# Patient Record
Sex: Female | Born: 1960 | Race: White | Hispanic: No | Marital: Married | Smoking: Former smoker
Health system: Southern US, Community
[De-identification: ages and names within clinical notes are randomized; demographics above are authoritative.]

## PROBLEM LIST (undated history)

## (undated) DIAGNOSIS — Z9989 Dependence on other enabling machines and devices: Secondary | ICD-10-CM

## (undated) DIAGNOSIS — T7840XA Allergy, unspecified, initial encounter: Secondary | ICD-10-CM

## (undated) DIAGNOSIS — E785 Hyperlipidemia, unspecified: Secondary | ICD-10-CM

## (undated) DIAGNOSIS — E119 Type 2 diabetes mellitus without complications: Secondary | ICD-10-CM

## (undated) DIAGNOSIS — J45909 Unspecified asthma, uncomplicated: Secondary | ICD-10-CM

## (undated) DIAGNOSIS — G4733 Obstructive sleep apnea (adult) (pediatric): Secondary | ICD-10-CM

## (undated) DIAGNOSIS — E7800 Pure hypercholesterolemia, unspecified: Secondary | ICD-10-CM

## (undated) DIAGNOSIS — R300 Dysuria: Secondary | ICD-10-CM

## (undated) HISTORY — PX: CHOLECYSTECTOMY: SHX55

## (undated) HISTORY — DX: Type 2 diabetes mellitus without complications: E11.9

## (undated) HISTORY — DX: Dependence on other enabling machines and devices: Z99.89

## (undated) HISTORY — DX: Allergy, unspecified, initial encounter: T78.40XA

## (undated) HISTORY — PX: EXPLORATION POST OPERATIVE OPEN HEART: SHX5061

## (undated) HISTORY — PX: KNEE ARTHROSCOPY AND ARTHROTOMY: SUR84

## (undated) HISTORY — PX: APPENDECTOMY: SHX54

## (undated) HISTORY — DX: Hyperlipidemia, unspecified: E78.5

## (undated) HISTORY — DX: Unspecified asthma, uncomplicated: J45.909

## (undated) HISTORY — DX: Obstructive sleep apnea (adult) (pediatric): G47.33

## (undated) HISTORY — PX: TUBAL LIGATION: SHX77

## (undated) MED FILL — OZEMPIC 1MG/DOSE SOPN (3ML=1BOX): 2 MG/1.5ML | 84 days supply | Qty: 9 | Fill #1 | Status: AC

## (undated) MED FILL — EZETIMIBE 10MG TABS: 10 MG | 90 days supply | Qty: 90 | Fill #1 | Status: AC

## (undated) MED FILL — LOSARTAN POTASSIUM 100MG TABS: 100 MG | 90 days supply | Qty: 90 | Fill #0 | Status: AC

## (undated) MED FILL — CIPROFLOXACIN HCL 500MG TABS: 500 MG | 10 days supply | Qty: 20 | Fill #0 | Status: AC

## (undated) MED FILL — LOSARTAN POTASSIUM 50MG TABS: 50 MG | 30 days supply | Qty: 30 | Fill #0 | Status: AC

## (undated) MED FILL — LOSARTAN POTASSIUM 100MG TABS: 100 MG | 30 days supply | Qty: 30 | Fill #0 | Status: AC

## (undated) MED FILL — OZEMPIC 1MG/DOSE SOPN (3ML=1BOX): 2 MG/1.5ML | 84 days supply | Qty: 9 | Fill #0 | Status: AC

## (undated) MED FILL — EZETIMIBE 10MG TABS: 10 MG | 90 days supply | Qty: 90 | Fill #0 | Status: AC

## (undated) MED FILL — METFORMIN HCL ER 500MG TB24: 500 MG | 90 days supply | Qty: 180 | Fill #0 | Status: AC

---

## 2011-09-30 NOTE — Progress Notes (Signed)
Subjective:      Patient ID: Kristina Howard is Kristina Howard 51 y.o. female.    HPI  Patient is here for evaluation of left trigger finger.   Patient works at [employer].    Review of Systems   All other systems reviewed and are negative.        Objective:   Physical Exam   Nursing note and vitals reviewed.  Constitutional: She is oriented to person, place, and time. She appears well-developed and well-nourished.   HENT:   Head: Normocephalic and atraumatic.   Eyes: Conjunctivae and EOM are normal. Pupils are equal, round, and reactive to light. Right eye exhibits no discharge. Left eye exhibits no discharge.   Neck: Normal range of motion. Neck supple.   Cardiovascular: Normal rate and regular rhythm.    Pulmonary/Chest: Effort normal and breath sounds normal. No respiratory distress. She has no wheezes.   Musculoskeletal: Normal range of motion. She exhibits no edema and no tenderness.   Neurological: She is alert and oriented to person, place, and time. No cranial nerve deficit. Coordination normal.   Skin: Skin is warm and dry. No rash noted. No erythema.   Psychiatric: She has Sixto Bowdish normal mood and affect. Her behavior is normal. Judgment and thought content normal.     Left trigger thumb at A1 pulley.  Patient has pain and tenderness over the joint with obvious triggering and palpable nodule.    Assessment:      Trigger left thumb at A1 pulley      Plan:      A1 pulley trigger release.  All risks and benefits discussed.   Written informed consent was obtained today in the office.         I confirm that I have personally examined the patient and have reviewed the above note(s) and confirmed the key elements of history, physical exam and progress notes, portions of which were transcribed by Olena Leatherwood directly from my in-room dictation.   Electronically signed by Oleh Genin, MD on 09/30/2011 at 1:46 PM

## 2011-10-06 NOTE — Progress Notes (Signed)
Message received from pt, wants her surgery scheduled in November

## 2011-12-15 NOTE — Telephone Encounter (Signed)
Pt calling requesting a return to work for this Wednesday, states she is recovering very well from her trigger finger release. Letter written as requested, pt will pick up.

## 2011-12-23 NOTE — Progress Notes (Signed)
POST-OP:      Patient is here today s/p release of left thumb trigger Kristina Howard. Pulley on 11/20/203.      Sutures are removed today, incision is clean dry and intact.  Patient has good ROM, she is encouraged to continue using the thumb.  She no longer has any triggering or pain.    She is instructed to call with any problems or concerns, otherwise she will follow up with [place] [employer].       I confirm that I have personally examined the patient and have reviewed the above note(s) and confirmed the key elements of history, physical exam and progress notes, portions of which were transcribed by Olena Leatherwood directly from my in-room dictation.   Electronically signed by Oleh Genin, MD

## 2017-06-19 LAB — RUBELLA ANTIBODY, IGG: Rubella Antibody, IgG: 18 IU/mL

## 2017-06-22 LAB — T-SPOT TB TEST

## 2017-06-22 LAB — VARICELLA ZOSTER ANTIBODY, IGG: Varicella Zoster Ab IgG: 1.53 (ref >1.09)

## 2017-06-22 LAB — RUBEOLA ANTIBODY IGG: Measles Immune (Igg): 2.57 (ref >1.09)

## 2017-06-22 LAB — MUMPS ANTIBODY, IGG: Mumps IgG: 6.99 (ref >1.09)

## 2017-09-16 ENCOUNTER — Inpatient Hospital Stay
Admit: 2017-09-16 | Discharge: 2017-09-16 | Disposition: A | Discharge status: Home / Self Care | Payer: PRIVATE HEALTH INSURANCE | Attending: Emergency Medicine

## 2017-09-16 DIAGNOSIS — S39012A Strain of muscle, fascia and tendon of lower back, initial encounter: Secondary | ICD-10-CM

## 2017-09-16 LAB — URINALYSIS WITH REFLEX TO CULTURE
Bilirubin Urine: NEGATIVE
Glucose, Ur: NEGATIVE
Ketones, Urine: NEGATIVE
Leukocyte Esterase, Urine: NEGATIVE
Nitrite, Urine: NEGATIVE
Protein, UA: NEGATIVE
Specific Gravity, UA: 1.01 (ref 1.005–1.030)
Urine Hgb: NEGATIVE
Urobilinogen, Urine: NORMAL
pH, UA: 6 (ref 5.0–8.0)

## 2017-09-16 MED ORDER — CYCLOBENZAPRINE HCL 10 MG PO TABS
10 MG | ORAL_TABLET | Freq: Three times a day (TID) | ORAL | 0 refills | Status: AC | PRN
Start: 2017-09-16 — End: ?

## 2017-09-16 NOTE — Discharge Instructions (Signed)
Apply ice or heat.  Activity as tolerated.  May continue Advil.  Flexeril as needed.  Return for worsening pain, fever, weakness, numbness, bowel or bladder issues, or if worse in any way.

## 2017-09-16 NOTE — ED Notes (Signed)
Patient arrived to the ED for complaints of L lower back pain. States this started about 3 days ago. Denies any fever, denies any injury. States she has had some frequency that she can think of but no other urinary symptoms. States the pain is intermittent. Has been taking Ibuprofen which seems to help. Resting on stretcher, call light in reach.      Lenard LanceHeather L Lasalle Abee, RN  09/16/17 [phone]

## 2017-09-16 NOTE — ED Provider Notes (Signed)
[facility]  eMERGENCY dEPARTMENT eNCOUnter      Pt Name: Kristina CortiLisa Novosel  MRN: [id number]  Birthdate Jun 17, 1960  Date of evaluation: 09/16/2017      CHIEF COMPLAINT       Chief Complaint   Patient presents with   ??? Back Pain     left lower x3 days         HISTORY OF PRESENT ILLNESS      The patient presents with left lower back pain for the past 3 days.  She does not recall an injury but she does do a lot of lifting and bending at work.  She is also a diabetic but hasn't noticed any issues.  She has not had any known trauma.  She has no prior history of back problems.  She denies dysuria.  The pain is worse with bending and twisting.  She denies weakness or numbness.  She tried some Advil for it.  She denies bowel or bladder issues otherwise.  Nothing makes her symptoms better or worse otherwise.       REVIEW OF SYSTEMS       All systems reviewed and negative unless noted in HPI.  The patient denies fever or constitutional symptoms.    Denies vision change.   Denies any sore throat or rhinorrhea.    Denies any neck pain or stiffness.    Denies chest pain or shortness of breath.    No nausea,  vomiting or diarrhea.    Denies any dysuria.  Denies urinary frequency or hematuria.    Denies musculoskeletal injury.  Pain in left lower back.  Denies any weakness, numbness or focal neurologic deficit.    Denies any skin rash or edema.    No recent psychiatric issues.   No easy bruising or bleeding.   Denies any polyuria, polydypsia or history of immunocompromise.      PAST MEDICAL HISTORY    has a past medical history of Asthma, Diabetes mellitus (HCC), and Hypertension.    SURGICAL HISTORY      has a past surgical history that includes Cesarean section; Tubal ligation; Cardiac surgery (age 57); and Finger trigger release (12/10/11).    CURRENT MEDICATIONS       Previous Medications    CETIRIZINE (ZYRTEC) 10 MG TABLET    Take 10 mg by mouth daily.    EMPAGLIFLOZIN (JARDIANCE) 25 MG TABLET    Take 25 mg  by mouth daily    FLUTICASONE PROPIONATE, INHAL, (FLOVENT IN)    Inhale  into the lungs.    LOSARTAN (COZAAR) 25 MG TABLET    Take 25 mg by mouth daily    METFORMIN (GLUCOPHAGE) 500 MG TABLET    Take 1,000 mg by mouth 2 times daily (with meals)    SITAGLIPTIN (JANUVIA) 25 MG TABLET    Take 25 mg by mouth daily       ALLERGIES     is allergic to codeine; shellfish-derived products; and statins.    FAMILY HISTORY     has no family status information on file.      family history is not on file.    SOCIAL HISTORY      reports that she has quit smoking. She has never used smokeless tobacco. She reports that she drinks alcohol. She reports that she does not use drugs.    PHYSICAL EXAM     INITIAL VITALS:  height is 5\' 3"  (1.6 m) and weight is 81.6 kg (180  lb). Her oral temperature is 97.5 ??F (36.4 ??C). Her blood pressure is 204/98 (abnormal) and her pulse is 54. Her respiration is 15 and oxygen saturation is 100%.      Patient is alert and oriented, in no apparent distress.    HEENT is atraumatic.    Pupils are PERRL at 5 mm with normal extraocular motion.   Mucous membranes moist.    Neck is supple with no lymphadenopathy.  No JVD.  No meningismus.    Heart sounds regular rate and rhythm with no gallops, murmurs, or rubs.    Lungs clear, no wheezes, rales or rhonchi.    Abdomen: soft, nontender with no pain to palpation.  No rebound or guarding.  No CVA tenderness.  Musculoskeletal exam shows no evidence of trauma.  No pain to palpation midline of cervical, thoracic, or lumbar spine.  No skin changes.  Patient locates her pain in the left lumbar area.  No pain in the sciatic notch.  No pain with straight leg raise.  Normal stool pulses.  Skin: no rash or edema.    Neurological exam reveals cranial nerves 2 through 12 grossly intact.  Patient has equal grips and normal deep tendon reflexes.  No saddle paresthesias.  Psychiatric: no hallucinations or suicidal ideation.   Lymphatics.:  No lymphadenopathy.       DIFFERENTIAL  DIAGNOSIS/ MDM:     Lumbar strain, UA, renal colic    DIAGNOSTIC RESULTS         LABS:  Results for orders placed or performed during the hospital encounter of 09/16/17   Urinalysis Reflex to Culture   Result Value Ref Range    Color, UA YELLOW YELLOW    Turbidity UA CLEAR CLEAR    Glucose, Ur NEGATIVE NEGATIVE    Bilirubin Urine NEGATIVE NEGATIVE    Ketones, Urine NEGATIVE NEGATIVE    Specific Gravity, UA 1.010 1.005 - 1.030    Urine Hgb NEGATIVE NEGATIVE    pH, UA 6.0 5.0 - 8.0    Protein, UA NEGATIVE NEGATIVE    Urobilinogen, Urine Normal Normal    Nitrite, Urine NEGATIVE NEGATIVE    Leukocyte Esterase, Urine NEGATIVE NEGATIVE    Urinalysis Comments       Microscopic exam not performed based on chemical results unless requested in original order.    Urinalysis Comments          Urinalysis Comments       Utilizing a urinalysis as the only screening method to exclude a potential uropathogen can be unreliable in many patient populations.  Rapid screening tests are less sensitive than culture and if UTI is a clinical possibility, culture should be considered despite a negative urinalysis.         EMERGENCY DEPARTMENT COURSE:   Vitals:    Vitals:    09/16/17 0656 09/16/17 0714   BP: (!) 200/82 (!) 204/98   Pulse: 54    Resp: 15    Temp: 97.5 ??F (36.4 ??C)    TempSrc: Oral    SpO2: 100%    Weight: 81.6 kg (180 lb)    Height: 5\' 3"  (1.6 m)      -------------------------  BP: (!) 204/98, Temp: 97.5 ??F (36.4 ??C), Pulse: 54, Resp: 15      Re-evaluation Notes    I do not think that imaging is clinically indicated at this time.  The patient has no evidence of infection.  I'll treat her as a strain of her back with some Flexeril  and she may continue NSAIDs.  The patient has no neurologic deficit.  She is discharged in good condition.      FINAL IMPRESSION      1. Strain of lumbar region, initial encounter          DISPOSITION/PLAN   DISPOSITION Decision To Discharge 09/16/2017 07:25:36 AM      Condition on  Disposition    good    PATIENT REFERRED TO:  Alvina Filbert, APRN - CNP  [address]  [place] [address]  [phone]    In 1 week        DISCHARGE MEDICATIONS:  New Prescriptions    CYCLOBENZAPRINE (FLEXERIL) 10 MG TABLET    Take 1 tablet by mouth 3 times daily as needed for Muscle spasms       (Please note that portions of this note were completed with a voice recognition program.  Efforts were made to edit the dictations but occasionally words are mis-transcribed.)    Luther Bradley,, MD   Attending Emergency Physician       Luther Bradley, MD  09/16/17 [phone]

## 2017-10-15 ENCOUNTER — Ambulatory Visit
Admit: 2017-10-15 | Discharge: 2017-10-15 | Payer: PRIVATE HEALTH INSURANCE | Attending: Family Medicine | Primary: Family Medicine

## 2017-10-15 DIAGNOSIS — E119 Type 2 diabetes mellitus without complications: Secondary | ICD-10-CM

## 2017-10-15 LAB — POCT GLYCOSYLATED HEMOGLOBIN (HGB A1C): Hemoglobin A1C: 6.6 %

## 2017-10-15 MED ORDER — NYSTATIN 100000 UNIT/GM EX CREA
100000 UNIT/GM | CUTANEOUS | 2 refills | Status: AC
Start: 2017-10-15 — End: ?

## 2017-10-15 MED ORDER — OZEMPIC (1 MG/DOSE) 2 MG/1.5ML SC SOPN
2 MG/1.5ML | PEN_INJECTOR | SUBCUTANEOUS | 2 refills | Status: DC
Start: 2017-10-15 — End: 2018-03-16

## 2017-10-15 MED ORDER — FLUCONAZOLE 150 MG PO TABS
150 MG | ORAL_TABLET | Freq: Once | ORAL | 0 refills | Status: AC
Start: 2017-10-15 — End: 2017-10-15

## 2017-10-15 NOTE — Progress Notes (Signed)
[facility]  [facility]  [address]  [address]  [place] [address]  Dept: [phone]      Kristina Howard is a 57 y.o. female who presents today for follow up on her  medical conditions as noted below.      Chief Complaint   Patient presents with   . Establish Care       Patient Active Problem List:     Trigger thumb     Past Medical History:   Diagnosis Date   . Asthma    . Diabetes mellitus (HCC)    . Hypertension       Past Surgical History:   Procedure Laterality Date   . CARDIAC SURGERY  age 51    close hole    . CESAREAN SECTION     . FINGER TRIGGER RELEASE  12/10/11    left thumb   . TUBAL LIGATION       History reviewed. No pertinent family history.    Current Outpatient Medications   Medication Sig Dispense Refill   . aspirin 81 MG tablet Take 81 mg by mouth daily     . triamcinolone (KENALOG) 0.1 % cream Apply 1 g topically 2 times daily Apply topically 2 times daily. As Needed     . clobetasol (TEMOVATE) 0.05 % ointment Apply 1 each topically 2 times daily Apply topically 2 times daily. As needed for rash     . OZEMPIC, 1 MG/DOSE, 2 MG/1.5ML SOPN Inject 1 mg into the skin once a week 1.5 pen 2   . fluconazole (DIFLUCAN) 150 MG tablet Take 1 tablet by mouth once for 1 dose May repeat in 3-5 days, if symptoms persist or recur. 2 tablet 0   . nystatin (MYCOSTATIN) 100000 UNIT/GM cream Apply topically 2 times daily. 1 Tube 2   . losartan (COZAAR) 25 MG tablet Take 25 mg by mouth daily     . metFORMIN (GLUCOPHAGE) 500 MG tablet Take 1,000 mg by mouth 2 times daily (with meals)     . SITagliptin (JANUVIA) 25 MG tablet Take 25 mg by mouth daily     . cetirizine (ZYRTEC) 10 MG tablet Take 10 mg by mouth daily.     . Fluticasone Propionate, Inhal, (FLOVENT IN) Inhale  into the lungs.     . cyclobenzaprine (FLEXERIL) 10 MG tablet Take 1 tablet by mouth 3 times daily as needed for Muscle spasms (Patient not taking: Reported on 10/15/2017) 30 tablet 0     No current  facility-administered medications for this visit.      ALLERGIES:    Allergies   Allergen Reactions   . Codeine Hives   . Shellfish-Derived Products    . Statins        Social History     Tobacco Use   . Smoking status: Former Smoker     Packs/day: 1.00     Years: 15.00     Pack years: 15.00     Types: Cigarettes     Last attempt to quit: 01/20/2002     Years since quitting: 15.7   . Smokeless tobacco: Never Used   Substance Use Topics   . Alcohol use: Yes     Comment: wine every day        No results found for: LDLCALC, LDLCHOLESTEROL, HDL, BUN, CREATININE, GLUCOSE, LABA1C, LABMICR           Subjective:      HPI  He is here today as a new patient to establish care her insurance changed that she had to find a new provider she is diabetic her hemoglobin A1c today is 6.6 which is a slight improvement from before and much better than while back it was a 3  She currently is taking metformin and Januvia she tried a SGLT2 and it gave her terrible ongoing yeast infections  Her blood pressure is well controlled she has not had labs to evaluate lipids in a long time  She states overall she is feeling pretty good she does get a rash underneath her breast area she thought it is because she is allergic to the elastic  And she also occasionally gets a rash between her fingers that can like blisters she uses a steroid cream and it helps.     Review of Systems:     Constitutional: Negative for fever, appetite change and fatigue.        Family social and medical history reviewed and unchanged     HENT: Negative.  Negative for nosebleeds, trouble swallowing and neck pain.    Eyes: Negative for photophobia and visual disturbance.   Respiratory: Negative.  Negative for chest tightness and shortness of breath.    Cardiovascular: Negative.  Negative for chest pain and leg swelling.   Gastrointestinal: Negative.  Negative for abdominal pain and blood in stool.   Endocrine: Negative for cold intolerance and polyuria.   Genitourinary:  Negative for dysuria and hematuria.   Musculoskeletal: Negative.    Skin: Negative for rash.   Allergic/Immunologic: Negative.    Neurological: Negative.  Negative for dizziness, weakness and numbness.   Hematological: Negative.  Negative for adenopathy. Does not bruise/bleed easily.   Psychiatric/Behavioral: Negative for sleep disturbance, dysphoric mood and  decreased concentration. The patient is not nervous/anxious.        Objective:     Physical Exam:     Nursing note and vitals reviewed.  BP (!) 173/89   Pulse 73   Ht 5\' 3"  (1.6 m)   Wt 183 lb (83 kg)   SpO2 95%   BMI 32.42 kg/m   Constitutional: She is oriented to person, place, and time. She   appears well-developed and well-nourished.  HENT:   Head: Normocephalic and atraumatic.    Right Ear: External ear normal. Tympanic membrane is not erythematous. No middle ear effusion.    Left Ear: External ear normal. Tympanic membrane is not erythematous.  No middle ear effusion.   Nose: No mucosal edema.   Mouth/Throat: Oropharynx is clear and moist. No posterior oropharyngeal erythema.    Eyes: Conjunctivae and EOM are normal. Pupils are equal, round, and reactive to light.    Neck: Normal range of motion. Neck supple. No thyromegaly present.   Cardiovascular: Normal rate, regular rhythm and normal heart sounds.    No murmur heard.  Pulmonary/Chest: Effort normal and breath sounds normal. She has no wheezes. Shehas no rales.  Erythematous rash underneath bilateral breasts  Abdominal: Soft. Bowel sounds are normal. She exhibits no distension and no mass.  There is no tenderness. There is no rebound and no guarding.   Genitourinary/Anorectal:deferred  Musculoskeletal: Normal range of motion. She exhibits no edema or tenderness.   Lymphadenopathy: She has no cervical adenopathy.   Neurological: She is alert and oriented to person, place, and time. She has normal reflexes.   Skin: Skin is warm and dry. No rash noted.   Psychiatric: She has a normal mood and  affect. Her   behavior is normal.       Assessment:      1. Type 2 diabetes mellitus without complication, without long-term current use of insulin (HCC)    2. Essential hypertension    3. Seasonal allergic rhinitis due to pollen    4. Candidiasis    5. Well adult exam    6. Vitamin D deficiency    7. Elevated liver enzymes          Plan:      Call or return to clinic prn if these symptoms worsen or fail to improve as anticipated.  I have reviewed the instructions with the patient, answering all questions to her satisfaction.    No follow-ups on file.  Orders Placed This Encounter   Procedures   . CBC Auto Differential     Standing Status:   Future     Standing Expiration Date:   04/15/2018   . Comprehensive Metabolic Panel     Standing Status:   Future     Standing Expiration Date:   04/15/2018   . Lipid Panel     Standing Status:   Future     Standing Expiration Date:   04/15/2018     Order Specific Question:   Is Patient Fasting?/# of Hours     Answer:   yes   . T4, Free     Standing Status:   Future     Standing Expiration Date:   04/15/2018   . TSH without Reflex     Standing Status:   Future     Standing Expiration Date:   04/15/2018   . Thyroid Peroxidase Antibody     Standing Status:   Future     Standing Expiration Date:   04/15/2018   . Vitamin D 25 Hydroxy     Standing Status:   Future     Standing Expiration Date:   10/16/2018   . Microalbumin, Ur     Standing Status:   Future     Standing Expiration Date:   11/14/2017     Orders Placed This Encounter   Medications   . OZEMPIC, 1 MG/DOSE, 2 MG/1.5ML SOPN     Sig: Inject 1 mg into the skin once a week     Dispense:  1.5 pen     Refill:  2   . fluconazole (DIFLUCAN) 150 MG tablet     Sig: Take 1 tablet by mouth once for 1 dose May repeat in 3-5 days, if symptoms persist or recur.     Dispense:  2 tablet     Refill:  0   . nystatin (MYCOSTATIN) 100000 UNIT/GM cream     Sig: Apply topically 2 times daily.     Dispense:  1 Tube     Refill:  2     Discussed  additional medication and went over instructions on how to use for diabetes  She is to follow-up in the office in 3 months  And get her laboratory studies done  Electronically signed by Eyvonne Mechanic, DO on 10/15/2017 at 1:11 PM

## 2017-10-22 ENCOUNTER — Inpatient Hospital Stay: Payer: PRIVATE HEALTH INSURANCE | Primary: Family Medicine

## 2017-10-22 DIAGNOSIS — E119 Type 2 diabetes mellitus without complications: Secondary | ICD-10-CM

## 2017-10-22 LAB — CBC WITH AUTO DIFFERENTIAL
Absolute Eos #: 0.18 10*3/uL (ref 0.00–0.44)
Absolute Immature Granulocyte: <0.03 10*3/uL (ref 0.00–0.30)
Absolute Lymph #: 1.88 10*3/uL (ref 1.10–3.70)
Absolute Mono #: 0.29 10*3/uL (ref 0.10–1.20)
Basophils Absolute: 0.03 10*3/uL (ref 0.00–0.20)
Basophils: 1 % (ref 0–2)
Eosinophils %: 4 % (ref 1–4)
Hematocrit: 43.8 % (ref 36.3–47.1)
Hemoglobin: 13.7 g/dL (ref 11.9–15.1)
Immature Granulocytes: 0 %
Lymphocytes: 37 % (ref 24–43)
MCH: 30.4 pg (ref 25.2–33.5)
MCHC: 31.3 g/dL (ref 28.4–34.8)
MCV: 97.1 fL (ref 82.6–102.9)
Monocytes: 6 % (ref 3–12)
NRBC Automated: 0 per 100 WBC
RBC: 4.51 m/uL (ref 3.95–5.11)
RDW: 12.2 % (ref 11.8–14.4)
Seg Neutrophils: 53 % (ref 36–65)
Segs Absolute: 2.73 10*3/uL (ref 1.50–8.10)
WBC: 5.1 10*3/uL (ref 3.5–11.3)

## 2017-10-22 LAB — COMPREHENSIVE METABOLIC PANEL
ALT: 43 U/L — ABNORMAL HIGH (ref 5–33)
AST: 37 U/L — ABNORMAL HIGH (ref <32)
Albumin/Globulin Ratio: 1.5 (ref 1.0–2.5)
Albumin: 4.4 g/dL (ref 3.5–5.2)
Alkaline Phosphatase: 78 U/L (ref 35–104)
Anion Gap: 15 mmol/L (ref 9–17)
BUN: 14 mg/dL (ref 6–20)
CO2: 23 mmol/L (ref 20–31)
Calcium: 9.5 mg/dL (ref 8.6–10.4)
Chloride: 103 mmol/L (ref 98–107)
Creatinine: 0.7 mg/dL (ref 0.50–0.90)
GFR African American: >60 mL/min (ref >60)
GFR Non-African American: >60 mL/min (ref >60)
Glucose: 131 mg/dL — ABNORMAL HIGH (ref 70–99)
Potassium: 5 mmol/L (ref 3.7–5.3)
Sodium: 141 mmol/L (ref 135–144)
Total Bilirubin: 0.32 mg/dL (ref 0.3–1.2)
Total Protein: 7.4 g/dL (ref 6.4–8.3)

## 2017-10-22 LAB — LIPID PANEL
Chol/HDL Ratio: 4.8 (ref <5)
Cholesterol: 250 mg/dL — ABNORMAL HIGH (ref <200)
HDL: 52 mg/dL (ref >40)
LDL Cholesterol: 167 mg/dL — ABNORMAL HIGH (ref 0–130)
Triglycerides: 154 mg/dL — ABNORMAL HIGH (ref <150)

## 2017-10-22 LAB — IMMATURE CELLS: Platelet, Fluorescence: ADEQUATE 10*3/uL (ref 138–453)

## 2017-10-22 LAB — TSH: TSH: 4.91 mIU/L (ref 0.30–5.00)

## 2017-10-22 LAB — T4, FREE: Thyroxine, Free: 0.97 ng/dL (ref 0.93–1.70)

## 2017-10-22 LAB — VITAMIN D 25 HYDROXY: Vit D, 25-Hydroxy: 36.3 ng/mL (ref 30.0–100.0)

## 2017-10-22 NOTE — Other (Signed)
Elevated cholesterol, need to start on medication and recheck in 3 m. Med sent to pharmacy.It is not a statin Follow a low fat diet

## 2017-10-23 LAB — THYROID PEROXIDASE ANTIBODY: Thyroid Peroxidase (Tpo) Ab: <10 IU/mL (ref 0.0–35.0)

## 2017-10-27 ENCOUNTER — Encounter

## 2017-10-27 MED ORDER — EZETIMIBE 10 MG PO TABS
10 MG | ORAL_TABLET | Freq: Every day | ORAL | 1 refills | Status: DC
Start: 2017-10-27 — End: 2017-12-21

## 2017-10-29 NOTE — Telephone Encounter (Signed)
From: Cathleen CortiLisa Meir  To: Myer HaffStacy L Bowen, DO  Sent: 10/28/2017 11:12 AM EDT  Subject: Test Results Question    I am wondering why my test result for my cholesterol was not on my, my chart? Considering that I was asked to take a new med for it  thank you Cathleen CortiLisa Vitale.

## 2017-11-06 NOTE — Addendum Note (Signed)
Addended byTommie Raymond on: 11/06/2017 11:12 AM     Modules accepted: Orders

## 2017-12-21 ENCOUNTER — Encounter

## 2017-12-21 MED ORDER — EZETIMIBE 10 MG PO TABS
10 MG | ORAL_TABLET | Freq: Every day | ORAL | 1 refills | Status: DC
Start: 2017-12-21 — End: 2018-07-12

## 2017-12-21 NOTE — Telephone Encounter (Signed)
Misty StanleyLisa Marcussen is calling to request a refill on the following medication(s):  Requested Prescriptions     Pending Prescriptions Disp Refills   ??? ezetimibe (ZETIA) 10 MG tablet 90 tablet 1     Sig: Take 1 tablet by mouth daily       Last Visit Date (If Applicable):  10/15/2017    Next Visit Date:    Visit date not found

## 2018-03-12 ENCOUNTER — Encounter

## 2018-03-12 NOTE — Telephone Encounter (Signed)
Patient requests refill of medication listed and sent to pharmacy listed also.       Date of Last Visit:  Date of Next visit:04/09/2018      WELL CHILD Visit: Last seen:  AWV: Last seen:

## 2018-03-16 MED ORDER — SITAGLIPTIN PHOSPHATE 25 MG PO TABS
25 MG | ORAL_TABLET | Freq: Every day | ORAL | 0 refills | Status: DC
Start: 2018-03-16 — End: 2018-04-09

## 2018-03-16 MED ORDER — OZEMPIC (1 MG/DOSE) 2 MG/1.5ML SC SOPN
2 MG/1.5ML | PEN_INJECTOR | SUBCUTANEOUS | 0 refills | Status: DC
Start: 2018-03-16 — End: 2018-04-09

## 2018-03-16 NOTE — Telephone Encounter (Signed)
Last refill needs a appt

## 2018-03-25 LAB — BE WELL HEALTH SCREEN
3-OH-Cotinine: <2 ng/mL
Chol/HDL Ratio: 4.3 (ref <5)
Cholesterol: 204 mg/dL — ABNORMAL HIGH (ref <200)
Cotinine: <2 ng/mL
Glucose: 70 mg/dL (ref 70–99)
HDL: 48 mg/dL (ref >40)
LDL Cholesterol: 123 mg/dL (ref 0–130)
Nicotine: <2 ng/mL
Triglycerides: 163 mg/dL — ABNORMAL HIGH (ref <150)

## 2018-03-25 MED ORDER — METFORMIN HCL 500 MG PO TABS
500 MG | ORAL_TABLET | Freq: Two times a day (BID) | ORAL | 1 refills | Status: DC
Start: 2018-03-25 — End: 2018-04-09

## 2018-03-25 NOTE — Telephone Encounter (Signed)
lov 10/15/17

## 2018-03-30 NOTE — Telephone Encounter (Signed)
Should be 1000 mg bid or 2000 daily

## 2018-03-30 NOTE — Telephone Encounter (Signed)
I called patient no answer left VM to contact office.

## 2018-03-30 NOTE — Telephone Encounter (Signed)
Pt thought she was talking 2000 mg once daily and script written for 200mg  bid.  Is pt suppose to take 2000 once or twice a day?

## 2018-03-30 NOTE — Telephone Encounter (Signed)
Informed pharmacist

## 2018-04-09 ENCOUNTER — Ambulatory Visit
Admit: 2018-04-09 | Discharge: 2018-04-09 | Payer: PRIVATE HEALTH INSURANCE | Attending: Family Medicine | Primary: Family Medicine

## 2018-04-09 DIAGNOSIS — M25532 Pain in left wrist: Secondary | ICD-10-CM

## 2018-04-09 LAB — POCT MICROALBUMIN
Albumin Urine: 10
Albumin/Creatinine Ratio: <30
Creatinine Ur POCT: 10

## 2018-04-09 LAB — POCT GLYCOSYLATED HEMOGLOBIN (HGB A1C): Hemoglobin A1C: 5.5 %

## 2018-04-09 MED ORDER — METFORMIN HCL ER 500 MG PO TB24
500 MG | ORAL_TABLET | Freq: Every day | ORAL | 1 refills | Status: DC
Start: 2018-04-09 — End: 2018-08-19

## 2018-04-09 MED ORDER — METHYLPREDNISOLONE ACETATE 40 MG/ML IJ SUSP
40 MG/ML | Freq: Once | INTRAMUSCULAR | Status: AC
Start: 2018-04-09 — End: 2018-04-09
  Administered 2018-04-09: 15:00:00 40 mg via INTRAMUSCULAR

## 2018-04-09 MED ORDER — LOSARTAN POTASSIUM 50 MG PO TABS
50 MG | ORAL_TABLET | Freq: Every day | ORAL | 0 refills | Status: DC
Start: 2018-04-09 — End: 2018-05-26

## 2018-04-09 MED ORDER — LOSARTAN POTASSIUM 50 MG PO TABS
50 MG | ORAL_TABLET | Freq: Every day | ORAL | 0 refills | Status: DC
Start: 2018-04-09 — End: 2018-04-09

## 2018-04-09 MED ORDER — OZEMPIC (1 MG/DOSE) 2 MG/1.5ML SC SOPN
21.5 MG/1.5ML | PEN_INJECTOR | SUBCUTANEOUS | 1 refills | Status: DC
Start: 2018-04-09 — End: 2018-07-12

## 2018-04-09 NOTE — Progress Notes (Addendum)
[facility] PHYSICIANS  [facility] FAMILY MEDICINE  [address]  [address]  [place] [address]  Dept: [phone]      Kristina Howard is a 58 y.o. female who presents today for follow up on her  medical conditions as noted below.      Chief Complaint   Patient presents with   ??? 6 Month Follow-Up     DM    ??? Other     wants to discuss new CPAP machine   ??? Wrist Pain     left        Patient Active Problem List:     Trigger thumb     Past Medical History:   Diagnosis Date   ??? Asthma    ??? Diabetes mellitus (HCC)    ??? Hypertension       Past Surgical History:   Procedure Laterality Date   ??? CARDIAC SURGERY  age 52    close hole    ??? CESAREAN SECTION     ??? FINGER TRIGGER RELEASE  12/10/11    left thumb   ??? TUBAL LIGATION       History reviewed. No pertinent family history.    Current Outpatient Medications   Medication Sig Dispense Refill   ??? metFORMIN (GLUCOPHAGE-XR) 500 MG extended release tablet Take 2 tablets by mouth daily (with breakfast) 180 tablet 1   ??? losartan (COZAAR) 50 MG tablet Take 0.5 tablets by mouth daily 30 tablet 0   ??? OZEMPIC, 1 MG/DOSE, 2 MG/1.5ML SOPN Inject 1 mg into the skin once a week 6 pen 1   ??? ezetimibe (ZETIA) 10 MG tablet Take 1 tablet by mouth daily 90 tablet 1   ??? triamcinolone (KENALOG) 0.1 % cream Apply 1 g topically 2 times daily Apply topically 2 times daily. As Needed     ??? clobetasol (TEMOVATE) 0.05 % ointment Apply 1 each topically 2 times daily Apply topically 2 times daily. As needed for rash     ??? nystatin (MYCOSTATIN) 100000 UNIT/GM cream Apply topically 2 times daily. 1 Tube 2   ??? cetirizine (ZYRTEC) 10 MG tablet Take 10 mg by mouth daily.     ??? aspirin 81 MG tablet Take 81 mg by mouth daily     ??? cyclobenzaprine (FLEXERIL) 10 MG tablet Take 1 tablet by mouth 3 times daily as needed for Muscle spasms (Patient not taking: Reported on 10/15/2017) 30 tablet 0   ??? Fluticasone Propionate, Inhal, (FLOVENT IN) Inhale  into the lungs.       No current  facility-administered medications for this visit.      ALLERGIES:    Allergies   Allergen Reactions   ??? Codeine Hives   ??? Shellfish-Derived Products    ??? Statins        Social History     Tobacco Use   ??? Smoking status: Former Smoker     Packs/day: 1.00     Years: 15.00     Pack years: 15.00     Types: Cigarettes     Last attempt to quit: 01/20/2002     Years since quitting: 16.2   ??? Smokeless tobacco: Never Used   Substance Use Topics   ??? Alcohol use: Yes     Comment: wine every day        LDL Cholesterol (mg/dL)   Date Value   00/37/0488 123     HDL (mg/dL)   Date Value   89/16/9450 48  BUN (mg/dL)   Date Value   23/76/2831 14     CREATININE (mg/dL)   Date Value   51/76/1607 0.70     Glucose (mg/dL)   Date Value   37/10/6267 70     Hemoglobin A1C (%)   Date Value   04/09/2018 5.5              Subjective:      HPI  Is here today with several complaints she is having left wrist pain going on for the last several weeks is mostly on her medial wrist  Recheck a diabetes her hemoglobin A1c is 5.5 unfortunately blood pressures were high today and she did have rechecked quite glycerides which were still 163    Review of Systems:     Constitutional: Negative for fever, appetite change and fatigue.        Family social and medical history reviewed and unchanged     HENT: Negative.  Negative for nosebleeds, trouble swallowing and neck pain.    Eyes: Negative for photophobia and visual disturbance.   Respiratory: Negative.  Negative for chest tightness and shortness of breath.    Cardiovascular: Negative.  Negative for chest pain and leg swelling.   Gastrointestinal: Negative.  Negative for abdominal pain and blood in stool.   Endocrine: Negative for cold intolerance and polyuria.   Genitourinary: Negative for dysuria and hematuria.   Musculoskeletal: Negative.    Skin: Negative for rash.   Allergic/Immunologic: Negative.    Neurological: Negative.  Negative for dizziness, weakness and numbness.   Hematological: Negative.   Negative for adenopathy. Does not bruise/bleed easily.   Psychiatric/Behavioral: Negative for sleep disturbance, dysphoric mood and  decreased concentration. The patient is not nervous/anxious.        Objective:     Physical Exam:     Nursing note and vitals reviewed.  BP (!) 181/91    Pulse 73    Wt 174 lb 9.6 oz (79.2 kg)    SpO2 99%    BMI 30.93 kg/m??   Constitutional: She is oriented to person, place, and time. She   appears well-developed and well-nourished.  HENT:   Head: Normocephalic and atraumatic.    Right Ear: External ear normal. Tympanic membrane is not erythematous. No middle ear effusion.    Left Ear: External ear normal. Tympanic membrane is not erythematous.  No middle ear effusion.   Nose: No mucosal edema.   Mouth/Throat: Oropharynx is clear and moist. No posterior oropharyngeal erythema.    Eyes: Conjunctivae and EOM are normal. Pupils are equal, round, and reactive to light.    Neck: Normal range of motion. Neck supple. No thyromegaly present.   Cardiovascular: Normal rate, regular rhythm and normal heart sounds.    No murmur heard.  Pulmonary/Chest: Effort normal and breath sounds normal. She has no wheezes. Shehas no rales.   Abdominal: Soft. Bowel sounds are normal. She exhibits no distension and no mass.  There is no tenderness. There is no rebound and no guarding.   Genitourinary/Anorectal:deferred  Musculoskeletal: Normal range of motion. She exhibits no edema or  +tenderness.med left wrist    Lymphadenopathy: She has no cervical adenopathy.   Neurological: She is alert and oriented to person, place, and time. She has normal reflexes.   Skin: Skin is warm and dry. No rash noted.   Psychiatric: She has a normal mood and affect. Her   behavior is normal.       Assessment:  1. Left wrist pain    2. Type 2 diabetes mellitus without complication, without long-term current use of insulin (HCC)    3. Essential hypertension    4. Seasonal allergic rhinitis due to pollen    5. Candidiasis    6.  Well adult exam    7. Vitamin D deficiency    8. Elevated liver enzymes          Plan:      Call or return to clinic prn if these symptoms worsen or fail to improve as anticipated.  I have reviewed the instructions with the patient, answering all questions to her satisfaction.    No follow-ups on file.  Orders Placed This Encounter   Procedures   ??? POCT glycosylated hemoglobin (Hb A1C)   ??? POCT microalbumin   ??? 20610 - PR DRAIN/INJECT LARGE JOINT/BURSA     Orders Placed This Encounter   Medications   ??? metFORMIN (GLUCOPHAGE-XR) 500 MG extended release tablet     Sig: Take 2 tablets by mouth daily (with breakfast)     Dispense:  180 tablet     Refill:  1   ??? losartan (COZAAR) 50 MG tablet     Sig: Take 0.5 tablets by mouth daily     Dispense:  30 tablet     Refill:  0   ??? OZEMPIC, 1 MG/DOSE, 2 MG/1.5ML SOPN     Sig: Inject 1 mg into the skin once a week     Dispense:  6 pen     Refill:  1   ??? methylPREDNISolone acetate (DEPO-MEDROL) injection 40 mg     Joint injection procedure: Informed consent obtained.wrist left is identified and marked.  Cleansed  with betadine, sprayed with ethyl chloride.  Injected with 9-1, 9 cc of 1% lidocaine, 1 cc  DepoMedrol injected into joint space. Good hemostasis. Pt tolerated well. Dressing applied. Post injection instructions given to patient.   stop Januvia   Increase cozaar  Start fish oil  Electronically signed by Eyvonne Mechanic, DO on 04/09/2018 at 11:47 AM

## 2018-04-09 NOTE — Addendum Note (Signed)
Addended by: Marvis Moeller on: 04/09/2018 11:47 AM     Modules accepted: Orders

## 2018-04-09 NOTE — Telephone Encounter (Signed)
Pharmacy called requesting clarification on losartan dosage.  50 mg whole or half tablet?

## 2018-05-19 ENCOUNTER — Inpatient Hospital Stay: Payer: PRIVATE HEALTH INSURANCE | Primary: Family Medicine

## 2018-05-19 DIAGNOSIS — R0602 Shortness of breath: Secondary | ICD-10-CM

## 2018-05-21 LAB — COVID-19: SARS-CoV-2, PCR: NOT DETECTED

## 2018-05-21 NOTE — Telephone Encounter (Signed)
Called and left VM of Covid test results.

## 2018-05-25 NOTE — Telephone Encounter (Signed)
Kristina Howard is calling to request a refill on the following medication(s):    Last Visit Date (If Applicable):  04/09/2018    Next Visit Date:    Visit date not found    Medication Request:  Requested Prescriptions     Pending Prescriptions Disp Refills   ??? losartan (COZAAR) 50 MG tablet 30 tablet 0     Sig: Take 1 tablet by mouth daily

## 2018-05-26 MED ORDER — LOSARTAN POTASSIUM 50 MG PO TABS
50 MG | ORAL_TABLET | ORAL | 0 refills | Status: DC
Start: 2018-05-26 — End: 2018-05-31

## 2018-05-26 MED ORDER — LOSARTAN POTASSIUM 50 MG PO TABS
50 MG | ORAL_TABLET | Freq: Every day | ORAL | 0 refills | Status: DC
Start: 2018-05-26 — End: 2018-05-31
  Filled 2018-05-27: qty 30, 30d supply, fill #0

## 2018-05-26 NOTE — Telephone Encounter (Signed)
Pt last seen 04/09/18/.

## 2018-05-31 ENCOUNTER — Encounter
Admit: 2018-05-31 | Discharge: 2018-06-01 | Payer: PRIVATE HEALTH INSURANCE | Attending: Family Medicine | Primary: Family Medicine

## 2018-05-31 DIAGNOSIS — I10 Essential (primary) hypertension: Secondary | ICD-10-CM

## 2018-05-31 MED ORDER — LOSARTAN POTASSIUM 100 MG PO TABS
100 MG | ORAL_TABLET | Freq: Every day | ORAL | 5 refills | Status: DC
Start: 2018-05-31 — End: 2018-07-12
  Filled 2018-06-09: qty 30, 30d supply, fill #0

## 2018-05-31 NOTE — Telephone Encounter (Signed)
Message left for patient of negative Covid 19 results.

## 2018-05-31 NOTE — Progress Notes (Signed)
Pt came in for BP check.  Per Dr. Cathey Endow, pt is to double her Losartan.  She will take 100mg  daily.      Discussed with pt she voiced understanding.

## 2018-06-07 ENCOUNTER — Inpatient Hospital Stay: Payer: PRIVATE HEALTH INSURANCE | Primary: Family Medicine

## 2018-06-07 ENCOUNTER — Encounter

## 2018-06-07 DIAGNOSIS — R300 Dysuria: Secondary | ICD-10-CM

## 2018-06-07 NOTE — Telephone Encounter (Signed)
Needs to come in and give a urine specimen

## 2018-06-07 NOTE — Telephone Encounter (Signed)
Order put in and patient called

## 2018-06-07 NOTE — Telephone Encounter (Signed)
Left message for pt to call back and schedule lab ma or let us know if she wants an order for ua to go to the lab

## 2018-06-07 NOTE — Telephone Encounter (Signed)
Pt stated she wanted an order for ua put in so she could go to [facility].

## 2018-06-07 NOTE — Telephone Encounter (Signed)
Pt states she has a significant amount of blood in urine.  Comes out pink with a little bit of pain.  Please advise.

## 2018-06-08 LAB — URINALYSIS WITH REFLEX TO CULTURE
Bilirubin Urine: NEGATIVE
Glucose, Ur: NEGATIVE
Ketones, Urine: NEGATIVE
Nitrite, Urine: NEGATIVE
Specific Gravity, UA: 1.005 (ref 1.005–1.030)
Urobilinogen, Urine: NORMAL
pH, UA: 6 (ref 5.0–8.0)

## 2018-06-08 LAB — MICROSCOPIC URINALYSIS
RBC, UA: 20 /HPF (ref 0–4)
WBC, UA: 20 /HPF (ref 0–5)

## 2018-06-09 LAB — CULTURE, URINE

## 2018-06-09 MED ORDER — CIPROFLOXACIN HCL 500 MG PO TABS
500 MG | ORAL_TABLET | Freq: Two times a day (BID) | ORAL | 0 refills | Status: AC
Start: 2018-06-09 — End: 2018-06-18
  Filled 2018-06-09: qty 20, 10d supply, fill #0

## 2018-07-12 ENCOUNTER — Encounter

## 2018-07-12 MED ORDER — LOSARTAN POTASSIUM 100 MG PO TABS
100 MG | ORAL_TABLET | Freq: Every day | ORAL | 5 refills | Status: DC
Start: 2018-07-12 — End: 2018-08-19
  Filled 2018-07-13: qty 90, 90d supply, fill #0

## 2018-07-12 MED ORDER — EZETIMIBE 10 MG PO TABS
10 MG | ORAL_TABLET | Freq: Every day | ORAL | 1 refills | Status: AC
Start: 2018-07-12 — End: ?
  Filled 2018-07-13: qty 90, 90d supply, fill #0

## 2018-07-12 MED ORDER — OZEMPIC (1 MG/DOSE) 2 MG/1.5ML SC SOPN
2 MG/1.5ML | PEN_INJECTOR | SUBCUTANEOUS | 1 refills | Status: AC
Start: 2018-07-12 — End: 2018-08-11
  Filled 2018-07-14: qty 9, 84d supply, fill #0

## 2018-07-12 NOTE — Telephone Encounter (Signed)
lov 04/09/2018

## 2018-08-19 ENCOUNTER — Telehealth
Admit: 2018-08-19 | Discharge: 2018-08-19 | Payer: PRIVATE HEALTH INSURANCE | Attending: Family Medicine | Primary: Family Medicine

## 2018-08-19 DIAGNOSIS — E119 Type 2 diabetes mellitus without complications: Secondary | ICD-10-CM

## 2018-08-19 MED ORDER — LOSARTAN POTASSIUM 100 MG PO TABS
100 MG | ORAL_TABLET | Freq: Every day | ORAL | 0 refills | Status: AC
Start: 2018-08-19 — End: ?
  Filled 2018-09-14: qty 90, 90d supply, fill #0

## 2018-08-19 MED ORDER — METFORMIN HCL ER 500 MG PO TB24
500 MG | ORAL_TABLET | Freq: Every day | ORAL | 1 refills | Status: AC
Start: 2018-08-19 — End: ?
  Filled 2018-08-19: qty 180, 90d supply, fill #0

## 2018-08-19 MED ORDER — BLOOD GLUCOSE TEST VI STRP
ORAL_STRIP | 0 refills | Status: AC
Start: 2018-08-19 — End: ?

## 2018-08-19 NOTE — Progress Notes (Signed)
08/19/2018    TELEHEALTH EVALUATION -- Audio/Visual (During COVID-19 public health emergency)    HPI:    Kristina Howard (DOB:  12-04-1960) has requested an audio/video evaluation for the following concern(s):    Patient is being seen today in virtual visit evaluation.  She is going to be moving at the end of the month and wanted to make sure she has refills until she finds a new physician.  She states overall she is doing fine she is not really having any complaints or problems    Review of Systems    Prior to Visit Medications    Medication Sig Taking? Authorizing Provider   metFORMIN (GLUCOPHAGE-XR) 500 MG extended release tablet Take 2 tablets by mouth daily (with breakfast) Yes Timmey Lamba L Zela Sobieski, DO   losartan (COZAAR) 100 MG tablet Take 1 tablet by mouth daily Yes Michiko Lineman L Edgard Debord, DO   blood glucose monitor strips Test 2 times a day & as needed for symptoms of irregular blood glucose. Dispense sufficient amount for indicated testing frequency plus additional to accommodate PRN testing needs. Yes Pasha Broad L Violette Morneault, DO   triamcinolone (KENALOG) 0.1 % cream Apply 1 g topically 2 times daily Apply topically 2 times daily. As Needed Yes Historical Provider, MD   nystatin (MYCOSTATIN) 100000 UNIT/GM cream Apply topically 2 times daily. Yes Naylee Frankowski L Elodia Haviland, DO   cetirizine (ZYRTEC) 10 MG tablet Take 10 mg by mouth daily. Yes Historical Provider, MD   Fluticasone Propionate, Inhal, (FLOVENT IN) Inhale  into the lungs. Yes Historical Provider, MD   ezetimibe (ZETIA) 10 MG tablet Take 1 tablet by mouth daily  Patient not taking: Reported on 08/19/2018  Shayley Medlin L Nakaya Mishkin, DO   aspirin 81 MG tablet Take 81 mg by mouth daily  Historical Provider, MD   clobetasol (TEMOVATE) 0.05 % ointment Apply 1 each topically 2 times daily Apply topically 2 times daily. As needed for rash  Historical Provider, MD   cyclobenzaprine (FLEXERIL) 10 MG tablet Take 1 tablet by mouth 3 times daily as needed for Muscle spasms  Patient not taking: Reported on  10/15/2017  Luther BradleyEileen F Baker, MD       Social History     Tobacco Use   ??? Smoking status: Former Smoker     Packs/day: 1.00     Years: 15.00     Pack years: 15.00     Types: Cigarettes     Last attempt to quit: 01/20/2002     Years since quitting: 16.5   ??? Smokeless tobacco: Never Used   Substance Use Topics   ??? Alcohol use: Yes     Comment: wine every day   ??? Drug use: Never        Allergies   Allergen Reactions   ??? Codeine Hives   ??? Shellfish-Derived Products    ??? Statins    ,   Past Medical History:   Diagnosis Date   ??? Asthma    ??? Diabetes mellitus (HCC)    ??? Hypertension    ,   Past Surgical History:   Procedure Laterality Date   ??? CARDIAC SURGERY  age 61    close hole    ??? CESAREAN SECTION     ??? FINGER TRIGGER RELEASE  12/10/11    left thumb   ??? TUBAL LIGATION     ,   Social History     Tobacco Use   ??? Smoking status: Former Smoker     Packs/day: 1.00  Years: 15.00     Pack years: 15.00     Types: Cigarettes     Last attempt to quit: 01/20/2002     Years since quitting: 16.5   ??? Smokeless tobacco: Never Used   Substance Use Topics   ??? Alcohol use: Yes     Comment: wine every day   ??? Drug use: Never   , History reviewed. No pertinent family history.    PHYSICAL EXAMINATION:  [ INSTRUCTIONS:  "[x] " Indicates a positive item  "[] " Indicates a negative item  -- DELETE ALL ITEMS NOT EXAMINED]  Vital Signs: (As obtained by patient/caregiver or practitioner observation)    Blood pressure-  Heart rate-    Respiratory rate-    Temperature-  Pulse oximetry-     Constitutional: [x]  Appears well-developed and well-nourished [x]  No apparent distress      []  Abnormal-   Mental status  [x]  Alert and awake  [x]  Oriented to person/place/time [x] Able to follow commands      Eyes:  EOM    [x]   Normal  []  Abnormal-  Sclera  [x]   Normal  []  Abnormal -         Discharge [x]   None visible  []  Abnormal -    HENT:   [x]  Normocephalic, atraumatic.  []  Abnormal   []  Mouth/Throat: Mucous membranes are moist.     External Ears [x]  Normal  []   Abnormal-     Neck: [x]  No visualized mass     Pulmonary/Chest: [x]  Respiratory effort normal.  [x]  No visualized signs of difficulty breathing or respiratory distress        []  Abnormal-      Musculoskeletal:   [x]  Normal gait with no signs of ataxia         [x]  Normal range of motion of neck        []  Abnormal-       Neurological:        [x]  No Facial Asymmetry (Cranial nerve 7 motor function) (limited exam to video visit)          [x]  No gaze palsy        []  Abnormal-         Skin:        []  No significant exanthematous lesions or discoloration noted on facial skin         []  Abnormal-            Psychiatric:       [x]  Normal Affect [x]  No Hallucinations        []  Abnormal-     Other pertinent observable physical exam findings-     ASSESSMENT/PLAN:  1. Type 2 diabetes mellitus without complication, without long-term current use of insulin ([place])    2. Essential hypertension    3. Hypercholesteremia    4. Seasonal allergic rhinitis due to pollen    5. Vitamin D deficiency      No orders of the defined types were placed in this encounter.    Requested Prescriptions     Signed Prescriptions Disp Refills   ??? metFORMIN (GLUCOPHAGE-XR) 500 MG extended release tablet 180 tablet 1     Sig: Take 2 tablets by mouth daily (with breakfast)   ??? losartan (COZAAR) 100 MG tablet 90 tablet 0     Sig: Take 1 tablet by mouth daily   ??? blood glucose monitor strips 100 strip 0     Sig: Test 2 times a day & as  needed for symptoms of irregular blood glucose. Dispense sufficient amount for indicated testing frequency plus additional to accommodate PRN testing needs.         Return if symptoms worsen or fail to improve.    Kristina CortiLisa Howard is a 58 y.o. female being evaluated by a Virtual Visit (video visit) encounter to address concerns as mentioned above.  A caregiver was present when appropriate. Due to this being a Scientist, research (medical)TeleHealth encounter (During COVID-19 public health emergency), evaluation of the following organ systems was limited:  Vitals/Constitutional/EENT/Resp/CV/GI/GU/MS/Neuro/Skin/Heme-Lymph-Imm.  Pursuant to the emergency declaration under the [facility] Act and the [employer] Emergencies Act, 1135 waiver authority and the [employer]onavirus Preparedness and [employer] Supplemental Appropriations Act, this Virtual Visit was conducted with patient's (and/or legal guardian's) consent, to reduce the patient's risk of exposure to COVID-19 and provide necessary medical care.  The patient (and/or legal guardian) has also been advised to contact this office for worsening conditions or problems, and seek emergency medical treatment and/or call 911 if deemed necessary.     Patient identification was verified at the start of the visit: Yes    Total time spent on this encounter: Not billed by time    Services were provided through a video synchronous discussion virtually to substitute for in-person clinic visit. Patient and provider were located at their individual homes.    --Eyvonne MechanicStacy L Quadre Bristol, DO on 08/19/2018 at 10:20 AM    An electronic signature was used to authenticate this note.

## 2018-09-14 MED FILL — EZETIMIBE 10MG TABS: 10 mg | 90 days supply | Qty: 90 | Fill #1 | Status: AC

## 2018-09-20 MED FILL — OZEMPIC 1MG/DOSE SOPN (3ML=1BOX): 2 MG/1.5ML | 84 days supply | Qty: 9 | Fill #1 | Status: AC

## 2018-12-31 ENCOUNTER — Ambulatory Visit: Payer: Federal, State, Local not specified - PPO | Admitting: Family Medicine

## 2018-12-31 ENCOUNTER — Other Ambulatory Visit: Payer: Self-pay

## 2018-12-31 ENCOUNTER — Encounter: Payer: Self-pay | Admitting: Family Medicine

## 2018-12-31 ENCOUNTER — Encounter (INDEPENDENT_AMBULATORY_CARE_PROVIDER_SITE_OTHER): Payer: Self-pay

## 2018-12-31 VITALS — BP 130/70 | HR 71 | Temp 98.5°F | Ht 63.0 in | Wt 161.4 lb

## 2018-12-31 DIAGNOSIS — Z124 Encounter for screening for malignant neoplasm of cervix: Secondary | ICD-10-CM | POA: Diagnosis not present

## 2018-12-31 DIAGNOSIS — I10 Essential (primary) hypertension: Secondary | ICD-10-CM | POA: Diagnosis not present

## 2018-12-31 DIAGNOSIS — R7989 Other specified abnormal findings of blood chemistry: Secondary | ICD-10-CM

## 2018-12-31 DIAGNOSIS — Z1231 Encounter for screening mammogram for malignant neoplasm of breast: Secondary | ICD-10-CM

## 2018-12-31 DIAGNOSIS — E119 Type 2 diabetes mellitus without complications: Secondary | ICD-10-CM

## 2018-12-31 DIAGNOSIS — E785 Hyperlipidemia, unspecified: Secondary | ICD-10-CM

## 2018-12-31 MED ORDER — METFORMIN HCL 500 MG PO TABS: 1000.0000 mg | ORAL_TABLET | Freq: Every day | ORAL | 2 refills | Status: DC

## 2018-12-31 MED ORDER — LOSARTAN POTASSIUM 100 MG PO TABS: 100.0000 mg | ORAL_TABLET | Freq: Every day | ORAL | 2 refills | Status: DC

## 2018-12-31 MED ORDER — EZETIMIBE 10 MG PO TABS: 10.0000 mg | ORAL_TABLET | Freq: Every day | ORAL | 2 refills | Status: DC

## 2018-12-31 NOTE — Patient Instructions (Addendum)
  I appreciate the opportunity to provide you with care for your health and wellness. Today we discussed: establish care  Follow up: Late March for Annual -vision, no pap   Labs 1 week before at [facility] all your medications as ordered. Call and or message on MyChart about DM injectable medication  I hope you have a wonderful, happy, safe, and healthy Holiday Season! See you in the New Year :)  Please continue to practice social distancing to keep you, your family, and our community safe.  If you must go out, please wear a mask and practice good handwashing.  It was a pleasure to see you and I look forward to continuing to work together on your health and well-being. Please do not hesitate to call the office if you need care or have questions about your care.  Have a wonderful day and week. With Gratitude, Cherly Beach, DNP, AGNP-BC

## 2018-12-31 NOTE — Progress Notes (Signed)
Subjective:     Patient ID: Sophia Robbins, female   DOB: 1960-10-18, 58 y.o.   MRN: [id number]  Sophia Robbins presents for Establish Care and Diabetes (Type II )  Sophia Robbins is a 59 year old female patient who is recently moved down from [place].  Her husband was recently became a patient of mine as well.  She presents today to establish care so that she can have a PCP locally.  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  Reports that she has lost 25 pounds in the last year and a half.  Is trying to take better care of herself and eat overall better diet.  She has an extensive history that includes but is not limited to obstructive sleep apnea with CPAP use, kidney stones, murmur, rash, diabetes, high cholesterol.  Reports getting her flu shot at work.  Is in need of mammogram and Pap smear.  Unsure of HIV or hepatitis C screening.  Will need to get release form to review her records.  Reports that her last annual visit was in March.  She would like to get that set up and get her lab work ordered for that time as well.  Does see a dentist and eye doctor regularly.  Today patient denies signs and symptoms of COVID 19 infection including fever, chills, cough, shortness of breath, and headache.  Past Medical, Surgical, Social History, Allergies, and Medications have been Reviewed. Past Medical History:  Diagnosis Date  . Allergy    Past Surgical History:  Procedure Laterality Date  . CESAREAN SECTION    . CHOLECYSTECTOMY    . EXPLORATION POST OPERATIVE OPEN HEART    . KNEE ARTHROSCOPY AND ARTHROTOMY Left   . TUBAL LIGATION     Social History   Socioeconomic History  . Marital status: Married    Spouse name: Not on file  . Number of children: Not on file  . Years of education: Not on file  . Highest education level: Not on file  Occupational History  . Occupation: Dietitian    Comment: [employer]  Tobacco Use  . Smoking status: Not on file  . Smokeless tobacco:  Never Used  Substance and Sexual Activity  . Alcohol use: Yes    Alcohol/week: 1.0 standard drinks    Types: 1 Glasses of wine per week    Comment: occasional  . Drug use: Not Currently  . Sexual activity: Yes  Other Topics Concern  . Not on file  Social History Narrative  . Not on file   Social Determinants of Health   Financial Resource Strain:   . Difficulty of Paying Living Expenses: Not on file  Food Insecurity:   . Worried About Charity fundraiser in the Last Year: Not on file  . Ran Out of Food in the Last Year: Not on file  Transportation Needs:   . Lack of Transportation (Medical): Not on file  . Lack of Transportation (Non-Medical): Not on file  Physical Activity:   . Days of Exercise per Week: Not on file  . Minutes of Exercise per Session: Not on file  Stress:   . Feeling of Stress : Not on file  Social Connections:   . Frequency of Communication with Friends and Family: Not on file  . Frequency of Social Gatherings with Friends and Family: Not on file  . Attends Religious Services: Not on file  . Active Member of Clubs or Organizations: Not on file  .  Attends Banker Meetings: Not on file  . Marital Status: Not on file  Intimate Partner Violence:   . Fear of Current or Ex-Partner: Not on file  . Emotionally Abused: Not on file  . Physically Abused: Not on file  . Sexually Abused: Not on file    Outpatient Encounter Medications as of 12/31/2018  Medication Sig  . cetirizine (ZYRTEC) 10 MG tablet Take 10 mg by mouth daily.  . clobetasol ointment (TEMOVATE) 0.05 % Apply 1 application topically 2 (two) times daily as needed.  . cyclobenzaprine (FLEXERIL) 10 MG tablet Take 10 mg by mouth 3 (three) times daily as needed for muscle spasms.  Marland Kitchen ezetimibe (ZETIA) 10 MG tablet Take 10 mg by mouth daily.  . fluticasone (FLOVENT HFA) 110 MCG/ACT inhaler Inhale into the lungs 2 (two) times daily.  Marland Kitchen losartan (COZAAR) 100 MG tablet Take 100 mg by mouth  daily.  . metFORMIN (GLUCOPHAGE) 500 MG tablet Take 1,000 mg by mouth daily with breakfast.  . nystatin cream (MYCOSTATIN) Apply 1 application topically 2 (two) times daily.  Marland Kitchen triamcinolone cream (KENALOG) 0.1 % Apply 1 application topically 2 (two) times daily as needed.   No facility-administered encounter medications on file as of 12/31/2018.   Allergies  Allergen Reactions  . Codeine Hives  . Shellfish Allergy   . Statins     Review of Systems  Constitutional: Negative.  Negative for chills and fever.  HENT: Negative.   Eyes: Negative.   Respiratory: Negative.   Cardiovascular: Negative.   Gastrointestinal: Negative.   Endocrine: Negative.   Genitourinary: Negative.   Musculoskeletal: Negative.   Skin: Negative.   Allergic/Immunologic: Negative.   Neurological: Negative.   Hematological: Negative.   Psychiatric/Behavioral: Negative.   All other systems reviewed and are negative.      Objective:     BP 130/70 (BP Location: Left Arm, Patient Position: Sitting, Cuff Size: Normal)   Pulse 71   Temp 98.5 F (36.9 C) (Oral)   Ht 5\' 3"  (1.6 m)   Wt 161 lb 6.4 oz (73.2 kg)   SpO2 99%   BMI 28.59 kg/m   Physical Exam Vitals and nursing note reviewed.  Constitutional:      Appearance: Normal appearance. She is well-developed, well-groomed and overweight.  HENT:     Head: Normocephalic and atraumatic.     Right Ear: External ear normal.     Left Ear: External ear normal.     Nose: Nose normal.     Mouth/Throat:     Mouth: Mucous membranes are moist.     Pharynx: Oropharynx is clear.  Eyes:     General:        Right eye: No discharge.        Left eye: No discharge.     Conjunctiva/sclera: Conjunctivae normal.  Cardiovascular:     Rate and Rhythm: Normal rate and regular rhythm.     Pulses: Normal pulses.     Heart sounds: Normal heart sounds.  Pulmonary:     Effort: Pulmonary effort is normal.     Breath sounds: Normal breath sounds.  Musculoskeletal:         General: Normal range of motion.     Cervical back: Normal range of motion and neck supple.  Skin:    General: Skin is warm.  Neurological:     General: No focal deficit present.     Mental Status: She is alert and oriented to person, place, and time.  Psychiatric:  Attention and Perception: Attention normal.        Mood and Affect: Mood normal.        Speech: Speech normal.        Behavior: Behavior normal. Behavior is cooperative.        Thought Content: Thought content normal.        Cognition and Memory: Cognition normal.        Judgment: Judgment normal.        Assessment and Plan        1. Diabetes mellitus without complication (HCC) Unsure of last A1c at this time.  Is on cholesterol medication.  Has allergy to statins as per her.  Will need to get updated labs.  Provided refills on all medications needed.  Strongly encouraged her to make sure that she is monitoring her blood sugars and keeping them in check.  Will be following these more closely once labs are resulted.  Additionally need to get records from previous office as well.  Reports that she is up-to-date on eye exam.  - losartan (COZAAR) 100 MG tablet; Take 1 tablet (100 mg total) by mouth daily.  Dispense: 90 tablet; Refill: 2 - metFORMIN (GLUCOPHAGE) 500 MG tablet; Take 2 tablets (1,000 mg total) by mouth daily with breakfast.  Dispense: 90 tablet; Refill: 2 - ezetimibe (ZETIA) 10 MG tablet; Take 1 tablet (10 mg total) by mouth daily.  Dispense: 90 tablet; Refill: 2 - COMPLETE METABOLIC PANEL WITH GFR - CBC - Hemoglobin A1c - Lipid panel - TSH - VITAMIN D 25 Hydroxy - Microalbumin / creatinine urine ratio  2. Essential hypertension Baseline control at this time.  Provided with refill.  Labs ordered adjustment will be provided as needed.  Strongly encouraged to continue the lifestyle changes that she has made with weight loss regarding diet and exercise.  - losartan (COZAAR) 100 MG tablet; Take 1  tablet (100 mg total) by mouth daily.  Dispense: 90 tablet; Refill: 2 - COMPLETE METABOLIC PANEL WITH GFR - CBC - TSH  3. Hyperlipidemia, unspecified hyperlipidemia type Maintain heart healthy low-fat diet.  Refills provided labs ordered  - ezetimibe (ZETIA) 10 MG tablet; Take 1 tablet (10 mg total) by mouth daily.  Dispense: 90 tablet; Refill: 2 - Lipid panel  4. Encounter for screening for malignant neoplasm of cervix Ordered - Ambulatory referral to Obstetrics / Gynecology  5. Encounter for screening mammogram for malignant neoplasm of breast Ordered - MM 3D SCREEN BREAST BILATERAL; Future         Follow up: Late March for annual labs 1 week before no Pap  Freddy FinnerHannah M. Kenyan Karnes, DNP, AGNP-BC [facility] [address], [address] [place], [place] [address] Office Hours: Mon-Thurs 8 am-5 pm; Fri 8 am-12 pm Office Phone:  [phone]  Office Fax: [phone]

## 2019-01-03 ENCOUNTER — Telehealth: Payer: Self-pay | Admitting: *Deleted

## 2019-01-03 NOTE — Telephone Encounter (Signed)
Pt called said the medication she was missing at fridays appt was Ozempic 1 mg

## 2019-01-03 NOTE — Telephone Encounter (Signed)
Chart updated

## 2019-01-04 ENCOUNTER — Other Ambulatory Visit: Payer: Self-pay | Admitting: Family Medicine

## 2019-01-04 DIAGNOSIS — E119 Type 2 diabetes mellitus without complications: Secondary | ICD-10-CM

## 2019-01-04 MED ORDER — OZEMPIC (1 MG/DOSE) 2 MG/1.5ML ~~LOC~~ SOPN: 1.0000 mg | PEN_INJECTOR | SUBCUTANEOUS | 3 refills | Status: DC

## 2019-01-04 NOTE — Telephone Encounter (Signed)
Left generic message for patient asking for a call back. Need to know how patient is taking the Ozempic.

## 2019-01-04 NOTE — Telephone Encounter (Signed)
Pt was calling back she is also needs this medication called into the pharmacy as she is out of the medication

## 2019-01-04 NOTE — Telephone Encounter (Signed)
Will you refill the patients Ozempic? They come in pens. One pen last 2 weeks. She take 1mg  weekly.

## 2019-01-04 NOTE — Telephone Encounter (Signed)
Pt called back said she was at work would like a call back

## 2019-01-19 ENCOUNTER — Ambulatory Visit: Payer: Federal, State, Local not specified - PPO | Admitting: Obstetrics and Gynecology

## 2019-01-19 ENCOUNTER — Other Ambulatory Visit (HOSPITAL_COMMUNITY)
Admission: RE | Admit: 2019-01-19 | Discharge: 2019-01-19 | Disposition: A | Discharge status: Home / Self Care | Payer: Federal, State, Local not specified - PPO | Source: Ambulatory Visit | Attending: Obstetrics and Gynecology | Admitting: Obstetrics and Gynecology

## 2019-01-19 ENCOUNTER — Other Ambulatory Visit: Payer: Self-pay

## 2019-01-19 ENCOUNTER — Encounter: Payer: Self-pay | Admitting: Obstetrics and Gynecology

## 2019-01-19 VITALS — BP 149/86 | HR 62 | Ht 63.0 in | Wt 162.4 lb

## 2019-01-19 DIAGNOSIS — Z01419 Encounter for gynecological examination (general) (routine) without abnormal findings: Secondary | ICD-10-CM | POA: Insufficient documentation

## 2019-01-19 DIAGNOSIS — Z1211 Encounter for screening for malignant neoplasm of colon: Secondary | ICD-10-CM | POA: Diagnosis not present

## 2019-01-19 DIAGNOSIS — Z1212 Encounter for screening for malignant neoplasm of rectum: Secondary | ICD-10-CM

## 2019-01-19 LAB — HEMOCCULT GUIAC POC 1CARD (OFFICE): Fecal Occult Blood, POC: NEGATIVE

## 2019-01-19 NOTE — Progress Notes (Signed)
Patient ID: Sophia Robbins, female   DOB: 28-Jan-1960, 58 y.o.   MRN: [id number]  Assessment:  Annual Gyn Exam Plan:  1. Pap smear done, next pap due 5 years 2. Return annually or prn for any gyn concerns 3. Labs by [facility] 3    Annual mammogram advised after age 57 Subjective:  Sophia Robbins is a 58 y.o. female NEW PT REFERRED BY [facility] G1P1001 who presents for annual exam. No LMP recorded. Patient is postmenopausal. The patient has no complaints today but needs a pap. She has never had an abnormal pap in the past and her last pap was about a year and a half ago. She has had one c section and gets regular mammograms. Hx of open heart surgery at age 31.  The following portions of the patient's history were reviewed and updated as appropriate: allergies, current medications, past family history, past medical history, past social history, past surgical history and problem list. Past Medical History:  Diagnosis Date  . Allergy   . Asthma   . Diabetes mellitus without complication (HCC)   . Hyperlipidemia   . OSA on CPAP     Past Surgical History:  Procedure Laterality Date  . CESAREAN SECTION    . CHOLECYSTECTOMY    . EXPLORATION POST OPERATIVE OPEN HEART    . KNEE ARTHROSCOPY AND ARTHROTOMY Left   . TUBAL LIGATION       Current Outpatient Medications:  .  aspirin EC 81 MG tablet, Take 81 mg by mouth daily., Disp: , Rfl:  .  cetirizine (ZYRTEC) 10 MG tablet, Take 10 mg by mouth daily., Disp: , Rfl:  .  clobetasol ointment (TEMOVATE) 0.05 %, Apply 1 application topically 2 (two) times daily as needed., Disp: , Rfl:  .  ezetimibe (ZETIA) 10 MG tablet, Take 1 tablet (10 mg total) by mouth daily., Disp: 90 tablet, Rfl: 2 .  fluticasone (FLOVENT HFA) 110 MCG/ACT inhaler, Inhale into the lungs as needed. , Disp: , Rfl:  .  losartan (COZAAR) 100 MG tablet, Take 1 tablet (100 mg total) by mouth daily., Disp: 90 tablet, Rfl: 2 .  metFORMIN (GLUCOPHAGE) 500 MG tablet, Take 2 tablets (1,000 mg total)  by mouth daily with breakfast., Disp: 90 tablet, Rfl: 2 .  nystatin cream (MYCOSTATIN), Apply 1 application topically as needed. , Disp: , Rfl:  .  Semaglutide, 1 MG/DOSE, (OZEMPIC, 1 MG/DOSE,) 2 MG/1.5ML SOPN, Inject 1 mg into the skin once a week., Disp: 1.5 mL, Rfl: 3 .  triamcinolone cream (KENALOG) 0.1 %, Apply 1 application topically 2 (two) times daily as needed., Disp: , Rfl:   Review of Systems Constitutional: negative Gastrointestinal: negative Genitourinary: negative  Objective:  BP (!) 149/86 (BP Location: Right Arm, Patient Position: Sitting, Cuff Size: Normal)   Pulse 62   Ht 5\' 3"  (1.6 m)   Wt 162 lb 6.4 oz (73.7 kg)   BMI 28.77 kg/m    BMI: Body mass index is 28.77 kg/m.  General Appearance: Alert, appropriate appearance for age. No acute distress HEENT: Grossly normal Neck / Thyroid:  Cardiovascular: RRR; normal S1, S2, no murmur Lungs: CTA bilaterally Back: No CVAT Breast Exam: chronic small 8x8 mm nevus on right upper breast Gastrointestinal: Soft, non-tender, no masses or organomegaly Pelvic Exam: Uterus: well supported, healed c section scar Vagina: normal External genitalia: normal Rectovaginal: not indicated and guaiac negative stool obtained Lymphatic Exam: Non-palpable nodes in neck, clavicular, axillary, or inguinal regions  Skin: no rash or abnormalities Neurologic: Normal  gait and speech, no tremor  Psychiatric: Alert and oriented, appropriate affect.  Urinalysis:Not done  By signing my name below, I, De Burrs, attest that this documentation has been prepared under the direction and in the presence of Jonnie Kind, MD. Electronically Signed: De Burrs, Medical Scribe. 01/19/19. 9:13 AM.  I personally performed the services described in this documentation, which was SCRIBED in my presence. The recorded information has been reviewed and considered accurate. It has been edited as necessary during review. Jonnie Kind, MD

## 2019-01-25 LAB — CYTOLOGY - PAP
Comment: NEGATIVE
Diagnosis: NEGATIVE
High risk HPV: NEGATIVE

## 2019-03-22 ENCOUNTER — Other Ambulatory Visit: Payer: Self-pay

## 2019-03-22 DIAGNOSIS — E119 Type 2 diabetes mellitus without complications: Secondary | ICD-10-CM

## 2019-03-22 MED ORDER — METFORMIN HCL 500 MG PO TABS: 1000.0000 mg | ORAL_TABLET | Freq: Every day | ORAL | 0 refills | Status: DC

## 2019-04-07 DIAGNOSIS — E785 Hyperlipidemia, unspecified: Secondary | ICD-10-CM | POA: Diagnosis not present

## 2019-04-07 DIAGNOSIS — I10 Essential (primary) hypertension: Secondary | ICD-10-CM | POA: Diagnosis not present

## 2019-04-07 DIAGNOSIS — E559 Vitamin D deficiency, unspecified: Secondary | ICD-10-CM | POA: Diagnosis not present

## 2019-04-07 DIAGNOSIS — E119 Type 2 diabetes mellitus without complications: Secondary | ICD-10-CM | POA: Diagnosis not present

## 2019-04-08 LAB — CBC
HCT: 42.2 % (ref 35.0–45.0)
Hemoglobin: 14.3 g/dL (ref 11.7–15.5)
MCH: 30.9 pg (ref 27.0–33.0)
MCHC: 33.9 g/dL (ref 32.0–36.0)
MCV: 91.1 fL (ref 80.0–100.0)
MPV: 10.9 fL (ref 7.5–12.5)
Platelets: 230 10*3/uL (ref 140–400)
RBC: 4.63 10*6/uL (ref 3.80–5.10)
RDW: 12.2 % (ref 11.0–15.0)
WBC: 4.3 10*3/uL (ref 3.8–10.8)

## 2019-04-08 LAB — COMPLETE METABOLIC PANEL WITH GFR
AG Ratio: 2.2 (calc) (ref 1.0–2.5)
ALT: 18 U/L (ref 6–29)
AST: 20 U/L (ref 10–35)
Albumin: 4.7 g/dL (ref 3.6–5.1)
Alkaline phosphatase (APISO): 58 U/L (ref 37–153)
BUN: 12 mg/dL (ref 7–25)
CO2: 30 mmol/L (ref 20–32)
Calcium: 10.1 mg/dL (ref 8.6–10.4)
Chloride: 103 mmol/L (ref 98–110)
Creat: 0.9 mg/dL (ref 0.50–0.99)
GFR, Est African American: 81 mL/min/{1.73_m2} (ref >=60)
GFR, Est Non African American: 69 mL/min/{1.73_m2} (ref >=60)
Globulin: 2.1 g/dL (calc) (ref 1.9–3.7)
Glucose, Bld: 102 mg/dL — ABNORMAL HIGH (ref 65–99)
Potassium: 4.5 mmol/L (ref 3.5–5.3)
Sodium: 141 mmol/L (ref 135–146)
Total Bilirubin: 0.5 mg/dL (ref 0.2–1.2)
Total Protein: 6.8 g/dL (ref 6.1–8.1)

## 2019-04-08 LAB — LIPID PANEL
Cholesterol: 232 mg/dL — ABNORMAL HIGH (ref <200)
HDL: 64 mg/dL (ref >=50)
LDL Cholesterol (Calc): 147 mg/dL (calc) — ABNORMAL HIGH
Non-HDL Cholesterol (Calc): 168 mg/dL (calc) — ABNORMAL HIGH (ref <130)
Total CHOL/HDL Ratio: 3.6 (calc) (ref <5.0)
Triglycerides: 97 mg/dL (ref <150)

## 2019-04-08 LAB — MICROALBUMIN / CREATININE URINE RATIO
Creatinine, Urine: 185 mg/dL (ref 20–275)
Microalb Creat Ratio: 4 mcg/mg creat (ref <30)
Microalb, Ur: 0.7 mg/dL

## 2019-04-08 LAB — HEMOGLOBIN A1C
Hgb A1c MFr Bld: 5.3 % of total Hgb (ref <5.7)
Mean Plasma Glucose: 105 (calc)
eAG (mmol/L): 5.8 (calc)

## 2019-04-08 LAB — VITAMIN D 25 HYDROXY (VIT D DEFICIENCY, FRACTURES): Vit D, 25-Hydroxy: 22 ng/mL — ABNORMAL LOW (ref 30–100)

## 2019-04-08 LAB — TSH: TSH: 4.52 mIU/L — ABNORMAL HIGH (ref 0.40–4.50)

## 2019-04-08 NOTE — Addendum Note (Signed)
Addended by: Abner Greenspan on: 04/08/2019 10:54 AM   Modules accepted: Orders

## 2019-04-14 ENCOUNTER — Ambulatory Visit (INDEPENDENT_AMBULATORY_CARE_PROVIDER_SITE_OTHER): Payer: Federal, State, Local not specified - PPO | Admitting: Family Medicine

## 2019-04-14 ENCOUNTER — Other Ambulatory Visit: Payer: Self-pay

## 2019-04-14 ENCOUNTER — Encounter: Payer: Self-pay | Admitting: Family Medicine

## 2019-04-14 VITALS — BP 130/82 | HR 60 | Resp 16 | Ht 63.0 in | Wt 162.0 lb

## 2019-04-14 DIAGNOSIS — I10 Essential (primary) hypertension: Secondary | ICD-10-CM

## 2019-04-14 DIAGNOSIS — Z0001 Encounter for general adult medical examination with abnormal findings: Secondary | ICD-10-CM | POA: Diagnosis not present

## 2019-04-14 DIAGNOSIS — G4733 Obstructive sleep apnea (adult) (pediatric): Secondary | ICD-10-CM | POA: Diagnosis not present

## 2019-04-14 DIAGNOSIS — E119 Type 2 diabetes mellitus without complications: Secondary | ICD-10-CM

## 2019-04-14 DIAGNOSIS — R7989 Other specified abnormal findings of blood chemistry: Secondary | ICD-10-CM | POA: Insufficient documentation

## 2019-04-14 DIAGNOSIS — E785 Hyperlipidemia, unspecified: Secondary | ICD-10-CM

## 2019-04-14 DIAGNOSIS — E118 Type 2 diabetes mellitus with unspecified complications: Secondary | ICD-10-CM | POA: Insufficient documentation

## 2019-04-14 NOTE — Assessment & Plan Note (Signed)
Needs updated equipment and possible updated test that is been over 5 years.  Referral to neurology.

## 2019-04-14 NOTE — Progress Notes (Signed)
Health Maintenance reviewed -   Immunization History  Administered Date(s) Administered  . Influenza-Unspecified 11/09/2018   Last Pap smear: 12/2018 Last mammogram: Ordered Last colonoscopy: 09/2016 Last DEXA: N/A Dentist: Twice yearly cleaning, one implant  Ophtho: Summer 2020 Exercise: Some: yoga and move around in yard  Other doctors caring for patient include:  Patient Care Team: Perlie Mayo, NP as PCP - General (Family Medicine)  End of Life Discussion:  Patient does not have a living will and medical power of attorney    Code Status: Not on file   Subjective:   HPI  Sophia Robbins is a 59 y.o. female who presents for annual wellness visit and follow-up on chronic medical conditions.  She has the following concerns: No concerns today in the office.  She denies having any skin issues or breakdown.  She denies having any changes in bowel or bladder habits.  Denies having any falls or need of using assistive devices.  Does wear glasses.  Denies having any memory changes.  Reports eating a balanced diet but could do better.  Reports drinking water daily.  Reports sleeping well.  Reports having COVID vaccines-Moderna   Review Of Systems  Review of Systems  Constitutional: Negative.   HENT: Negative.   Eyes: Negative.   Respiratory: Negative.   Cardiovascular: Negative.   Gastrointestinal: Negative.   Endocrine: Negative.   Genitourinary: Negative.   Musculoskeletal: Negative.   Skin: Negative.   Allergic/Immunologic: Negative.   Neurological: Negative.   Hematological: Negative.   Psychiatric/Behavioral: Negative.   All other systems reviewed and are negative.   Objective:   PHYSICAL EXAM:  BP 130/82   Pulse 60   Resp 16   Ht 5\' 3"  (1.6 m)   Wt 162 lb (73.5 kg)   SpO2 97%   BMI 28.70 kg/m    Physical Exam Vitals and nursing note reviewed.  Constitutional:      Appearance: Normal appearance. She is normal weight.  HENT:     Head:  Normocephalic and atraumatic.     Right Ear: Tympanic membrane, ear canal and external ear normal.     Left Ear: Tympanic membrane, ear canal and external ear normal.     Nose: Nose normal.     Mouth/Throat:     Mouth: Mucous membranes are moist.     Pharynx: Oropharynx is clear.  Eyes:     General: No scleral icterus.       Right eye: No discharge.        Left eye: No discharge.     Extraocular Movements: Extraocular movements intact.     Conjunctiva/sclera: Conjunctivae normal.     Pupils: Pupils are equal, round, and reactive to light.     Comments: Glasses  Neck:     Thyroid: No thyroid mass, thyromegaly or thyroid tenderness.  Cardiovascular:     Rate and Rhythm: Normal rate and regular rhythm.     Pulses: Normal pulses.          Radial pulses are 2+ on the right side and 2+ on the left side.       Dorsalis pedis pulses are 2+ on the right side and 2+ on the left side.     Heart sounds: Normal heart sounds.  Pulmonary:     Effort: Pulmonary effort is normal.     Breath sounds: Normal breath sounds.  Abdominal:     General: Abdomen is flat. Bowel sounds are normal.  Palpations: Abdomen is soft.     Tenderness: There is no abdominal tenderness. There is no right CVA tenderness or left CVA tenderness.     Hernia: No hernia is present.  Musculoskeletal:        General: Normal range of motion.     Cervical back: Normal range of motion and neck supple.     Right lower leg: No edema.     Left lower leg: No edema.  Lymphadenopathy:     Cervical: No cervical adenopathy.  Skin:    General: Skin is warm and dry.     Capillary Refill: Capillary refill takes less than 2 seconds.  Neurological:     General: No focal deficit present.     Mental Status: She is alert and oriented to person, place, and time. Mental status is at baseline.     Cranial Nerves: Cranial nerves are intact.     Sensory: Sensation is intact.     Motor: Motor function is intact.     Coordination:  Coordination is intact.     Gait: Gait is intact.     Deep Tendon Reflexes: Reflexes are normal and symmetric.  Psychiatric:        Attention and Perception: Attention and perception normal.        Mood and Affect: Mood and affect normal.        Speech: Speech normal.        Behavior: Behavior normal. Behavior is cooperative.        Thought Content: Thought content normal.        Cognition and Memory: Cognition and memory normal.        Judgment: Judgment normal.     Depression Screening  Depression screen [facility] 2/9 01/19/2019 12/31/2018  Decreased Interest 0 0  Down, Depressed, Hopeless 0 0  PHQ - 2 Score 0 0      Assessment & Plan:   1. Annual visit for general adult medical examination with abnormal findings   2. Diabetes mellitus without complication (HCC)   3. OSA (obstructive sleep apnea)   4. Essential hypertension   5. Hyperlipidemia, unspecified hyperlipidemia type   6. Elevated TSH     Tests ordered Orders Placed This Encounter  Procedures  . CBC  . COMPLETE METABOLIC PANEL WITH GFR  . Hemoglobin A1c  . Lipid panel  . TSH  . Ambulatory referral to Neurology     Plan: Please see assessment and plan per problem list above.  The patient's weight, height, BMI, and visual acuity have been recorded in the chart.  I have made referrals, counseling, and provided education to the patient based on review of the above and I have provided the patient with a written personalized care plan for preventive services.     Freddy Finner, NP   04/14/2019

## 2019-04-14 NOTE — Assessment & Plan Note (Signed)
Elevated cholesterol does not tolerate statins is on Zetia needs to do diet and lifestyle changes discussed today in the office.  Encourage low-fat diet along with exercise.

## 2019-04-14 NOTE — Assessment & Plan Note (Signed)
Discussed monthly self breast exams and yearly mammograms; at least 30 minutes of aerobic activity at least 5 days/week and weight-bearing exercise 2x/week; proper sunscreen use reviewed; healthy diet, including goals of calcium and vitamin D intake and alcohol recommendations (less than or equal to 1 drink/day) reviewed; regular seatbelt use; changing batteries in smoke detectors.  Immunization recommendations discussed.  Colonoscopy recommendations reviewed.  

## 2019-04-14 NOTE — Patient Instructions (Addendum)
I appreciate the opportunity to provide you with care for your health and wellness. Today we discussed: overall health   Follow up: 6 months   Labs 1 week before next appt-fasting Referral today for sleep study  Work note today   Please continue to practice social distancing to keep you, your family, and our community safe.  If you must go out, please wear a mask and practice good handwashing.  It was a pleasure to see you and I look forward to continuing to work together on your health and well-being. Please do not hesitate to call the office if you need care or have questions about your care.  Have a wonderful day and week. With Gratitude, Tereasa Coop, DNP, AGNP-BC  HEALTH MAINTENANCE RECOMMENDATIONS:  It is recommended that you get at least 30 minutes of aerobic exercise at least 5 days/week (for weight loss, you may need as much as 60-90 minutes). This can be any activity that gets your heart rate up. This can be divided in 10-15 minute intervals if needed, but try and build up your endurance at least once a week.  Weight bearing exercise is also recommended twice weekly.  Eat a healthy diet with lots of vegetables, fruits and fiber.  "Colorful" foods have a lot of vitamins (ie green vegetables, tomatoes, red peppers, etc).  Limit sweet tea, regular sodas and alcoholic beverages, all of which has a lot of calories and sugar.  Up to 1 alcoholic drink daily may be beneficial for women (unless trying to lose weight, watch sugars).  Drink a lot of water.  Calcium recommendations are 1200-1500 mg daily (1500 mg for postmenopausal women or women without ovaries), and vitamin D 1000 IU daily.  This should be obtained from diet and/or supplements (vitamins), and calcium should not be taken all at once, but in divided doses.  Monthly self breast exams and yearly mammograms for women over the age of 60 is recommended.  Sunscreen of at least SPF 30 should be used on all sun-exposed parts of  the skin when outside between the hours of 10 am and 4 pm (not just when at beach or pool, but even with exercise, golf, tennis, and yard work!)  Use a sunscreen that says "broad spectrum" so it covers both UVA and UVB rays, and make sure to reapply every 1-2 hours.  Remember to change the batteries in your smoke detectors when changing your clock times in the spring and fall.  Use your seat belt every time you are in a car, and please drive safely and not be distracted with cell phones and texting while driving.

## 2019-04-14 NOTE — Assessment & Plan Note (Signed)
Reorder check of TSH will address and treat as appropriate denies having any signs or symptoms of thyroid issues

## 2019-04-14 NOTE — Assessment & Plan Note (Signed)
Is on Cozaar at this time she is controlled.  Encouraged DASH diet in addition to 30 to 60 minutes of daily exercise release 5 days a week.

## 2019-04-14 NOTE — Assessment & Plan Note (Signed)
Controlled doing well on Metformin continue. Additionally takes Ozempic. Is unable to tolerate statin is on Zetia.  Needs to have better diet control.  Educated on diabetic friendly diet and low-fat diet.

## 2019-04-15 LAB — TSH: TSH: 5.9 mIU/L — ABNORMAL HIGH (ref 0.40–4.50)

## 2019-04-19 ENCOUNTER — Other Ambulatory Visit: Payer: Self-pay | Admitting: Family Medicine

## 2019-04-19 ENCOUNTER — Other Ambulatory Visit: Payer: Self-pay | Admitting: *Deleted

## 2019-04-19 DIAGNOSIS — E039 Hypothyroidism, unspecified: Secondary | ICD-10-CM

## 2019-04-19 DIAGNOSIS — E119 Type 2 diabetes mellitus without complications: Secondary | ICD-10-CM

## 2019-04-19 MED ORDER — OZEMPIC (1 MG/DOSE) 2 MG/1.5ML ~~LOC~~ SOPN: 1.0000 mg | PEN_INJECTOR | SUBCUTANEOUS | 3 refills | Status: DC

## 2019-04-19 MED ORDER — LEVOTHYROXINE SODIUM 25 MCG PO TABS: 25.0000 ug | ORAL_TABLET | Freq: Every day | ORAL | 1 refills | Status: DC

## 2019-04-28 ENCOUNTER — Ambulatory Visit (INDEPENDENT_AMBULATORY_CARE_PROVIDER_SITE_OTHER): Payer: Federal, State, Local not specified - PPO | Admitting: Neurology

## 2019-04-28 ENCOUNTER — Other Ambulatory Visit: Payer: Self-pay

## 2019-04-28 ENCOUNTER — Encounter: Payer: Self-pay | Admitting: Neurology

## 2019-04-28 VITALS — BP 166/76 | HR 58 | Temp 97.2°F | Ht 63.0 in | Wt 163.0 lb

## 2019-04-28 DIAGNOSIS — Z820 Family history of epilepsy and other diseases of the nervous system: Secondary | ICD-10-CM

## 2019-04-28 DIAGNOSIS — G4733 Obstructive sleep apnea (adult) (pediatric): Secondary | ICD-10-CM | POA: Diagnosis not present

## 2019-04-28 DIAGNOSIS — E663 Overweight: Secondary | ICD-10-CM | POA: Diagnosis not present

## 2019-04-28 DIAGNOSIS — R634 Abnormal weight loss: Secondary | ICD-10-CM

## 2019-04-28 DIAGNOSIS — R0683 Snoring: Secondary | ICD-10-CM | POA: Diagnosis not present

## 2019-04-28 DIAGNOSIS — Z9989 Dependence on other enabling machines and devices: Secondary | ICD-10-CM

## 2019-04-28 NOTE — Progress Notes (Signed)
Subjective:    Patient ID: Sophia Robbins is a 59 y.o. female.  HPI     Sophia Age, MD, PhD [facility] [address] [place], [place] [address]  Dear Jarrett Soho,   I saw your patient, Sophia Robbins, upon your kind request, sleep clinic today for initial consultation of her sleep disorder, in particular, evaluation of her prior diagnosis of obstructive sleep apnea.  The patient is unaccompanied today.  As you know, Sophia Robbins is a 59 year old right-handed woman with an underlying medical history of diabetes, asthma, allergies, hyperlipidemia, and overweight state, who was previously diagnosed with obstructive sleep apnea and placed on CPAP therapy.  Prior sleep study results are not available for my review today.  Sleep study testing was over 10 years ago, in [place].  I reviewed your office note from 04/14/2019.  Her Epworth sleepiness score is 8 out of 24, fatigue severity score is 19 out of 63.  She reports that she was diagnosed with mild obstructive sleep apnea.  She has been on CPAP and reports full compliance, she brought her machine but a compliance download was not possible as her machine is older.  She was heavier when she was first diagnosed with obstructive sleep apnea and since she was diagnosed with type 2 diabetes she has lost about 30 pounds.  She was also working night shift at the time and had a daytime sleep study.  She has been on day shift for the past several years.  She lives with her husband.  She works in Barr[employer]'s [employer].  Bedtime is around 11 PM and rise time around 730 or 7:45 AM.  She does not endorse any night to night nocturia or recurrent morning headaches.  Her sister has sleep apnea and uses a CPAP machine as well.  They have no indoor pets, 2 outdoor cats, no TV in the bedroom.  She has 1 grown daughter who is a Designer, jewellery.  Daughter lives in [place].  The patient quit smoking some 20 years ago, she drinks alcohol  with dinner, typically 1 glass of wine, caffeine in the form of coffee, 1 cup in the morning and 1 glass of iced tea with lunch.  She is interested in getting new equipment and motivated to continue with treatment.  She benefited from CPAP therapy when she started using it some 10+ years ago.   Her Past Medical History Is Significant For: Past Medical History:  Diagnosis Date  . Allergy   . Asthma   . Diabetes mellitus without complication ([place])   . Hyperlipidemia   . OSA on CPAP     Her Past Surgical History Is Significant For: Past Surgical History:  Procedure Laterality Date  . CESAREAN SECTION    . CHOLECYSTECTOMY    . EXPLORATION POST OPERATIVE OPEN HEART    . KNEE ARTHROSCOPY AND ARTHROTOMY Left   . TUBAL LIGATION      Her Family History Is Significant For: Family History  Problem Relation Robbins of Onset  . Cancer Mother   . Cancer Father   . COPD Brother   . Diabetes Sister     Her Social History Is Significant For: Social History   Socioeconomic History  . Marital status: Married    Spouse name: Not on file  . Number of children: 1  . Years of education: Not on file  . Highest education level: Some college, no degree  Occupational History  . Occupation: Dietitian  Comment: [employer]  Tobacco Use  . Smoking status: Former Smoker    Types: Cigarettes    Quit date: 06/20/2012    Years since quitting: 6.8  . Smokeless tobacco: Never Used  Substance and Sexual Activity  . Alcohol use: Yes    Alcohol/week: 1.0 standard drinks    Types: 1 Glasses of wine per week    Comment: occasional  . Drug use: Not Currently  . Sexual activity: Yes    Birth control/protection: Surgical    Comment: tubal  Other Topics Concern  . Not on file  Social History Narrative   Lives with husband of 37 years    1 daughter -granddaughter and grandson      Two cats-outside      Enjoys: crafts, Web designer, redoing home       Diet: eat all food groups, avoids milk -little  intolerance   Caffeine: 1 cup coffee, unsweet iced tea   Water: 6 cups daily       Wears seat belt   Smoke detectors at home    Does not use phone while driving    Social Determinants of Health   Financial Resource Strain:   . Difficulty of Paying Living Expenses:   Food Insecurity:   . Worried About Programme researcher, broadcasting/film/video in the Last Year:   . Barista in the Last Year:   Transportation Needs:   . Freight forwarder (Medical):   Marland Kitchen Lack of Transportation (Non-Medical):   Physical Activity:   . Days of Exercise per Week:   . Minutes of Exercise per Session:   Stress:   . Feeling of Stress :   Social Connections:   . Frequency of Communication with Friends and Family:   . Frequency of Social Gatherings with Friends and Family:   . Attends Religious Services:   . Active Member of Clubs or Organizations:   . Attends Banker Meetings:   Marland Kitchen Marital Status:     Her Allergies Are:  Allergies  Allergen Reactions  . Codeine Hives  . Shellfish Allergy   . Statins   :   Her Current Medications Are:  Outpatient Encounter Medications as of 04/28/2019  Medication Sig  . aspirin EC 81 MG tablet Take 81 mg by mouth daily.  . cetirizine (ZYRTEC) 10 MG tablet Take 10 mg by mouth daily.  . clobetasol ointment (TEMOVATE) 0.05 % Apply 1 application topically 2 (two) times daily as needed.  . ezetimibe (ZETIA) 10 MG tablet Take 1 tablet (10 mg total) by mouth daily.  . fluticasone (FLOVENT HFA) 110 MCG/ACT inhaler Inhale into the lungs as needed.   Marland Kitchen levothyroxine (SYNTHROID) 25 MCG tablet Take 1 tablet (25 mcg total) by mouth daily before breakfast.  . losartan (COZAAR) 100 MG tablet Take 1 tablet (100 mg total) by mouth daily.  . metFORMIN (GLUCOPHAGE) 500 MG tablet Take 2 tablets (1,000 mg total) by mouth daily with breakfast.  . nystatin cream (MYCOSTATIN) Apply 1 application topically as needed.   . Semaglutide, 1 MG/DOSE, (OZEMPIC, 1 MG/DOSE,) 2 MG/1.5ML SOPN  Inject 1 mg into the skin once a week.  . triamcinolone cream (KENALOG) 0.1 % Apply 1 application topically 2 (two) times daily as needed.   No facility-administered encounter medications on file as of 04/28/2019.  :  Review of Systems:  Out of a complete 14 point review of systems, all are reviewed and negative with the exception of these symptoms as listed below:  Review of Systems  Neurological:       Pt presents today to discuss getting a new cpap. Pt has a F&P that is about 60 years old. Pt's last sleep study was in [facility], results not available. Pt does snore without her cpap. Pt does not have a local DME. Pt reports using her cpap every night but data is not obtainable from this machine.  Epworth Sleepiness Scale 0= would never doze 1= slight chance of dozing 2= moderate chance of dozing 3= high chance of dozing  Sitting and reading: 2 Watching TV: 3 Sitting inactive in a public place (ex. Theater or meeting): 1 As a passenger in a car for an hour without a break: 1 Lying down to rest in the afternoon: 1 Sitting and talking to someone: 0 Sitting quietly after lunch (no alcohol): 0 In a car, while stopped in traffic: 0 Total: 8     Objective:  Neurological Exam  Physical Exam Physical Examination:   Vitals:   04/28/19 0743  BP: (!) 166/76  Pulse: (!) 58  Temp: (!) 97.2 F (36.2 C)    General Examination: The patient is a very pleasant 59 y.o. female in no acute distress. She appears well-developed and well-nourished and well groomed.   HEENT: Normocephalic, atraumatic, pupils are equal, round and reactive to light, extraocular tracking is good without limitation to gaze excursion or nystagmus noted. Hearing is grossly intact. Face is symmetric with normal facial animation. Speech is clear with no dysarthria noted. There is no hypophonia. There is no lip, neck/head, jaw or voice tremor. Neck is supple with full range of passive and active motion. There are no carotid  bruits on auscultation. Oropharynx exam reveals: moderate mouth dryness, adequate dental hygiene and mild airway crowding, due to tonsils of 1+ and slightly prominent uvula. Mallampati is class II. Tongue protrudes centrally and palate elevates symmetrically. Mild overbite.   Chest: Clear to auscultation without wheezing, rhonchi or crackles noted.  Heart: S1+S2+0, regular and normal without murmurs, rubs or gallops noted.   Abdomen: Soft, non-tender and non-distended with normal bowel sounds appreciated on auscultation.  Extremities: There is no pitting edema in the distal lower extremities bilaterally.   Skin: Warm and dry without trophic changes noted.   Musculoskeletal: exam reveals no obvious joint deformities, tenderness or joint swelling or erythema.   Neurologically:  Mental status: The patient is awake, alert and oriented in all 4 spheres. Her immediate and remote memory, attention, language skills and fund of knowledge are appropriate. There is no evidence of aphasia, agnosia, apraxia or anomia. Speech is clear with normal prosody and enunciation. Thought process is linear. Mood is normal and affect is normal.  Cranial nerves II - XII are as described above under HEENT exam.  Motor exam: Normal bulk, strength and tone is noted. There is no tremor, Romberg is negative. Fine motor skills and coordination: grossly intact.  Cerebellar testing: No dysmetria or intention tremor. There is no truncal or gait ataxia.  Sensory exam: intact to light touch in the upper and lower extremities.  Gait, station and balance: She stands easily. No veering to one side is noted. No leaning to one side is noted. Posture is Robbins-appropriate and stance is narrow based. Gait shows normal stride length and normal pace. No problems turning are noted. Tandem walk is challenging for her.                 Assessment and Plan:  In summary, Teal Raben is  a very pleasant 59 y.o.-year old female with an underlying  medical history of diabetes, asthma, allergies, hyperlipidemia, and overweight state, who presents for re-evaluation of her obstructive sleep apnea (OSA). I had a long chat with the patient about my findings and the diagnosis of OSA, its prognosis and treatment options. We talked about medical treatments, surgical interventions and non-pharmacological approaches. I outlined the risks and ramifications of untreated moderate to severe OSA, especially with respect to developing cardiovascular disease down the Road, including congestive heart failure, difficult to treat hypertension, cardiac arrhythmias, or stroke. Even type 2 diabetes has, in part, been linked to untreated OSA. Symptoms of untreated OSA include daytime sleepiness, memory problems, mood irritability and mood disorder such as depression and anxiety, lack of energy, as well as recurrent headaches, especially morning headaches. We talked about trying to maintain a healthy lifestyle in general, as well as the importance of weight control. We also talked about the importance of good sleep hygiene. I recommended the following at this time: sleep study. She has used a CPAP for over 10 years and qualifies for new equipment. She will benefit from re-evaluation with a sleep study. She is motivated to continue using a positive airway pressure (PAP) machine.  I answered all her questions today and the patient was in agreement. I plan to see her back after the sleep study is completed and encouraged her to call with any interim questions, concerns, problems or updates.   Thank you very much for allowing me to participate in the care of this nice patient. If I can be of any further assistance to you please do not hesitate to call me at [phone].  Sincerely,   Huston Foley, MD, PhD

## 2019-04-28 NOTE — Patient Instructions (Signed)

## 2019-05-11 ENCOUNTER — Other Ambulatory Visit: Payer: Self-pay | Admitting: *Deleted

## 2019-05-11 DIAGNOSIS — E039 Hypothyroidism, unspecified: Secondary | ICD-10-CM

## 2019-05-11 MED ORDER — LEVOTHYROXINE SODIUM 25 MCG PO TABS: 25.0000 ug | ORAL_TABLET | Freq: Every day | ORAL | 5 refills | Status: DC

## 2019-05-23 ENCOUNTER — Ambulatory Visit (INDEPENDENT_AMBULATORY_CARE_PROVIDER_SITE_OTHER): Payer: Federal, State, Local not specified - PPO | Admitting: Neurology

## 2019-05-23 ENCOUNTER — Other Ambulatory Visit: Payer: Self-pay

## 2019-05-23 DIAGNOSIS — E663 Overweight: Secondary | ICD-10-CM

## 2019-05-23 DIAGNOSIS — G4733 Obstructive sleep apnea (adult) (pediatric): Secondary | ICD-10-CM

## 2019-05-23 DIAGNOSIS — Z820 Family history of epilepsy and other diseases of the nervous system: Secondary | ICD-10-CM

## 2019-05-23 DIAGNOSIS — Z9989 Dependence on other enabling machines and devices: Secondary | ICD-10-CM

## 2019-05-23 DIAGNOSIS — R634 Abnormal weight loss: Secondary | ICD-10-CM

## 2019-05-23 DIAGNOSIS — R0683 Snoring: Secondary | ICD-10-CM

## 2019-05-24 ENCOUNTER — Other Ambulatory Visit (HOSPITAL_COMMUNITY): Payer: Self-pay | Admitting: Family Medicine

## 2019-05-24 DIAGNOSIS — Z1231 Encounter for screening mammogram for malignant neoplasm of breast: Secondary | ICD-10-CM

## 2019-05-25 ENCOUNTER — Other Ambulatory Visit (HOSPITAL_COMMUNITY)
Admission: RE | Admit: 2019-05-25 | Discharge: 2019-05-25 | Disposition: A | Discharge status: Home / Self Care | Payer: Federal, State, Local not specified - PPO | Attending: *Deleted | Admitting: *Deleted

## 2019-05-25 ENCOUNTER — Ambulatory Visit (HOSPITAL_COMMUNITY)
Admission: RE | Admit: 2019-05-25 | Discharge: 2019-05-25 | Disposition: A | Discharge status: Home / Self Care | Payer: Federal, State, Local not specified - PPO | Source: Ambulatory Visit | Attending: Family Medicine | Admitting: Family Medicine

## 2019-05-25 ENCOUNTER — Other Ambulatory Visit: Payer: Self-pay

## 2019-05-25 ENCOUNTER — Ambulatory Visit: Payer: Federal, State, Local not specified - PPO | Admitting: Family Medicine

## 2019-05-25 ENCOUNTER — Encounter: Payer: Self-pay | Admitting: Family Medicine

## 2019-05-25 VITALS — BP 138/84 | HR 66 | Temp 97.9°F | Resp 15 | Ht 63.0 in | Wt 160.0 lb

## 2019-05-25 DIAGNOSIS — E039 Hypothyroidism, unspecified: Secondary | ICD-10-CM | POA: Insufficient documentation

## 2019-05-25 DIAGNOSIS — E038 Other specified hypothyroidism: Secondary | ICD-10-CM | POA: Insufficient documentation

## 2019-05-25 DIAGNOSIS — N3001 Acute cystitis with hematuria: Secondary | ICD-10-CM

## 2019-05-25 DIAGNOSIS — Z1231 Encounter for screening mammogram for malignant neoplasm of breast: Secondary | ICD-10-CM | POA: Insufficient documentation

## 2019-05-25 LAB — POCT URINALYSIS DIP (CLINITEK)
Bilirubin, UA: NEGATIVE
Glucose, UA: NEGATIVE mg/dL
Ketones, POC UA: NEGATIVE mg/dL
Nitrite, UA: NEGATIVE
POC PROTEIN,UA: 100 — AB
Spec Grav, UA: >=1.03 — AB (ref 1.010–1.025)
Urobilinogen, UA: 0.2 E.U./dL
pH, UA: 5.5 (ref 5.0–8.0)

## 2019-05-25 MED ORDER — NITROFURANTOIN MONOHYD MACRO 100 MG PO CAPS: 100.0000 mg | ORAL_CAPSULE | Freq: Two times a day (BID) | ORAL | 0 refills | Status: AC

## 2019-05-25 NOTE — Assessment & Plan Note (Signed)
Was started on Synthroid 25 mcg.  Reports today that she has had increasing constipation.  We will do a trial of increasing to 50 mcg in addition to increasing water intake, fiber intake and mobility walking for exercise and helping with GI mobility.   Reviewed side effects, risks and benefits of medication.   Patient acknowledged agreement and understanding of the plan.

## 2019-05-25 NOTE — Assessment & Plan Note (Signed)
Urine dip today positive for white blood cells and blood will be sending out for culture and sensitivity.  Started on Macrobid.  Encouraged to increase water, avoid baths, changes in any hygiene products, voiding after intercourse and good hygiene.

## 2019-05-25 NOTE — Progress Notes (Signed)
Subjective:  Patient ID: Sophia Robbins, female    DOB: 25-Apr-1960  Age: 59 y.o. MRN: [id number]  CC:  Chief Complaint  Patient presents with  . Hypothyroidism    follow up visit to assess thyroid med. Is making her constipated and tried getting some probiotics.   . Urinary Frequency    has noticed an increase ini urination that started yesterday but no dysuria      Sophia Robbins is a 60 year old female patient of mine.  Who presents today for follow-up on hypothyroidism in addition to having increased urinary frequency.  HPI  Thyroid Problem Presents for follow-up visit. Symptoms include constipation. The symptoms have been worsening (Recent start of Synthroid, 25 mcg, has not been quite 12 weeks yet for recheck.  Wondered if it was related to the medication versus just the thyroid.).  Urinary Tract Infection  This is a new problem. The current episode started yesterday. The problem occurs every urination. The problem has been unchanged. The pain is at a severity of 0/10. The patient is experiencing no pain. There has been no fever. She is sexually active. There is no history of pyelonephritis. Associated symptoms include urgency. She has tried nothing for the symptoms. The treatment provided no relief.   Today patient denies signs and symptoms of COVID 19 infection including fever, chills, cough, shortness of breath, and headache. Past Medical, Surgical, Social History, Allergies, and Medications have been Reviewed.   Past Medical History:  Diagnosis Date  . Allergy   . Asthma   . Diabetes mellitus without complication ([place])   . Hyperlipidemia   . OSA on CPAP     Current Meds  Medication Sig  . aspirin EC 81 MG tablet Take 81 mg by mouth daily.  . cetirizine (ZYRTEC) 10 MG tablet Take 10 mg by mouth daily.  . clobetasol ointment (TEMOVATE) 9.21 % Apply 1 application topically 2 (two) times daily as needed.  . ezetimibe (ZETIA) 10 MG tablet Take 1 tablet (10 mg total) by  mouth daily.  . fluticasone (FLOVENT HFA) 110 MCG/ACT inhaler Inhale into the lungs as needed.   Marland Kitchen levothyroxine (SYNTHROID) 25 MCG tablet Take 1 tablet (25 mcg total) by mouth daily before breakfast.  . losartan (COZAAR) 100 MG tablet Take 1 tablet (100 mg total) by mouth daily.  . metFORMIN (GLUCOPHAGE) 500 MG tablet Take 2 tablets (1,000 mg total) by mouth daily with breakfast.  . nystatin cream (MYCOSTATIN) Apply 1 application topically as needed.   . Semaglutide, 1 MG/DOSE, (OZEMPIC, 1 MG/DOSE,) 2 MG/1.5ML SOPN Inject 1 mg into the skin once a week.  . triamcinolone cream (KENALOG) 0.1 % Apply 1 application topically 2 (two) times daily as needed.    ROS:  Review of Systems  Constitutional: Negative.   HENT: Negative.   Eyes: Negative.   Respiratory: Negative.   Cardiovascular: Negative.   Gastrointestinal: Positive for constipation.  Genitourinary: Positive for urgency.  Musculoskeletal: Negative.   Skin: Negative.   Neurological: Negative.   Endo/Heme/Allergies: Negative.   Psychiatric/Behavioral: Negative.   All other systems reviewed and are negative.    Objective:   Today's Vitals: BP 138/84   Pulse 66   Temp 97.9 F (36.6 C) (Temporal)   Resp 15   Ht 5\' 3"  (1.6 m)   Wt 160 lb (72.6 kg)   SpO2 97%   BMI 28.34 kg/m  Vitals with BMI 05/25/2019 04/28/2019 04/14/2019  Height 5\' 3"  5\' 3"  5\' 3"   Weight 160 lbs  163 lbs 162 lbs  BMI 28.35 28.88 28.7  Systolic 138 166 893  Diastolic 84 76 82  Pulse 66 58 60     Physical Exam Vitals and nursing note reviewed.  Constitutional:      Appearance: Normal appearance. She is well-developed, well-groomed and overweight.  HENT:     Head: Normocephalic and atraumatic.     Right Ear: External ear normal.     Left Ear: External ear normal.     Mouth/Throat:     Comments: Mask in place  Eyes:     General:        Right eye: No discharge.        Left eye: No discharge.     Conjunctiva/sclera: Conjunctivae normal.    Cardiovascular:     Rate and Rhythm: Normal rate and regular rhythm.     Pulses: Normal pulses.     Heart sounds: Normal heart sounds.  Pulmonary:     Effort: Pulmonary effort is normal.     Breath sounds: Normal breath sounds.  Musculoskeletal:        General: Normal range of motion.     Cervical back: Normal range of motion and neck supple.  Skin:    General: Skin is warm.  Neurological:     General: No focal deficit present.     Mental Status: She is alert and oriented to person, place, and time.  Psychiatric:        Attention and Perception: Attention normal.        Mood and Affect: Mood normal.        Speech: Speech normal.        Behavior: Behavior normal. Behavior is cooperative.        Thought Content: Thought content normal.        Cognition and Memory: Cognition normal.        Judgment: Judgment normal.      Assessment   1. Hypothyroidism, unspecified type   2. Acute cystitis with hematuria     Tests ordered Orders Placed This Encounter  Procedures  . Urine Culture  . POCT URINALYSIS DIP (CLINITEK)     Plan: Please see assessment and plan per problem list above.   Meds ordered this encounter  Medications  . nitrofurantoin, macrocrystal-monohydrate, (MACROBID) 100 MG capsule    Sig: Take 1 capsule (100 mg total) by mouth 2 (two) times daily for 5 days.    Dispense:  10 capsule    Refill:  0    Order Specific Question:   Supervising Provider    Answer:   Genia Harold    Patient to follow-up in 10/18/2019 as scheduled   Freddy Finner, NP

## 2019-05-25 NOTE — Patient Instructions (Addendum)
I appreciate the opportunity to provide you with care for your health and wellness. Today we discussed: increased urination and constipation   Follow up: as scheduled  No labs or referrals today  Start medication and take in full. If need a different one based on culture we will let you know.  You can increase your synthroid to 50 mcg daily to see if that helps with constipation. Increasing water and fiber in diet can also help, as well as walking 30 mins daily.  Please continue to practice social distancing to keep you, your family, and our community safe.  If you must go out, please wear a mask and practice good handwashing.  It was a pleasure to see you and I look forward to continuing to work together on your health and well-being. Please do not hesitate to call the office if you need care or have questions about your care.  Have a wonderful day and week. With Gratitude, Tereasa Coop, DNP, AGNP-BC

## 2019-05-26 LAB — URINE CULTURE: Culture: <10000 — AB

## 2019-05-31 ENCOUNTER — Telehealth: Payer: Self-pay

## 2019-05-31 NOTE — Addendum Note (Signed)
Addended by: Huston Foley on: 05/31/2019 07:22 AM   Modules accepted: Orders

## 2019-05-31 NOTE — Telephone Encounter (Signed)
I called pt. I advised pt that Dr. Frances Furbish reviewed their sleep study results and found that pt recent sleep study did confirm a dx of osa. Dr. Frances Furbish recommends that pt start on an autopap for treatement. I reviewed PAP compliance expectations with the pt. Pt is agreeable to starting an auto-PAP. I advised pt that an order will be sent to a DME, [facility], and they will call the pt within about one week after they file with the pt's insurance. [facility]  will show the pt how to use the machine, fit for masks, and troubleshoot the auto-PAP if needed. A follow up appt was made for insurance purposes with Butch Penny  on 08/11/2019 at 800 am. Pt verbalized understanding to arrive 30 minutes early and bring their auto-PAP. A letter with all of this information in it will be mailed to the pt as a reminder. I verified with the pt that the address we have on file is correct. Pt verbalized understanding of results. Pt had no questions at this time but was encouraged to call back if questions arise. I have sent the order to [facility] and have received confirmation that they have received the order.

## 2019-05-31 NOTE — Telephone Encounter (Signed)
Erica @[facility] is asking for a call from [facility] re: the order for pt's CPAP.  [facility] is asking for a call to discuss pt's sleep study.  Please call Alcario Drought @ [phone]

## 2019-05-31 NOTE — Procedures (Signed)
Patient Information     First Name: Sophia Last Name: Robbins ID: [id number]  Birth Date: 1960-07-22 Age: 59 Gender: Female  Referring Provider: Perlie Mayo, NP BMI: 28.9 (W=163 lb, H=5' 3'')    Epworth:  8/24   Sleep Study Information    Study Date: May 23, 2019 S/H/A Version: [id number] / 4.0.1515 / 76  History:    59 year old woman with a history of diabetes, asthma, allergies, hyperlipidemia, and overweight state, who was previously diagnosed with obstructive sleep apnea and placed on CPAP therapy. Sleep study testing was over 10 years ago, in [place]. Summary & Diagnosis:     OSA Recommendations:     This home sleep test demonstrates overall mild obstructive sleep apnea with a total AHI of 12.2/hour and O2 nadir of 84%. Given the patient's medical history and benefit from PAP therapy for years, she will be advised to start autoPAP treatment at home with new equipment. A full night CPAP titration study will help with proper treatment settings and mask fitting, if needed down the road. Alternative treatments may include weight loss along with avoidance of the supine sleep position, or an oral appliance in appropriate candidates.   Please note that untreated obstructive sleep apnea may carry additional perioperative morbidity. Patients with significant obstructive sleep apnea should receive perioperative PAP therapy and the surgeons and particularly the anesthesiologist should be informed of the diagnosis and the severity of the sleep disordered breathing. The patient should be cautioned not to drive, work at heights, or operate dangerous or heavy equipment when tired or sleepy. Review and reiteration of good sleep hygiene measures should be pursued with any patient. Other causes of the patient's symptoms, including circadian rhythm disturbances, an underlying mood disorder, medication effect and/or an underlying medical problem cannot be ruled out based on this test. Clinical correlation is  recommended.   The patient and his referring provider will be notified of the test results. The patient will be seen in follow up in sleep clinic at [facility].  I certify that I have reviewed the raw data recording prior to the issuance of this report in accordance with the standards of the American Academy of Sleep Medicine (AASM).  Star Age, MD, PhD [facility]) Diplomat, ABPN (Neurology and Sleep)            Sleep Summary  Oxygen Saturation Statistics   Start Study Time: End Study Time: Total Recording Time:          11:01:36 PM 7:12:22 AM   8 h, 10 min  Total Sleep Time % REM of Sleep Time:  7 h, 26 min  24.4    Mean: 92 Minimum: 84 Maximum: 97  Mean of Desaturations Nadirs (%):   88  Oxygen Desaturation. %: 4-9 10-20 >20 Total  Events Number Total  36 100.0  0 0.0  0 0.0  36 100.0  Oxygen Saturation: <90 <=88 <85 <80 <70  Duration (minutes): Sleep % 29.5 13.9 6.6 3.1 0.0 0.0 0.0 0.0 0.0 0.0     Respiratory Indices      Total Events REM NREM All Night  pRDI:  105  pAHI:  91 ODI:  36  pAHIc:  3  % CSR: 0.0 19.3 16.5 7.7 0.6 12.4 10.8 3.9 0.4 14.1 12.2 4.8 0.4       Pulse Rate Statistics during Sleep (BPM)      Mean: 66 Minimum: 52 Maximum: 100    Indices are calculated using  technically valid sleep time of 7 h, 26 min. Central-Indices are calculated using technically valid sleep time of 7 h, 19 min. pRDI/pAHI are calculated using oxi desaturations ? 3%  Body Position Statistics  Position Supine Prone Right Left Non-Supine  Sleep (min) 315.3 60.5 0.0 71.0 131.5  Sleep % 70.6 13.5 0.0 15.9 29.4  pRDI 17.2 3.0 N/A 10.2 6.9  pAHI 15.1 2.0 N/A 8.5 5.5  ODI 6.3 0.0 N/A 2.5 1.4     Snoring Statistics Snoring Level (dB) >40 >50 >60 >70 >80 >Threshold (45)  Sleep (min) 156.9 3.5 0.8 0.1 0.0 14.2  Sleep % 35.1 0.8 0.2 0.0 0.0 3.2    Mean: 41 dB Sleep Stages Chart                   pAHI=12.2                                Mild              Moderate                    Severe                                                 5              15                    30

## 2019-05-31 NOTE — Telephone Encounter (Signed)
-----   Message from Huston Foley, MD sent at 05/31/2019  7:22 AM EDT ----- Patient referred by Tereasa Coop, NP, seen by me on 04/28/19, HST on 05/23/19  Please call and notify the patient that the recent home sleep test confirmed her Dx of OSA. She should be eligible for new equipment and I suggest we start her on autoPAP, which means, that we don't have to bring her in for a sleep study with CPAP, but will let her start using an autoPAP machine at home, through a DME company (of her choice, or as per insurance requirement). The DME representative will educate her on how to use the machine, how to put the mask on, etc. I have placed an order in the chart. Please send referral, talk to patient, send report to referring MD. We will need a FU in sleep clinic for 10 weeks post-PAP set up, please arrange that with me or one of our NPs. Thanks,   Huston Foley, MD, PhD [facility])

## 2019-05-31 NOTE — Progress Notes (Signed)
Patient referred by Tereasa Coop, NP, seen by me on 04/28/19, HST on 05/23/19  Please call and notify the patient that the recent home sleep test confirmed her Dx of OSA. She should be eligible for new equipment and I suggest we start her on autoPAP, which means, that we don't have to bring her in for a sleep study with CPAP, but will let her start using an autoPAP machine at home, through a DME company (of her choice, or as per insurance requirement). The DME representative will educate her on how to use the machine, how to put the mask on, etc. I have placed an order in the chart. Please send referral, talk to patient, send report to referring MD. We will need a FU in sleep clinic for 10 weeks post-PAP set up, please arrange that with me or one of our NPs. Thanks,   Huston Foley, MD, PhD [facility])

## 2019-06-01 NOTE — Telephone Encounter (Signed)
Alcario Drought called me back. She reports since the pt's AHI was 12 further documentation stating the pt has a comorbid like HTN, stroke or mood disorder/insmonia would be needed to get her autopap approved. Pt does have d/x of essential hypertension and is on losartan. I have faxed the information regarding this to [facility].

## 2019-06-01 NOTE — Telephone Encounter (Signed)
LVM for Erica to call back.

## 2019-06-06 ENCOUNTER — Inpatient Hospital Stay
Admission: RE | Admit: 2019-06-06 | Discharge: 2019-06-06 | Disposition: A | Discharge status: Home / Self Care | Payer: Self-pay | Source: Ambulatory Visit | Attending: Family Medicine | Admitting: Family Medicine

## 2019-06-06 ENCOUNTER — Other Ambulatory Visit (HOSPITAL_COMMUNITY): Payer: Self-pay | Admitting: Family Medicine

## 2019-06-06 DIAGNOSIS — Z1231 Encounter for screening mammogram for malignant neoplasm of breast: Secondary | ICD-10-CM

## 2019-06-10 DIAGNOSIS — G4733 Obstructive sleep apnea (adult) (pediatric): Secondary | ICD-10-CM | POA: Diagnosis not present

## 2019-06-10 DIAGNOSIS — I10 Essential (primary) hypertension: Secondary | ICD-10-CM | POA: Diagnosis not present

## 2019-07-08 ENCOUNTER — Other Ambulatory Visit: Payer: Self-pay

## 2019-07-08 ENCOUNTER — Telehealth: Payer: Self-pay

## 2019-07-08 DIAGNOSIS — E039 Hypothyroidism, unspecified: Secondary | ICD-10-CM

## 2019-07-08 MED ORDER — LEVOTHYROXINE SODIUM 25 MCG PO TABS: 50.0000 ug | ORAL_TABLET | Freq: Every day | ORAL | 0 refills | Status: DC

## 2019-07-08 NOTE — Telephone Encounter (Signed)
Please levothyroxine in for 50mg  --pt states that told her to increase, so she is running out of the medication sooner.

## 2019-07-08 NOTE — Telephone Encounter (Signed)
Medication sent in. 

## 2019-07-11 DIAGNOSIS — I10 Essential (primary) hypertension: Secondary | ICD-10-CM | POA: Diagnosis not present

## 2019-07-11 DIAGNOSIS — G4733 Obstructive sleep apnea (adult) (pediatric): Secondary | ICD-10-CM | POA: Diagnosis not present

## 2019-07-27 ENCOUNTER — Other Ambulatory Visit: Payer: Self-pay | Admitting: *Deleted

## 2019-07-27 DIAGNOSIS — E119 Type 2 diabetes mellitus without complications: Secondary | ICD-10-CM

## 2019-07-27 MED ORDER — METFORMIN HCL 500 MG PO TABS: 1000.0000 mg | ORAL_TABLET | Freq: Every day | ORAL | 0 refills | Status: DC

## 2019-08-09 ENCOUNTER — Encounter: Payer: Self-pay | Admitting: Adult Health

## 2019-08-10 ENCOUNTER — Other Ambulatory Visit: Payer: Self-pay | Admitting: *Deleted

## 2019-08-10 DIAGNOSIS — E039 Hypothyroidism, unspecified: Secondary | ICD-10-CM

## 2019-08-10 DIAGNOSIS — G4733 Obstructive sleep apnea (adult) (pediatric): Secondary | ICD-10-CM | POA: Diagnosis not present

## 2019-08-10 DIAGNOSIS — I10 Essential (primary) hypertension: Secondary | ICD-10-CM | POA: Diagnosis not present

## 2019-08-10 MED ORDER — LEVOTHYROXINE SODIUM 25 MCG PO TABS: 50.0000 ug | ORAL_TABLET | Freq: Every day | ORAL | 0 refills | Status: DC

## 2019-08-11 ENCOUNTER — Encounter: Payer: Self-pay | Admitting: Adult Health

## 2019-08-11 ENCOUNTER — Ambulatory Visit: Payer: Federal, State, Local not specified - PPO | Admitting: Adult Health

## 2019-08-11 ENCOUNTER — Other Ambulatory Visit: Payer: Self-pay

## 2019-08-11 VITALS — BP 128/80 | HR 55 | Ht 63.0 in | Wt 160.6 lb

## 2019-08-11 DIAGNOSIS — Z9989 Dependence on other enabling machines and devices: Secondary | ICD-10-CM

## 2019-08-11 DIAGNOSIS — G4733 Obstructive sleep apnea (adult) (pediatric): Secondary | ICD-10-CM | POA: Diagnosis not present

## 2019-08-11 NOTE — Progress Notes (Addendum)
PATIENT: Sophia Robbins DOB: 12-Jul-1960  REASON FOR VISIT: follow up HISTORY FROM: patient  HISTORY OF PRESENT ILLNESS: Today 08/11/19:  Sophia Robbins is a 59 year old female with a history of obstructive sleep apnea on CPAP.  She returns today for follow-up.  She received a new machine.  Her download indicates that she use her machine 29 out of 30 days for compliance of 97%.  She use her machine greater than 4 hours each night.  On average she uses her machine 8 hours and 8 minutes.  Her residual AHI is 1.9 on 6 to 12 cm of water with EPR of 3.  Leak in the 95th percentile is 22.2 L/min.  HISTORY (copied from Dr. Teofilo Pod note) Sophia Robbins is a 59 year old right-handed woman with an underlying medical history of diabetes, asthma, allergies, hyperlipidemia, and overweight state, who was previously diagnosed with obstructive sleep apnea and placed on CPAP therapy.  Prior sleep study results are not available for my review today.  Sleep study testing was over 10 years ago, in [place].  I reviewed your office note from 04/14/2019.  Her Epworth sleepiness score is 8 out of 24, fatigue severity score is 19 out of 63.  She reports that she was diagnosed with mild obstructive sleep apnea.  She has been on CPAP and reports full compliance, she brought her machine but a compliance download was not possible as her machine is older.  She was heavier when she was first diagnosed with obstructive sleep apnea and since she was diagnosed with type 2 diabetes she has lost about 30 pounds.  She was also working night shift at the time and had a daytime sleep study.  She has been on day shift for the past several years.  She lives with her husband.  She works in Research scientist (medical).  Bedtime is around 11 PM and rise time around 730 or 7:45 AM.  She does not endorse any night to night nocturia or recurrent morning headaches.  Her sister has sleep apnea and uses a CPAP machine as well.  They have no indoor pets, 2 outdoor cats,  no TV in the bedroom.  She has 1 grown daughter who is a Publishing rights manager.  Daughter lives in [place].  The patient quit smoking some 20 years ago, she drinks alcohol with dinner, typically 1 glass of wine, caffeine in the form of coffee, 1 cup in the morning and 1 glass of iced tea with lunch.  She is interested in getting new equipment and motivated to continue with treatment.  She benefited from CPAP therapy when she started using it some 10+ years ago.   REVIEW OF SYSTEMS: Out of a complete 14 system review of symptoms, the patient complains only of the following symptoms, and all other reviewed systems are negative.   ESS 3  ALLERGIES: Allergies  Allergen Reactions  . Codeine Hives  . Shellfish Allergy   . Statins     HOME MEDICATIONS: Outpatient Medications Prior to Visit  Medication Sig Dispense Refill  . aspirin EC 81 MG tablet Take 81 mg by mouth daily.    . cetirizine (ZYRTEC) 10 MG tablet Take 10 mg by mouth daily.    . clobetasol ointment (TEMOVATE) 0.05 % Apply 1 application topically 2 (two) times daily as needed.    . ezetimibe (ZETIA) 10 MG tablet Take 1 tablet (10 mg total) by mouth daily. 90 tablet 2  . fluticasone (FLOVENT HFA) 110 MCG/ACT inhaler Inhale into the lungs  as needed.     Marland Kitchen levothyroxine (SYNTHROID) 25 MCG tablet Take 2 tablets (50 mcg total) by mouth daily before breakfast. 60 tablet 0  . losartan (COZAAR) 100 MG tablet Take 1 tablet (100 mg total) by mouth daily. 90 tablet 2  . metFORMIN (GLUCOPHAGE) 500 MG tablet Take 2 tablets (1,000 mg total) by mouth daily with breakfast. 180 tablet 0  . nystatin cream (MYCOSTATIN) Apply 1 application topically as needed.     . Semaglutide, 1 MG/DOSE, (OZEMPIC, 1 MG/DOSE,) 2 MG/1.5ML SOPN Inject 1 mg into the skin once a week. 1.5 mL 3  . triamcinolone cream (KENALOG) 0.1 % Apply 1 application topically 2 (two) times daily as needed.     No facility-administered medications prior to visit.    PAST MEDICAL  HISTORY: Past Medical History:  Diagnosis Date  . Allergy   . Asthma   . Diabetes mellitus without complication (HCC)   . Hyperlipidemia   . OSA on CPAP     PAST SURGICAL HISTORY: Past Surgical History:  Procedure Laterality Date  . CESAREAN SECTION    . CHOLECYSTECTOMY    . EXPLORATION POST OPERATIVE OPEN HEART    . KNEE ARTHROSCOPY AND ARTHROTOMY Left   . TUBAL LIGATION      FAMILY HISTORY: Family History  Problem Relation Age of Onset  . Cancer Mother   . Cancer Father   . COPD Brother   . Diabetes Sister     SOCIAL HISTORY: Social History   Socioeconomic History  . Marital status: Married    Spouse name: Not on file  . Number of children: 1  . Years of education: Not on file  . Highest education level: Some college, no degree  Occupational History  . Occupation: Public relations account executive    Comment: [employer]  Tobacco Use  . Smoking status: Former Smoker    Types: Cigarettes    Quit date: 06/20/2012    Years since quitting: 7.1  . Smokeless tobacco: Never Used  Vaping Use  . Vaping Use: Never used  Substance and Sexual Activity  . Alcohol use: Yes    Alcohol/week: 1.0 standard drink    Types: 1 Glasses of wine per week    Comment: occasional  . Drug use: Not Currently  . Sexual activity: Yes    Birth control/protection: Surgical    Comment: tubal  Other Topics Concern  . Not on file  Social History Narrative   Lives with husband of 37 years    1 daughter -granddaughter and grandson      Two cats-outside      Enjoys: crafts, Web designer, redoing home       Diet: eat all food groups, avoids milk -little intolerance   Caffeine: 1 cup coffee, unsweet iced tea   Water: 6 cups daily       Wears seat belt   Smoke detectors at home    Does not use phone while driving    Social Determinants of Health   Financial Resource Strain:   . Difficulty of Paying Living Expenses:   Food Insecurity:   . Worried About Programme researcher, broadcasting/film/video in the Last Year:   . Garment/textile technologist in the Last Year:   Transportation Needs:   . Freight forwarder (Medical):   Marland Kitchen Lack of Transportation (Non-Medical):   Physical Activity:   . Days of Exercise per Week:   . Minutes of Exercise per Session:   Stress:   . Feeling  of Stress :   Social Connections:   . Frequency of Communication with Friends and Family:   . Frequency of Social Gatherings with Friends and Family:   . Attends Religious Services:   . Active Member of Clubs or Organizations:   . Attends Banker Meetings:   Marland Kitchen Marital Status:   Intimate Partner Violence:   . Fear of Current or Ex-Partner:   . Emotionally Abused:   Marland Kitchen Physically Abused:   . Sexually Abused:       PHYSICAL EXAM  Vitals:   08/11/19 0744  BP: 128/80  Pulse: (!) 55  Weight: 160 lb 9.6 oz (72.8 kg)  Height: 5\' 3"  (1.6 m)   Body mass index is 28.45 kg/m.  Generalized: Well developed, in no acute distress  Chest: Lungs clear to auscultation bilaterally  Neurological examination  Mentation: Alert oriented to time, place, history taking. Follows all commands speech and language fluent Cranial nerve II-XII: Extraocular movements were full, visual field were full on confrontational test Head turning and shoulder shrug  were normal and symmetric. Motor: The motor testing reveals 5 over 5 strength of all 4 extremities. Good symmetric motor tone is noted throughout.  Sensory: Sensory testing is intact to soft touch on all 4 extremities. No evidence of extinction is noted.  Gait and station: Gait is normal.    DIAGNOSTIC DATA (LABS, IMAGING, TESTING) - I reviewed patient records, labs, notes, testing and imaging myself where available.  Lab Results  Component Value Date   WBC 4.3 04/07/2019   HGB 14.3 04/07/2019   HCT 42.2 04/07/2019   MCV 91.1 04/07/2019   PLT 230 04/07/2019      Component Value Date/Time   NA 141 04/07/2019 0815   K 4.5 04/07/2019 0815   CL 103 04/07/2019 0815   CO2 30 04/07/2019 0815     GLUCOSE 102 (H) 04/07/2019 0815   BUN 12 04/07/2019 0815   CREATININE 0.90 04/07/2019 0815   CALCIUM 10.1 04/07/2019 0815   PROT 6.8 04/07/2019 0815   AST 20 04/07/2019 0815   ALT 18 04/07/2019 0815   BILITOT 0.5 04/07/2019 0815   GFRNONAA 69 04/07/2019 0815   GFRAA 81 04/07/2019 0815   Lab Results  Component Value Date   CHOL 232 (H) 04/07/2019   HDL 64 04/07/2019   LDLCALC 147 (H) 04/07/2019   TRIG 97 04/07/2019   CHOLHDL 3.6 04/07/2019   Lab Results  Component Value Date   HGBA1C 5.3 04/07/2019   No results found for: 04/09/2019 Lab Results  Component Value Date   TSH 5.90 (H) 04/14/2019      ASSESSMENT AND PLAN 59 y.o. year old female  has a past medical history of Allergy, Asthma, Diabetes mellitus without complication (HCC), Hyperlipidemia, and OSA on CPAP. here with:  1. OSA on CPAP  - CPAP compliance excellent - Good treatment of AHI  - Encourage patient to use CPAP nightly and > 4 hours each night - F/U in 1 year or sooner if needed   I spent 20 minutes of face-to-face and non-face-to-face time with patient.  This included previsit chart review, lab review, study review, order entry, electronic health record documentation, patient education.  66, MSN, NP-C 08/11/2019, 8:35 AM [facility] [address] [place], [place] [phone]  I reviewed the above note and documentation by the Nurse Practitioner and agree with the history, exam, assessment and plan as outlined above. I was available for consultation. Saima  Rexene Alberts, MD, PhD [facility])

## 2019-08-16 ENCOUNTER — Other Ambulatory Visit: Payer: Self-pay

## 2019-08-16 DIAGNOSIS — E119 Type 2 diabetes mellitus without complications: Secondary | ICD-10-CM

## 2019-08-16 MED ORDER — OZEMPIC (1 MG/DOSE) 2 MG/1.5ML ~~LOC~~ SOPN: 1.0000 mg | PEN_INJECTOR | SUBCUTANEOUS | 3 refills | Status: DC

## 2019-09-10 DIAGNOSIS — G4733 Obstructive sleep apnea (adult) (pediatric): Secondary | ICD-10-CM | POA: Diagnosis not present

## 2019-09-10 DIAGNOSIS — I10 Essential (primary) hypertension: Secondary | ICD-10-CM | POA: Diagnosis not present

## 2019-09-20 ENCOUNTER — Other Ambulatory Visit: Payer: Self-pay

## 2019-09-20 DIAGNOSIS — E039 Hypothyroidism, unspecified: Secondary | ICD-10-CM

## 2019-09-20 MED ORDER — LEVOTHYROXINE SODIUM 25 MCG PO TABS: 50.0000 ug | ORAL_TABLET | Freq: Every day | ORAL | 2 refills | Status: DC

## 2019-09-29 ENCOUNTER — Other Ambulatory Visit: Payer: Self-pay | Admitting: *Deleted

## 2019-09-29 DIAGNOSIS — I10 Essential (primary) hypertension: Secondary | ICD-10-CM

## 2019-09-29 DIAGNOSIS — E119 Type 2 diabetes mellitus without complications: Secondary | ICD-10-CM

## 2019-09-29 DIAGNOSIS — E785 Hyperlipidemia, unspecified: Secondary | ICD-10-CM

## 2019-09-29 MED ORDER — LOSARTAN POTASSIUM 100 MG PO TABS: 100.0000 mg | ORAL_TABLET | Freq: Every day | ORAL | 2 refills | Status: DC

## 2019-09-29 MED ORDER — EZETIMIBE 10 MG PO TABS: 10.0000 mg | ORAL_TABLET | Freq: Every day | ORAL | 2 refills | Status: DC

## 2019-10-11 DIAGNOSIS — G4733 Obstructive sleep apnea (adult) (pediatric): Secondary | ICD-10-CM | POA: Diagnosis not present

## 2019-10-11 DIAGNOSIS — I10 Essential (primary) hypertension: Secondary | ICD-10-CM | POA: Diagnosis not present

## 2019-10-13 ENCOUNTER — Encounter: Payer: Self-pay | Admitting: Family Medicine

## 2019-10-18 ENCOUNTER — Ambulatory Visit: Payer: Federal, State, Local not specified - PPO | Admitting: Family Medicine

## 2019-10-27 ENCOUNTER — Ambulatory Visit (INDEPENDENT_AMBULATORY_CARE_PROVIDER_SITE_OTHER): Payer: Federal, State, Local not specified - PPO | Admitting: Family Medicine

## 2019-10-27 ENCOUNTER — Other Ambulatory Visit: Payer: Self-pay

## 2019-10-27 ENCOUNTER — Encounter: Payer: Self-pay | Admitting: Family Medicine

## 2019-10-27 VITALS — BP 118/78 | HR 62 | Temp 97.5°F | Resp 16 | Ht 63.0 in | Wt 158.0 lb

## 2019-10-27 DIAGNOSIS — Z0100 Encounter for examination of eyes and vision without abnormal findings: Secondary | ICD-10-CM | POA: Diagnosis not present

## 2019-10-27 DIAGNOSIS — E119 Type 2 diabetes mellitus without complications: Secondary | ICD-10-CM

## 2019-10-27 DIAGNOSIS — E559 Vitamin D deficiency, unspecified: Secondary | ICD-10-CM

## 2019-10-27 DIAGNOSIS — E039 Hypothyroidism, unspecified: Secondary | ICD-10-CM | POA: Diagnosis not present

## 2019-10-27 DIAGNOSIS — E785 Hyperlipidemia, unspecified: Secondary | ICD-10-CM | POA: Diagnosis not present

## 2019-10-27 MED ORDER — OZEMPIC (1 MG/DOSE) 2 MG/1.5ML ~~LOC~~ SOPN: 1.0000 mg | PEN_INJECTOR | SUBCUTANEOUS | 1 refills | Status: DC

## 2019-10-27 NOTE — Patient Instructions (Addendum)
  HAPPY FALL!  I appreciate the opportunity to provide you with care for your health and wellness. Today we discussed: overall health   Follow up: March-April for CPE, no pap  Labs- fasting this morning No referrals today  Happy Wedding Anniversary 38 years!!!   Please continue to practice social distancing to keep you, your family, and our community safe.  If you must go out, please wear a mask and practice good handwashing.  It was a pleasure to see you and I look forward to continuing to work together on your health and well-being. Please do not hesitate to call the office if you need care or have questions about your care.  Have a wonderful day and week. With Gratitude, Tereasa Coop, DNP, AGNP-BC

## 2019-10-27 NOTE — Assessment & Plan Note (Signed)
Vitamin D deficiency found at annual visit.  Has been taking vitamin D as directed.  Will be checking updated labs.

## 2019-10-27 NOTE — Assessment & Plan Note (Signed)
No signs or symptoms of hypothyroidism today.  She is on 50 mcg of Synthroid.  Will be checking her TSH today.

## 2019-10-27 NOTE — Progress Notes (Signed)
Subjective:  Patient ID: Sophia Robbins, female    DOB: 1960-10-15  Age: 59 y.o. MRN: [id number]  CC:  Chief Complaint  Patient presents with  . Follow-up    6 month follow up everything is going well today pt is fasting       HPI  HPI Sophia Robbins is a 59 year old female patient of mine.  She presents today for 84-month follow-up.  She reports everything overall is doing very well for her.  She denies having any sleep changes, no trouble chewing or swallowing or appetite changes.  She denies having any changes in bowel or bladder habits.  No blood in urine or stool.  Denies having any memory changes.  Denies having any falls or injuries.  Denies having any skin issues, hearing changes or vision changes.  She would like to get a 41-month refill on her Ozempic instead of every 30 days.  She recently got her flu shot at her office.  We will give booster Covid when she can.  Is fasting this morning to have her cholesterol checked.  She denies having any chest pain, headache, dizziness, vision changes, cough, new shortness of breath, leg swelling or any other signs or symptoms of possible infection or exposure.  Today patient denies signs and symptoms of COVID 19 infection including fever, chills, cough, shortness of breath, and headache. Past Medical, Surgical, Social History, Allergies, and Medications have been Reviewed.   Past Medical History:  Diagnosis Date  . Allergy   . Asthma   . Diabetes mellitus without complication (HCC)   . Hyperlipidemia   . OSA on CPAP     Current Meds  Medication Sig  . aspirin EC 81 MG tablet Take 81 mg by mouth daily.  . cetirizine (ZYRTEC) 10 MG tablet Take 10 mg by mouth daily.  . clobetasol ointment (TEMOVATE) 0.05 % Apply 1 application topically 2 (two) times daily as needed.  . ezetimibe (ZETIA) 10 MG tablet Take 1 tablet (10 mg total) by mouth daily.  Marland Kitchen levothyroxine (SYNTHROID) 25 MCG tablet Take 2 tablets (50 mcg total) by mouth daily  before breakfast.  . losartan (COZAAR) 100 MG tablet Take 1 tablet (100 mg total) by mouth daily.  . metFORMIN (GLUCOPHAGE) 500 MG tablet Take 2 tablets (1,000 mg total) by mouth daily with breakfast.  . nystatin cream (MYCOSTATIN) Apply 1 application topically as needed.   . Semaglutide, 1 MG/DOSE, (OZEMPIC, 1 MG/DOSE,) 2 MG/1.5ML SOPN Inject 0.75 mLs (1 mg total) into the skin once a week.  . triamcinolone cream (KENALOG) 0.1 % Apply 1 application topically 2 (two) times daily as needed.    ROS:  Review of Systems  Constitutional: Negative.   HENT: Negative.   Eyes: Negative.   Respiratory: Negative.   Cardiovascular: Negative.   Gastrointestinal: Negative.   Genitourinary: Negative.   Musculoskeletal: Negative.   Skin: Negative.   Neurological: Negative.   Endo/Heme/Allergies: Negative.   Psychiatric/Behavioral: Negative.      Objective:   Today's Vitals: BP 118/78 (BP Location: Left Arm, Patient Position: Sitting, Cuff Size: Normal)   Pulse 62   Temp (!) 97.5 F (36.4 C) (Temporal)   Resp 16   Ht 5\' 3"  (1.6 m)   Wt 158 lb (71.7 kg)   SpO2 98%   BMI 27.99 kg/m  Vitals with BMI 10/27/2019 08/11/2019 05/25/2019  Height 5\' 3"  5\' 3"  5\' 3"   Weight 158 lbs 160 lbs 10 oz 160 lbs  BMI 28 28.46 28.35  Systolic 118 128 683  Diastolic 78 80 84  Pulse 62 55 66     Physical Exam Vitals and nursing note reviewed.  Constitutional:      Appearance: Normal appearance. She is well-developed, well-groomed and overweight.  HENT:     Head: Normocephalic and atraumatic.     Right Ear: External ear normal.     Left Ear: External ear normal.     Mouth/Throat:     Comments: Mask in place  Eyes:     General:        Right eye: No discharge.        Left eye: No discharge.     Conjunctiva/sclera: Conjunctivae normal.  Cardiovascular:     Rate and Rhythm: Normal rate and regular rhythm.     Pulses: Normal pulses.     Heart sounds: Murmur heard.   Pulmonary:     Effort: Pulmonary  effort is normal.     Breath sounds: Normal breath sounds.  Musculoskeletal:        General: Normal range of motion.     Cervical back: Normal range of motion and neck supple.  Skin:    General: Skin is warm.  Neurological:     General: No focal deficit present.     Mental Status: She is alert and oriented to person, place, and time.  Psychiatric:        Attention and Perception: Attention normal.        Mood and Affect: Mood normal.        Speech: Speech normal.        Behavior: Behavior normal. Behavior is cooperative.        Thought Content: Thought content normal.        Cognition and Memory: Cognition normal.        Judgment: Judgment normal.     Assessment   1. Hyperlipidemia, unspecified hyperlipidemia type   2. Hypothyroidism, unspecified type   3. Vitamin D deficiency   4. Eye exam, routine     Tests ordered Orders Placed This Encounter  Procedures  . Comprehensive metabolic panel  . Lipid panel  . TSH  . VITAMIN D 25 Hydroxy (Vit-D Deficiency, Fractures)  . Ambulatory referral to Ophthalmology     Plan: Please see assessment and plan per problem list above.   No orders of the defined types were placed in this encounter.   Patient to follow-up in 6 months for physical no Pap  Sophia Finner, NP

## 2019-10-27 NOTE — Addendum Note (Signed)
Addended by: Freddy Finner on: 10/27/2019 10:16 AM   Modules accepted: Orders

## 2019-10-27 NOTE — Assessment & Plan Note (Signed)
Continues to have elevated cholesterol, does not tolerate statins.  Is on Zetia and is doing lifestyle and diet changes.  We will get updated labs today heart healthy diet and exercise are encouraged.

## 2019-10-28 ENCOUNTER — Other Ambulatory Visit: Payer: Self-pay

## 2019-10-28 DIAGNOSIS — E875 Hyperkalemia: Secondary | ICD-10-CM

## 2019-10-28 LAB — COMPREHENSIVE METABOLIC PANEL
AG Ratio: 2.1 (calc) (ref 1.0–2.5)
ALT: 13 U/L (ref 6–29)
AST: 20 U/L (ref 10–35)
Albumin: 4.6 g/dL (ref 3.6–5.1)
Alkaline phosphatase (APISO): 60 U/L (ref 37–153)
BUN: 12 mg/dL (ref 7–25)
CO2: 29 mmol/L (ref 20–32)
Calcium: 10.4 mg/dL (ref 8.6–10.4)
Chloride: 103 mmol/L (ref 98–110)
Creat: 0.89 mg/dL (ref 0.50–0.99)
Globulin: 2.2 g/dL (calc) (ref 1.9–3.7)
Glucose, Bld: 90 mg/dL (ref 65–99)
Potassium: 5.4 mmol/L — ABNORMAL HIGH (ref 3.5–5.3)
Sodium: 140 mmol/L (ref 135–146)
Total Bilirubin: 0.7 mg/dL (ref 0.2–1.2)
Total Protein: 6.8 g/dL (ref 6.1–8.1)

## 2019-10-28 LAB — LIPID PANEL
Cholesterol: 242 mg/dL — ABNORMAL HIGH (ref <200)
HDL: 55 mg/dL (ref >=50)
LDL Cholesterol (Calc): 156 mg/dL (calc) — ABNORMAL HIGH
Non-HDL Cholesterol (Calc): 187 mg/dL (calc) — ABNORMAL HIGH (ref <130)
Total CHOL/HDL Ratio: 4.4 (calc) (ref <5.0)
Triglycerides: 173 mg/dL — ABNORMAL HIGH (ref <150)

## 2019-10-28 LAB — TSH: TSH: 2.81 mIU/L (ref 0.40–4.50)

## 2019-10-28 LAB — VITAMIN D 25 HYDROXY (VIT D DEFICIENCY, FRACTURES): Vit D, 25-Hydroxy: 40 ng/mL (ref 30–100)

## 2019-10-28 NOTE — Telephone Encounter (Signed)
Kristina Howard was contacted as part of A1c outreach. Left a voicemail message for patient regarding their routine health maintenance.    Patient was provided primary care office phone number and instructed to call for more information.

## 2019-10-29 ENCOUNTER — Encounter: Payer: Self-pay | Admitting: Family Medicine

## 2019-10-30 ENCOUNTER — Other Ambulatory Visit: Payer: Self-pay | Admitting: Family Medicine

## 2019-10-30 DIAGNOSIS — E119 Type 2 diabetes mellitus without complications: Secondary | ICD-10-CM

## 2019-11-01 ENCOUNTER — Other Ambulatory Visit: Payer: Self-pay | Admitting: Family Medicine

## 2019-11-01 DIAGNOSIS — E785 Hyperlipidemia, unspecified: Secondary | ICD-10-CM

## 2019-11-01 MED ORDER — REPATHA 140 MG/ML ~~LOC~~ SOSY: 140.0000 mg | PREFILLED_SYRINGE | SUBCUTANEOUS | 3 refills | Status: DC

## 2019-11-02 ENCOUNTER — Other Ambulatory Visit: Payer: Self-pay | Admitting: Family Medicine

## 2019-11-02 DIAGNOSIS — E039 Hypothyroidism, unspecified: Secondary | ICD-10-CM

## 2019-11-04 ENCOUNTER — Other Ambulatory Visit: Payer: Self-pay | Admitting: *Deleted

## 2019-11-04 ENCOUNTER — Encounter: Payer: Self-pay | Admitting: Family Medicine

## 2019-11-04 DIAGNOSIS — E039 Hypothyroidism, unspecified: Secondary | ICD-10-CM

## 2019-11-04 MED ORDER — LEVOTHYROXINE SODIUM 25 MCG PO TABS: 50.0000 ug | ORAL_TABLET | Freq: Every day | ORAL | 2 refills | Status: DC

## 2019-11-10 DIAGNOSIS — G4733 Obstructive sleep apnea (adult) (pediatric): Secondary | ICD-10-CM | POA: Diagnosis not present

## 2019-11-10 DIAGNOSIS — E875 Hyperkalemia: Secondary | ICD-10-CM | POA: Diagnosis not present

## 2019-11-10 DIAGNOSIS — I10 Essential (primary) hypertension: Secondary | ICD-10-CM | POA: Diagnosis not present

## 2019-11-11 LAB — COMPLETE METABOLIC PANEL WITH GFR
AG Ratio: 2 (calc) (ref 1.0–2.5)
ALT: 14 U/L (ref 6–29)
AST: 17 U/L (ref 10–35)
Albumin: 4.5 g/dL (ref 3.6–5.1)
Alkaline phosphatase (APISO): 57 U/L (ref 37–153)
BUN: 12 mg/dL (ref 7–25)
CO2: 30 mmol/L (ref 20–32)
Calcium: 9.7 mg/dL (ref 8.6–10.4)
Chloride: 104 mmol/L (ref 98–110)
Creat: 0.91 mg/dL (ref 0.50–0.99)
GFR, Est African American: 79 mL/min/{1.73_m2} (ref >=60)
GFR, Est Non African American: 69 mL/min/{1.73_m2} (ref >=60)
Globulin: 2.3 g/dL (calc) (ref 1.9–3.7)
Glucose, Bld: 104 mg/dL — ABNORMAL HIGH (ref 65–99)
Potassium: 5 mmol/L (ref 3.5–5.3)
Sodium: 140 mmol/L (ref 135–146)
Total Bilirubin: 0.7 mg/dL (ref 0.2–1.2)
Total Protein: 6.8 g/dL (ref 6.1–8.1)

## 2019-11-22 ENCOUNTER — Other Ambulatory Visit: Payer: Self-pay | Admitting: Family Medicine

## 2019-11-22 DIAGNOSIS — E119 Type 2 diabetes mellitus without complications: Secondary | ICD-10-CM

## 2019-11-23 MED ORDER — METFORMIN HCL 500 MG PO TABS: 1000.0000 mg | ORAL_TABLET | Freq: Every day | ORAL | 0 refills | Status: DC

## 2019-12-09 ENCOUNTER — Other Ambulatory Visit: Payer: Federal, State, Local not specified - PPO

## 2019-12-09 ENCOUNTER — Telehealth: Payer: Self-pay

## 2019-12-09 DIAGNOSIS — Z20822 Contact with and (suspected) exposure to covid-19: Secondary | ICD-10-CM

## 2019-12-09 NOTE — Telephone Encounter (Signed)
Happy to provide a note.  We will need to have verification if it is negative or positive if she can send [place] a picture via MyChart that would be great

## 2019-12-09 NOTE — Telephone Encounter (Signed)
Pt had a Covid swab today at [employer] and her results should be in by Sunday. She wants to know if she can have a note stating what her results are once she receives that so she can return to work. Will call or come by Monday with her results.

## 2019-12-09 NOTE — Telephone Encounter (Signed)
Pt informed

## 2019-12-10 LAB — SARS-COV-2, NAA 2 DAY TAT

## 2019-12-10 LAB — NOVEL CORONAVIRUS, NAA: SARS-CoV-2, NAA: NOT DETECTED

## 2019-12-11 DIAGNOSIS — G4733 Obstructive sleep apnea (adult) (pediatric): Secondary | ICD-10-CM | POA: Diagnosis not present

## 2019-12-11 DIAGNOSIS — I10 Essential (primary) hypertension: Secondary | ICD-10-CM | POA: Diagnosis not present

## 2020-01-10 DIAGNOSIS — G4733 Obstructive sleep apnea (adult) (pediatric): Secondary | ICD-10-CM | POA: Diagnosis not present

## 2020-01-10 DIAGNOSIS — I10 Essential (primary) hypertension: Secondary | ICD-10-CM | POA: Diagnosis not present

## 2020-01-24 ENCOUNTER — Other Ambulatory Visit: Payer: Self-pay | Admitting: Family Medicine

## 2020-01-24 DIAGNOSIS — E039 Hypothyroidism, unspecified: Secondary | ICD-10-CM

## 2020-01-30 ENCOUNTER — Other Ambulatory Visit: Payer: Self-pay

## 2020-01-30 ENCOUNTER — Other Ambulatory Visit: Payer: Self-pay | Admitting: Family Medicine

## 2020-01-30 ENCOUNTER — Encounter: Payer: Self-pay | Admitting: Nurse Practitioner

## 2020-01-30 ENCOUNTER — Ambulatory Visit (INDEPENDENT_AMBULATORY_CARE_PROVIDER_SITE_OTHER): Payer: Federal, State, Local not specified - PPO | Admitting: Nurse Practitioner

## 2020-01-30 DIAGNOSIS — E039 Hypothyroidism, unspecified: Secondary | ICD-10-CM

## 2020-01-30 DIAGNOSIS — R109 Unspecified abdominal pain: Secondary | ICD-10-CM | POA: Insufficient documentation

## 2020-01-30 DIAGNOSIS — R1011 Right upper quadrant pain: Secondary | ICD-10-CM

## 2020-01-30 NOTE — Patient Instructions (Signed)
You were seen today for abdominal pain.  We drew labs to determine the cause of the pain.  We will notify you of results when we receive them (likely tomorrow).  If your lab work looks perfect, we will plan to meet up in 2 weeks to determine if we need to perform imaging.  Return to the clinic sooner if your pain gets worse.

## 2020-01-30 NOTE — Progress Notes (Signed)
Acute Office Visit  Subjective:    Patient ID: Sophia Robbins, female    DOB: 29-Jul-1960, 60 y.o.   MRN: [id number]  Chief Complaint  Patient presents with  . Abdominal Pain    X 2 weeks     HPI Patient is in today for abdominal pain. The pain is in her right upper quadrant and has been constant for about 2 weeks.  She rates the pain at 5/10.  Nothing makes it worse or better.  She has not tried any medication.  Past Medical History:  Diagnosis Date  . Allergy   . Asthma   . Diabetes mellitus without complication ([place])   . Hyperlipidemia   . OSA on CPAP     Past Surgical History:  Procedure Laterality Date  . CESAREAN SECTION    . CHOLECYSTECTOMY    . EXPLORATION POST OPERATIVE OPEN HEART    . KNEE ARTHROSCOPY AND ARTHROTOMY Left   . TUBAL LIGATION      Family History  Problem Relation Age of Onset  . Cancer Mother   . Cancer Father   . COPD Brother   . Diabetes Sister     Social History   Socioeconomic History  . Marital status: Married    Spouse name: Not on file  . Number of children: 1  . Years of education: Not on file  . Highest education level: Some college, no degree  Occupational History  . Occupation: Dietitian    Comment: [employer]  Tobacco Use  . Smoking status: Former Smoker    Types: Cigarettes    Quit date: 06/20/2012    Years since quitting: 7.6  . Smokeless tobacco: Never Used  Vaping Use  . Vaping Use: Never used  Substance and Sexual Activity  . Alcohol use: Yes    Alcohol/week: 1.0 standard drink    Types: 1 Glasses of wine per week    Comment: occasional  . Drug use: Not Currently  . Sexual activity: Yes    Birth control/protection: Surgical    Comment: tubal  Other Topics Concern  . Not on file  Social History Narrative   Lives with husband of 52 years    1 daughter -granddaughter and grandson      Two cats-outside      Enjoys: crafts, Passenger transport manager, redoing home       Diet: eat all food groups, avoids milk -little  intolerance   Caffeine: 1 cup coffee, unsweet iced tea   Water: 6 cups daily       Wears seat belt   Smoke detectors at home    Does not use phone while driving    Social Determinants of Radio broadcast assistant Strain: Not on file  Food Insecurity: Not on file  Transportation Needs: Not on file  Physical Activity: Not on file  Stress: Not on file  Social Connections: Not on file  Intimate Partner Violence: Not on file    Outpatient Medications Prior to Visit  Medication Sig Dispense Refill  . aspirin EC 81 MG tablet Take 81 mg by mouth daily.    . cetirizine (ZYRTEC) 10 MG tablet Take 10 mg by mouth daily.    . clobetasol ointment (TEMOVATE) 2.87 % Apply 1 application topically 2 (two) times daily as needed.    . Evolocumab (REPATHA) 140 MG/ML SOSY Inject 140 mg into the skin every 14 (fourteen) days. 2.1 mL 3  . ezetimibe (ZETIA) 10 MG tablet Take 1 tablet (10 mg  total) by mouth daily. 90 tablet 2  . fluticasone (FLOVENT HFA) 110 MCG/ACT inhaler Inhale into the lungs as needed.    Marland Kitchen levothyroxine (SYNTHROID) 25 MCG tablet TAKE 2 TABLETS (50 MCG TOTAL) BY MOUTH DAILY BEFORE BREAKFAST 180 tablet 0  . losartan (COZAAR) 100 MG tablet Take 1 tablet (100 mg total) by mouth daily. 90 tablet 2  . metFORMIN (GLUCOPHAGE) 500 MG tablet Take 2 tablets (1,000 mg total) by mouth daily with breakfast. 180 tablet 0  . nystatin cream (MYCOSTATIN) Apply 1 application topically as needed.     . Semaglutide, 1 MG/DOSE, (OZEMPIC, 1 MG/DOSE,) 2 MG/1.5ML SOPN Inject 1 mg into the skin once a week. 18 mL 1  . triamcinolone cream (KENALOG) 0.1 % Apply 1 application topically 2 (two) times daily as needed.     No facility-administered medications prior to visit.    Allergies  Allergen Reactions  . Codeine Hives  . Shellfish Allergy   . Statins     Review of Systems  Constitutional: Negative.   Respiratory: Negative.   Cardiovascular: Negative.   Gastrointestinal: Positive for abdominal  pain. Negative for abdominal distention, anal bleeding, blood in stool, constipation, diarrhea, nausea and vomiting.       Objective:    Physical Exam Constitutional:      Appearance: She is well-developed.  Abdominal:     General: Abdomen is flat. Bowel sounds are normal. There is no distension.     Palpations: Abdomen is soft. There is no splenomegaly or mass.     Tenderness: There is abdominal tenderness in the right upper quadrant. There is no guarding or rebound.  Neurological:     Mental Status: She is alert.     BP (!) 149/66   Pulse 60   Temp 98.6 F (37 C)   Resp 18   Ht '5\' 3"'  (1.6 m)   Wt 156 lb (70.8 kg)   SpO2 97%   BMI 27.63 kg/m  Wt Readings from Last 3 Encounters:  01/30/20 156 lb (70.8 kg)  10/27/19 158 lb (71.7 kg)  08/11/19 160 lb 9.6 oz (72.8 kg)    Health Maintenance Due  Topic Date Due  . Hepatitis C Screening  Never done  . PNEUMOCOCCAL POLYSACCHARIDE VACCINE AGE 33-64 HIGH RISK  Never done  . FOOT EXAM  Never done  . OPHTHALMOLOGY EXAM  Never done  . TETANUS/TDAP  Never done  . COVID-19 Vaccine (3 - Booster for Moderna series) 08/15/2019  . HEMOGLOBIN A1C  10/08/2019    There are no preventive care reminders to display for this patient.   Lab Results  Component Value Date   TSH 2.81 10/27/2019   Lab Results  Component Value Date   WBC 4.3 04/07/2019   HGB 14.3 04/07/2019   HCT 42.2 04/07/2019   MCV 91.1 04/07/2019   PLT 230 04/07/2019   Lab Results  Component Value Date   NA 140 11/10/2019   K 5.0 11/10/2019   CO2 30 11/10/2019   GLUCOSE 104 (H) 11/10/2019   BUN 12 11/10/2019   CREATININE 0.91 11/10/2019   BILITOT 0.7 11/10/2019   AST 17 11/10/2019   ALT 14 11/10/2019   PROT 6.8 11/10/2019   CALCIUM 9.7 11/10/2019   Lab Results  Component Value Date   CHOL 242 (H) 10/27/2019   Lab Results  Component Value Date   HDL 55 10/27/2019   Lab Results  Component Value Date   LDLCALC 156 (H) 10/27/2019   Lab Results  Component Value Date   TRIG 173 (H) 10/27/2019   Lab Results  Component Value Date   CHOLHDL 4.4 10/27/2019   Lab Results  Component Value Date   HGBA1C 5.3 04/07/2019       Assessment & Plan:   Problem List Items Addressed This Visit      Other   Abdominal pain    -unsure of etiology -will draw labs today -if labs are perfect today, will f/u in 2 weeks and consider imaging at that point; f/u sooner if pain gets worse -pain is mild; may consider adding tylenol if labs tolerate; no NSAIDs d/t aspirin therapy      Relevant Orders   CBC with Differential/Platelet   CMP14+EGFR       No orders of the defined types were placed in this encounter.    Noreene Larsson, NP

## 2020-01-30 NOTE — Assessment & Plan Note (Addendum)
-  unsure of etiology -will draw labs today -if labs are perfect today, will f/u in 2 weeks and consider imaging at that point; f/u sooner if pain gets worse -pain is mild; may consider adding tylenol if labs tolerate; no NSAIDs d/t aspirin therapy

## 2020-01-31 ENCOUNTER — Encounter: Payer: Self-pay | Admitting: Adult Health

## 2020-01-31 LAB — CBC WITH DIFFERENTIAL/PLATELET
Basophils Absolute: 0 10*3/uL (ref 0.0–0.2)
Basos: 1 %
EOS (ABSOLUTE): 0.1 10*3/uL (ref 0.0–0.4)
Eos: 2 %
Hematocrit: 41 % (ref 34.0–46.6)
Hemoglobin: 14.2 g/dL (ref 11.1–15.9)
Immature Grans (Abs): 0 10*3/uL (ref 0.0–0.1)
Immature Granulocytes: 0 %
Lymphocytes Absolute: 2.1 10*3/uL (ref 0.7–3.1)
Lymphs: 32 %
MCH: 31.8 pg (ref 26.6–33.0)
MCHC: 34.6 g/dL (ref 31.5–35.7)
MCV: 92 fL (ref 79–97)
Monocytes Absolute: 0.4 10*3/uL (ref 0.1–0.9)
Monocytes: 5 %
Neutrophils Absolute: 4.1 10*3/uL (ref 1.4–7.0)
Neutrophils: 60 %
Platelets: 236 10*3/uL (ref 150–450)
RBC: 4.47 x10E6/uL (ref 3.77–5.28)
RDW: 12.3 % (ref 11.7–15.4)
WBC: 6.6 10*3/uL (ref 3.4–10.8)

## 2020-01-31 LAB — CMP14+EGFR
ALT: 15 IU/L (ref 0–32)
AST: 18 IU/L (ref 0–40)
Albumin/Globulin Ratio: 2 (ref 1.2–2.2)
Albumin: 4.6 g/dL (ref 3.8–4.9)
Alkaline Phosphatase: 73 IU/L (ref 44–121)
BUN/Creatinine Ratio: 17 (ref 12–28)
BUN: 14 mg/dL (ref 8–27)
Bilirubin Total: 0.3 mg/dL (ref 0.0–1.2)
CO2: 26 mmol/L (ref 20–29)
Calcium: 10 mg/dL (ref 8.7–10.3)
Chloride: 105 mmol/L (ref 96–106)
Creatinine, Ser: 0.81 mg/dL (ref 0.57–1.00)
GFR calc Af Amer: 91 mL/min/{1.73_m2} (ref >59)
GFR calc non Af Amer: 79 mL/min/{1.73_m2} (ref >59)
Globulin, Total: 2.3 g/dL (ref 1.5–4.5)
Glucose: 102 mg/dL — ABNORMAL HIGH (ref 65–99)
Potassium: 4.6 mmol/L (ref 3.5–5.2)
Sodium: 142 mmol/L (ref 134–144)
Total Protein: 6.9 g/dL (ref 6.0–8.5)

## 2020-01-31 MED ORDER — LEVOTHYROXINE SODIUM 25 MCG PO TABS
50.0000 ug | ORAL_TABLET | Freq: Every day | ORAL | 0 refills | Status: DC
Start: 2020-01-31 — End: 2020-07-27

## 2020-01-31 NOTE — Progress Notes (Signed)
No issues on labs. We will meet back up soon if the pain persists and we can consider imaging then.

## 2020-02-09 ENCOUNTER — Other Ambulatory Visit: Payer: Self-pay | Admitting: Family Medicine

## 2020-02-09 DIAGNOSIS — E119 Type 2 diabetes mellitus without complications: Secondary | ICD-10-CM

## 2020-02-10 DIAGNOSIS — G4733 Obstructive sleep apnea (adult) (pediatric): Secondary | ICD-10-CM | POA: Diagnosis not present

## 2020-02-10 DIAGNOSIS — I10 Essential (primary) hypertension: Secondary | ICD-10-CM | POA: Diagnosis not present

## 2020-02-13 ENCOUNTER — Encounter: Payer: Self-pay | Admitting: Nurse Practitioner

## 2020-02-13 ENCOUNTER — Ambulatory Visit: Payer: Federal, State, Local not specified - PPO | Admitting: Nurse Practitioner

## 2020-02-13 ENCOUNTER — Encounter (INDEPENDENT_AMBULATORY_CARE_PROVIDER_SITE_OTHER): Payer: Self-pay | Admitting: *Deleted

## 2020-02-13 ENCOUNTER — Other Ambulatory Visit: Payer: Self-pay

## 2020-02-13 VITALS — BP 157/81 | HR 61 | Temp 98.5°F | Resp 20 | Ht 63.0 in | Wt 157.0 lb

## 2020-02-13 DIAGNOSIS — R1011 Right upper quadrant pain: Secondary | ICD-10-CM

## 2020-02-13 MED ORDER — PANTOPRAZOLE SODIUM 40 MG PO TBEC: 40.0000 mg | DELAYED_RELEASE_TABLET | Freq: Every day | ORAL | 1 refills | Status: DC

## 2020-02-13 MED ORDER — OMEPRAZOLE 40 MG PO CPDR: 40.0000 mg | DELAYED_RELEASE_CAPSULE | Freq: Every day | ORAL | 3 refills | Status: DC

## 2020-02-13 NOTE — Patient Instructions (Signed)
You were seen today for abdominal pain.  We started pantoprazole for your pain.  I also ordered an ultrasound of your abdomen to see if we can find the cause of the pain.  I put in a referral to a GI doctor.  If the pantoprazole helps with your pain, you may cancel the GI appointment.

## 2020-02-13 NOTE — Assessment & Plan Note (Signed)
-  since pain is worse after eating will ad on PPI -Rx. Pantoprazole -will get U/S abd to r/o gallbladder issues -refer to GI

## 2020-02-13 NOTE — Progress Notes (Signed)
Acute Office Visit  Subjective:    Patient ID: Sophia Robbins, female    DOB: 12-Apr-1960, 60 y.o.   MRN: [id number]  Chief Complaint  Patient presents with  . Abdominal Pain    X 6 weeks; no change; worse after eating.     HPI Patient is in today for follow-up for abdominal pain. She was seen 2 weeks ago for RUQ pain 5/10. She had not tried any medication and there were no alleviating/aggravating factors.   We drew labs and her CBC and CMP were unremarkable. Her pain is the same, and she states that she notices it more after she eats.  Past Medical History:  Diagnosis Date  . Allergy   . Asthma   . Diabetes mellitus without complication ([facility])   . Hyperlipidemia   . OSA on CPAP     Past Surgical History:  Procedure Laterality Date  . CESAREAN SECTION    . CHOLECYSTECTOMY    . EXPLORATION POST OPERATIVE OPEN HEART    . KNEE ARTHROSCOPY AND ARTHROTOMY Left   . TUBAL LIGATION      Family History  Problem Relation Age of Onset  . Cancer Mother   . Cancer Father   . COPD Brother   . Diabetes Sister     Social History   Socioeconomic History  . Marital status: Married    Spouse name: Not on file  . Number of children: 1  . Years of education: Not on file  . Highest education level: Some college, no degree  Occupational History  . Occupation: Public relations account executive    Comment: [employer]  Tobacco Use  . Smoking status: Former Smoker    Types: Cigarettes    Quit date: 06/20/2012    Years since quitting: 7.6  . Smokeless tobacco: Never Used  Vaping Use  . Vaping Use: Never used  Substance and Sexual Activity  . Alcohol use: Yes    Alcohol/week: 1.0 standard drink    Types: 1 Glasses of wine per week    Comment: occasional  . Drug use: Not Currently  . Sexual activity: Yes    Birth control/protection: Surgical    Comment: tubal  Other Topics Concern  . Not on file  Social History Narrative   Lives with husband of 37 years    1 daughter -granddaughter and  grandson      Two cats-outside      Enjoys: crafts, Web designer, redoing home       Diet: eat all food groups, avoids milk -little intolerance   Caffeine: 1 cup coffee, unsweet iced tea   Water: 6 cups daily       Wears seat belt   Smoke detectors at home    Does not use phone while driving    Social Determinants of Corporate investment banker Strain: Not on file  Food Insecurity: Not on file  Transportation Needs: Not on file  Physical Activity: Not on file  Stress: Not on file  Social Connections: Not on file  Intimate Partner Violence: Not on file    Outpatient Medications Prior to Visit  Medication Sig Dispense Refill  . aspirin EC 81 MG tablet Take 81 mg by mouth daily.    . cetirizine (ZYRTEC) 10 MG tablet Take 10 mg by mouth daily.    . clobetasol ointment (TEMOVATE) 0.05 % Apply 1 application topically 2 (two) times daily as needed.    . Evolocumab (REPATHA) 140 MG/ML SOSY Inject 140 mg into the  skin every 14 (fourteen) days. 2.1 mL 3  . ezetimibe (ZETIA) 10 MG tablet Take 1 tablet (10 mg total) by mouth daily. 90 tablet 2  . fluticasone (FLOVENT HFA) 110 MCG/ACT inhaler Inhale into the lungs as needed.    Marland Kitchen levothyroxine (SYNTHROID) 25 MCG tablet Take 2 tablets (50 mcg total) by mouth daily before breakfast. 180 tablet 0  . losartan (COZAAR) 100 MG tablet Take 1 tablet (100 mg total) by mouth daily. 90 tablet 2  . metFORMIN (GLUCOPHAGE) 500 MG tablet TAKE 2 TABLETS BY MOUTH EVERY DAY WITH BREAKFAST 180 tablet 0  . nystatin cream (MYCOSTATIN) Apply 1 application topically as needed.     . Semaglutide, 1 MG/DOSE, (OZEMPIC, 1 MG/DOSE,) 2 MG/1.5ML SOPN Inject 1 mg into the skin once a week. 18 mL 1  . triamcinolone cream (KENALOG) 0.1 % Apply 1 application topically 2 (two) times daily as needed.     No facility-administered medications prior to visit.    Allergies  Allergen Reactions  . Codeine Hives  . Shellfish Allergy   . Statins     Review of Systems   Constitutional: Negative.   Respiratory: Negative.   Cardiovascular: Negative.   Gastrointestinal: Positive for abdominal pain. Negative for constipation, diarrhea, nausea and vomiting.       Objective:    Physical Exam Constitutional:      Appearance: She is well-developed.  Cardiovascular:     Rate and Rhythm: Normal rate and regular rhythm.     Heart sounds: Normal heart sounds.  Pulmonary:     Effort: Pulmonary effort is normal.     Breath sounds: Normal breath sounds.  Abdominal:     General: Bowel sounds are normal.     Palpations: Abdomen is soft. There is no shifting dullness, fluid wave, hepatomegaly, splenomegaly or mass.     Tenderness: There is abdominal tenderness in the right upper quadrant.     Hernia: No hernia is present.  Neurological:     Mental Status: She is alert.     BP (!) 157/81   Pulse 61   Temp 98.5 F (36.9 C)   Resp 20   Ht 5\' 3"  (1.6 m)   Wt 157 lb (71.2 kg)   SpO2 98%   BMI 27.81 kg/m  Wt Readings from Last 3 Encounters:  02/13/20 157 lb (71.2 kg)  01/30/20 156 lb (70.8 kg)  10/27/19 158 lb (71.7 kg)    Health Maintenance Due  Topic Date Due  . Hepatitis C Screening  Never done  . PNEUMOCOCCAL POLYSACCHARIDE VACCINE AGE 58-64 HIGH RISK  Never done  . FOOT EXAM  Never done  . OPHTHALMOLOGY EXAM  Never done  . TETANUS/TDAP  Never done  . COVID-19 Vaccine (3 - Booster for Moderna series) 08/15/2019  . HEMOGLOBIN A1C  10/08/2019    There are no preventive care reminders to display for this patient.   Lab Results  Component Value Date   TSH 2.81 10/27/2019   Lab Results  Component Value Date   WBC 6.6 01/30/2020   HGB 14.2 01/30/2020   HCT 41.0 01/30/2020   MCV 92 01/30/2020   PLT 236 01/30/2020   Lab Results  Component Value Date   NA 142 01/30/2020   K 4.6 01/30/2020   CO2 26 01/30/2020   GLUCOSE 102 (H) 01/30/2020   BUN 14 01/30/2020   CREATININE 0.81 01/30/2020   BILITOT 0.3 01/30/2020   ALKPHOS 73  01/30/2020   AST 18 01/30/2020  ALT 15 01/30/2020   PROT 6.9 01/30/2020   ALBUMIN 4.6 01/30/2020   CALCIUM 10.0 01/30/2020   Lab Results  Component Value Date   CHOL 242 (H) 10/27/2019   Lab Results  Component Value Date   HDL 55 10/27/2019   Lab Results  Component Value Date   LDLCALC 156 (H) 10/27/2019   Lab Results  Component Value Date   TRIG 173 (H) 10/27/2019   Lab Results  Component Value Date   CHOLHDL 4.4 10/27/2019   Lab Results  Component Value Date   HGBA1C 5.3 04/07/2019       Assessment & Plan:   Problem List Items Addressed This Visit      Other   Abdominal pain    -since pain is worse after eating will ad on PPI -Rx. Pantoprazole -will get U/S abd to r/o gallbladder issues -refer to GI        Other Visit Diagnoses    RUQ pain    -  Primary   Relevant Orders   US Abdomen Complete   Ambulatory referral to Gastroenterology       Meds ordered this encounter  Medications  . DISCONTD: pantoprazole (PROTONIX) 40 MG tablet    Sig: Take 1 tablet (40 mg total) by mouth daily.    Dispense:  30 tablet    Refill:  1  . pantoprazole (PROTONIX) 40 MG tablet    Sig: Take 1 tablet (40 mg total) by mouth daily.    Dispense:  30 tablet    Refill:  1     Heather Roberts, NP

## 2020-02-21 ENCOUNTER — Other Ambulatory Visit: Payer: Self-pay

## 2020-02-21 ENCOUNTER — Ambulatory Visit (HOSPITAL_COMMUNITY)
Admission: RE | Admit: 2020-02-21 | Discharge: 2020-02-21 | Disposition: A | Discharge status: Home / Self Care | Payer: Federal, State, Local not specified - PPO | Source: Ambulatory Visit | Attending: Nurse Practitioner | Admitting: Nurse Practitioner

## 2020-02-21 DIAGNOSIS — R1011 Right upper quadrant pain: Secondary | ICD-10-CM | POA: Insufficient documentation

## 2020-02-21 DIAGNOSIS — K824 Cholesterolosis of gallbladder: Secondary | ICD-10-CM | POA: Diagnosis not present

## 2020-02-21 NOTE — Progress Notes (Signed)
Good news! The ultrasound came back, and there are no gallstones or gallbladder issues. Please follow-up with the gastroenterologist.

## 2020-02-23 ENCOUNTER — Telehealth: Payer: Self-pay

## 2020-02-23 NOTE — Telephone Encounter (Signed)
Pt is returning a call  

## 2020-02-23 NOTE — Telephone Encounter (Signed)
PT is returning a call regarding imaging

## 2020-02-23 NOTE — Telephone Encounter (Signed)
Pt informed

## 2020-02-23 NOTE — Telephone Encounter (Signed)
Spoke with pt about results

## 2020-03-02 LAB — HM DIABETES EYE EXAM

## 2020-03-05 LAB — HM DIABETES EYE EXAM

## 2020-03-12 DIAGNOSIS — G4733 Obstructive sleep apnea (adult) (pediatric): Secondary | ICD-10-CM | POA: Diagnosis not present

## 2020-03-12 DIAGNOSIS — I10 Essential (primary) hypertension: Secondary | ICD-10-CM | POA: Diagnosis not present

## 2020-04-09 DIAGNOSIS — G4733 Obstructive sleep apnea (adult) (pediatric): Secondary | ICD-10-CM | POA: Diagnosis not present

## 2020-04-09 DIAGNOSIS — I10 Essential (primary) hypertension: Secondary | ICD-10-CM | POA: Diagnosis not present

## 2020-04-11 ENCOUNTER — Encounter: Payer: Self-pay | Admitting: Nurse Practitioner

## 2020-04-11 ENCOUNTER — Other Ambulatory Visit: Payer: Self-pay

## 2020-04-11 ENCOUNTER — Ambulatory Visit (INDEPENDENT_AMBULATORY_CARE_PROVIDER_SITE_OTHER): Payer: Federal, State, Local not specified - PPO | Admitting: Nurse Practitioner

## 2020-04-11 VITALS — BP 154/77 | HR 58 | Temp 98.5°F | Resp 20 | Ht 63.0 in | Wt 160.0 lb

## 2020-04-11 DIAGNOSIS — R1011 Right upper quadrant pain: Secondary | ICD-10-CM

## 2020-04-11 DIAGNOSIS — I10 Essential (primary) hypertension: Secondary | ICD-10-CM

## 2020-04-11 DIAGNOSIS — Z0001 Encounter for general adult medical examination with abnormal findings: Secondary | ICD-10-CM | POA: Diagnosis not present

## 2020-04-11 DIAGNOSIS — Z139 Encounter for screening, unspecified: Secondary | ICD-10-CM

## 2020-04-11 DIAGNOSIS — E039 Hypothyroidism, unspecified: Secondary | ICD-10-CM

## 2020-04-11 DIAGNOSIS — E119 Type 2 diabetes mellitus without complications: Secondary | ICD-10-CM | POA: Diagnosis not present

## 2020-04-11 MED ORDER — GLUCOSE BLOOD VI STRP: ORAL_STRIP | 12 refills | Status: AC

## 2020-04-11 NOTE — Assessment & Plan Note (Signed)
-  eating well -taking ozempic, and metformin -last A1c was great; will check labs today

## 2020-04-11 NOTE — Assessment & Plan Note (Signed)
-  BP elevated today -has been taking losartan

## 2020-04-11 NOTE — Assessment & Plan Note (Signed)
-  continue omeprazole with some improvement in her symptoms -has upcoming GI appt

## 2020-04-11 NOTE — Assessment & Plan Note (Signed)
-  will check labs today 

## 2020-04-11 NOTE — Patient Instructions (Signed)
Please have fasting labs drawn today, and we will call with results.

## 2020-04-11 NOTE — Progress Notes (Signed)
Established Patient Office Visit  Subjective:  Patient ID: Sophia Robbins, female    DOB: [birth year]  Age: 60 y.o. MRN: [id number]  CC:  Chief Complaint  Patient presents with  . Annual Exam    HPI [name] presents for physical exam. She is having some left knee pain after twisting her knee about 1 week ago. She has been taking naproxen, and it has been feeling better.  Past Medical History:  Diagnosis Date  . Allergy   . Asthma   . Diabetes mellitus without complication ([place])   . Hyperlipidemia   . OSA on CPAP     Past Surgical History:  Procedure Laterality Date  . CESAREAN SECTION    . CHOLECYSTECTOMY    . EXPLORATION POST OPERATIVE OPEN HEART    . KNEE ARTHROSCOPY AND ARTHROTOMY Left   . TUBAL LIGATION      Family History  Problem Relation Age of Onset  . Cancer Mother   . Cancer Father   . COPD Brother   . Diabetes Sister     Social History   Socioeconomic History  . Marital status: Married    Spouse name: Not on file  . Number of children: 1  . Years of education: Not on file  . Highest education level: Some college, no degree  Occupational History  . Occupation: Dietitian    Comment: [employer]  Tobacco Use  . Smoking status: Former Smoker    Types: Cigarettes    Quit date: 06/20/2012    Years since quitting: 7.8  . Smokeless tobacco: Never Used  Vaping Use  . Vaping Use: Never used  Substance and Sexual Activity  . Alcohol use: Yes    Alcohol/week: 1.0 standard drink    Types: 1 Glasses of wine per week    Comment: occasional  . Drug use: Not Currently  . Sexual activity: Yes    Birth control/protection: Surgical    Comment: tubal  Other Topics Concern  . Not on file  Social History Narrative   Lives with husband of 79 years    1 daughter -granddaughter and grandson      Two cats-outside      Enjoys: crafts, Passenger transport manager, redoing home       Diet: eat all food groups, avoids milk -little intolerance   Caffeine: 1 cup coffee,  unsweet iced tea   Water: 6 cups daily       Wears seat belt   Smoke detectors at home    Does not use phone while driving    Social Determinants of Radio broadcast assistant Strain: Not on file  Food Insecurity: Not on file  Transportation Needs: Not on file  Physical Activity: Not on file  Stress: Not on file  Social Connections: Not on file  Intimate Partner Violence: Not on file    Outpatient Medications Prior to Visit  Medication Sig Dispense Refill  . aspirin EC 81 MG tablet Take 81 mg by mouth daily.    . cetirizine (ZYRTEC) 10 MG tablet Take 10 mg by mouth daily.    . clobetasol ointment (TEMOVATE) 5.28 % Apply 1 application topically 2 (two) times daily as needed.    . Evolocumab (REPATHA) 140 MG/ML SOSY Inject 140 mg into the skin every 14 (fourteen) days. 2.1 mL 3  . ezetimibe (ZETIA) 10 MG tablet Take 1 tablet (10 mg total) by mouth daily. 90 tablet 2  . fluticasone (FLOVENT HFA) 110 MCG/ACT inhaler Inhale into the  lungs as needed.    Marland Kitchen levothyroxine (SYNTHROID) 25 MCG tablet Take 2 tablets (50 mcg total) by mouth daily before breakfast. 180 tablet 0  . losartan (COZAAR) 100 MG tablet Take 1 tablet (100 mg total) by mouth daily. 90 tablet 2  . metFORMIN (GLUCOPHAGE) 500 MG tablet TAKE 2 TABLETS BY MOUTH EVERY DAY WITH BREAKFAST 180 tablet 0  . nystatin cream (MYCOSTATIN) Apply 1 application topically as needed.     Marland Kitchen omeprazole (PRILOSEC) 40 MG capsule Take 1 capsule (40 mg total) by mouth daily. 30 capsule 3  . Semaglutide, 1 MG/DOSE, (OZEMPIC, 1 MG/DOSE,) 2 MG/1.5ML SOPN Inject 1 mg into the skin once a week. 18 mL 1  . triamcinolone cream (KENALOG) 0.1 % Apply 1 application topically 2 (two) times daily as needed.     No facility-administered medications prior to visit.    Allergies  Allergen Reactions  . Other Itching, Rash, Shortness Of Breath and Swelling  . Codeine Hives  . Shellfish Allergy   . Statins     ROS Review of Systems  Constitutional:  Negative.   HENT: Negative.   Eyes: Negative.   Respiratory: Negative.   Cardiovascular: Negative.   Gastrointestinal: Positive for abdominal pain. Negative for constipation, diarrhea, nausea and vomiting.       Somewhat better with omeprazole  Endocrine: Negative.   Genitourinary: Negative.   Musculoskeletal: Negative.   Skin: Negative.   Allergic/Immunologic: Negative.   Neurological: Negative.   Hematological: Negative.   Psychiatric/Behavioral: Negative.       Objective:    Physical Exam Constitutional:      Appearance: Normal appearance.  HENT:     Head: Normocephalic and atraumatic.     Right Ear: Tympanic membrane, ear canal and external ear normal.     Left Ear: Tympanic membrane, ear canal and external ear normal.     Nose: Nose normal.     Mouth/Throat:     Mouth: Mucous membranes are moist.     Pharynx: Oropharynx is clear.  Eyes:     Extraocular Movements: Extraocular movements intact.     Conjunctiva/sclera: Conjunctivae normal.     Pupils: Pupils are equal, round, and reactive to light.  Cardiovascular:     Rate and Rhythm: Normal rate and regular rhythm.     Pulses: Normal pulses.          Dorsalis pedis pulses are 2+ on the right side and 2+ on the left side.       Posterior tibial pulses are 2+ on the right side and 2+ on the left side.     Heart sounds: Normal heart sounds.  Pulmonary:     Effort: Pulmonary effort is normal.     Breath sounds: Normal breath sounds.  Abdominal:     General: Abdomen is flat. Bowel sounds are normal. There is no distension.     Palpations: Abdomen is soft. There is no mass.     Tenderness: There is no abdominal tenderness. There is no rebound.     Hernia: No hernia is present.  Musculoskeletal:        General: Tenderness present.     Cervical back: Normal range of motion and neck supple.     Comments: Left knee tenderness with valgus stress  Feet:     Right foot:     Protective Sensation: 10 sites tested. 10 sites  sensed.     Skin integrity: Skin integrity normal.     Toenail Condition: Right toenails  are normal.     Left foot:     Protective Sensation: 10 sites tested. 10 sites sensed.     Skin integrity: Skin integrity normal.     Toenail Condition: Left toenails are normal.  Skin:    General: Skin is warm and dry.     Capillary Refill: Capillary refill takes less than 2 seconds.  Neurological:     General: No focal deficit present.     Mental Status: She is alert and oriented to person, place, and time.  Psychiatric:        Mood and Affect: Mood normal.        Behavior: Behavior normal.        Thought Content: Thought content normal.        Judgment: Judgment normal.     BP (!) 154/77   Pulse (!) 58   Temp 98.5 F (36.9 C)   Resp 20   Ht '5\' 3"'  (1.6 m)   Wt 160 lb (72.6 kg)   SpO2 96%   BMI 28.34 kg/m  Wt Readings from Last 3 Encounters:  04/11/20 160 lb (72.6 kg)  02/13/20 157 lb (71.2 kg)  01/30/20 156 lb (70.8 kg)     Health Maintenance Due  Topic Date Due  . Hepatitis C Screening  Never done  . PNEUMOCOCCAL POLYSACCHARIDE VACCINE AGE 32-64 HIGH RISK  Never done  . TETANUS/TDAP  Never done  . HEMOGLOBIN A1C  10/08/2019    There are no preventive care reminders to display for this patient.  Lab Results  Component Value Date   TSH 2.81 10/27/2019   Lab Results  Component Value Date   WBC 6.6 01/30/2020   HGB 14.2 01/30/2020   HCT 41.0 01/30/2020   MCV 92 01/30/2020   PLT 236 01/30/2020   Lab Results  Component Value Date   NA 142 01/30/2020   K 4.6 01/30/2020   CO2 26 01/30/2020   GLUCOSE 102 (H) 01/30/2020   BUN 14 01/30/2020   CREATININE 0.81 01/30/2020   BILITOT 0.3 01/30/2020   ALKPHOS 73 01/30/2020   AST 18 01/30/2020   ALT 15 01/30/2020   PROT 6.9 01/30/2020   ALBUMIN 4.6 01/30/2020   CALCIUM 10.0 01/30/2020   Lab Results  Component Value Date   CHOL 242 (H) 10/27/2019   Lab Results  Component Value Date   HDL 55 10/27/2019   Lab  Results  Component Value Date   LDLCALC 156 (H) 10/27/2019   Lab Results  Component Value Date   TRIG 173 (H) 10/27/2019   Lab Results  Component Value Date   CHOLHDL 4.4 10/27/2019   Lab Results  Component Value Date   HGBA1C 5.3 04/07/2019      Assessment & Plan:   Problem List Items Addressed This Visit      Cardiovascular and Mediastinum   Essential hypertension    -BP elevated today -has been taking losartan        Endocrine   Diabetes mellitus without complication ([place]) - Primary    -eating well -taking ozempic, and metformin -last A1c was great; will check labs today      Relevant Medications   glucose blood test strip   Other Relevant Orders   CBC with Differential/Platelet   CMP14+EGFR   Hemoglobin A1c   Lipid Panel With LDL/HDL Ratio   Microalbumin / creatinine urine ratio   Hypothyroidism    -will check labs today      Relevant Orders  TSH + free T4     Other   Annual visit for general adult medical examination with abnormal findings   Relevant Orders   CBC with Differential/Platelet   CMP14+EGFR   Hemoglobin A1c   Lipid Panel With LDL/HDL Ratio   Microalbumin / creatinine urine ratio   TSH + free T4   HCV Ab w/Rflx to Verification   Abdominal pain    -continue omeprazole with some improvement in her symptoms -has upcoming GI appt       Other Visit Diagnoses    Screening due       Relevant Orders   HCV Ab w/Rflx to Verification      Meds ordered this encounter  Medications  . glucose blood test strip    Sig: Use as instructed    Dispense:  100 each    Refill:  12    One Touch Ultra Test Strips - test once daily and PRN    Follow-up: Return in about 4 months (around 08/11/2020) for Lab follow-up (diabetes. THN and thyroid).    Noreene Larsson, NP

## 2020-04-12 NOTE — Progress Notes (Signed)
A1c is in the prediabetes range, so no need to change meds. Her LDL, or bad cholesterol, is still elevated.  I know she is statin intolerant, but I can send in a pill, zetia, which is not a statin, or repatha, which is an injection that is not a statin, if she would be interested. Repatha would probably be $40-50 per month, and zetia would likely be cheaper than that, but repatha would be the strongest at lowering cholesterol.

## 2020-04-13 LAB — CBC WITH DIFFERENTIAL/PLATELET
Basophils Absolute: 0 10*3/uL (ref 0.0–0.2)
Basos: 1 %
EOS (ABSOLUTE): 0.3 10*3/uL (ref 0.0–0.4)
Eos: 4 %
Hematocrit: 41.7 % (ref 34.0–46.6)
Hemoglobin: 13.9 g/dL (ref 11.1–15.9)
Immature Grans (Abs): 0 10*3/uL (ref 0.0–0.1)
Immature Granulocytes: 0 %
Lymphocytes Absolute: 1.7 10*3/uL (ref 0.7–3.1)
Lymphs: 28 %
MCH: 30.9 pg (ref 26.6–33.0)
MCHC: 33.3 g/dL (ref 31.5–35.7)
MCV: 93 fL (ref 79–97)
Monocytes Absolute: 0.4 10*3/uL (ref 0.1–0.9)
Monocytes: 6 %
Neutrophils Absolute: 3.8 10*3/uL (ref 1.4–7.0)
Neutrophils: 61 %
Platelets: 232 10*3/uL (ref 150–450)
RBC: 4.5 x10E6/uL (ref 3.77–5.28)
RDW: 12.1 % (ref 11.7–15.4)
WBC: 6.2 10*3/uL (ref 3.4–10.8)

## 2020-04-13 LAB — HCV INTERPRETATION

## 2020-04-13 LAB — HCV AB W/RFLX TO VERIFICATION: HCV Ab: <0.1 s/co ratio (ref 0.0–0.9)

## 2020-04-13 LAB — CMP14+EGFR
ALT: 13 IU/L (ref 0–32)
AST: 19 IU/L (ref 0–40)
Albumin/Globulin Ratio: 2.3 — ABNORMAL HIGH (ref 1.2–2.2)
Albumin: 4.8 g/dL (ref 3.8–4.8)
Alkaline Phosphatase: 89 IU/L (ref 44–121)
BUN/Creatinine Ratio: 17 (ref 12–28)
BUN: 14 mg/dL (ref 8–27)
Bilirubin Total: 0.3 mg/dL (ref 0.0–1.2)
CO2: 24 mmol/L (ref 20–29)
Calcium: 10.1 mg/dL (ref 8.7–10.3)
Chloride: 100 mmol/L (ref 96–106)
Creatinine, Ser: 0.83 mg/dL (ref 0.57–1.00)
Globulin, Total: 2.1 g/dL (ref 1.5–4.5)
Glucose: 110 mg/dL — ABNORMAL HIGH (ref 65–99)
Potassium: 5.1 mmol/L (ref 3.5–5.2)
Sodium: 140 mmol/L (ref 134–144)
Total Protein: 6.9 g/dL (ref 6.0–8.5)
eGFR: 80 mL/min/{1.73_m2} (ref >59)

## 2020-04-13 LAB — MICROALBUMIN / CREATININE URINE RATIO
Creatinine, Urine: 83.6 mg/dL
Microalb/Creat Ratio: <4 mg/g creat (ref 0–29)
Microalbumin, Urine: <3 ug/mL

## 2020-04-13 LAB — HEMOGLOBIN A1C
Est. average glucose Bld gHb Est-mCnc: 117 mg/dL
Hgb A1c MFr Bld: 5.7 % — ABNORMAL HIGH (ref 4.8–5.6)

## 2020-04-13 LAB — LIPID PANEL WITH LDL/HDL RATIO
Cholesterol, Total: 250 mg/dL — ABNORMAL HIGH (ref 100–199)
HDL: 59 mg/dL (ref >39)
LDL Chol Calc (NIH): 152 mg/dL — ABNORMAL HIGH (ref 0–99)
LDL/HDL Ratio: 2.6 ratio (ref 0.0–3.2)
Triglycerides: 215 mg/dL — ABNORMAL HIGH (ref 0–149)
VLDL Cholesterol Cal: 39 mg/dL (ref 5–40)

## 2020-04-13 LAB — TSH+FREE T4
Free T4: 1.15 ng/dL (ref 0.82–1.77)
TSH: 1.61 u[IU]/mL (ref 0.450–4.500)

## 2020-04-19 ENCOUNTER — Telehealth: Payer: Self-pay

## 2020-04-19 ENCOUNTER — Other Ambulatory Visit: Payer: Self-pay | Admitting: Nurse Practitioner

## 2020-04-19 NOTE — Telephone Encounter (Signed)
Patient returning call. # [phone]

## 2020-04-19 NOTE — Progress Notes (Signed)
Her medicine list has zetia (ezetimibe) and repatha on it. Is she actually taking both of these?

## 2020-04-20 NOTE — Telephone Encounter (Signed)
There is no documentation of phone call please advise

## 2020-04-23 NOTE — Telephone Encounter (Signed)
See lab note.  

## 2020-04-24 ENCOUNTER — Other Ambulatory Visit: Payer: Self-pay | Admitting: Nurse Practitioner

## 2020-04-24 MED ORDER — REPATHA SURECLICK 140 MG/ML ~~LOC~~ SOAJ: 140.0000 mg | SUBCUTANEOUS | 3 refills | Status: DC

## 2020-04-24 NOTE — Progress Notes (Signed)
I sent it in. It is a branded medication, so it should be between $40-50. If this is too much, our options are limited given statin intolerance.

## 2020-04-25 ENCOUNTER — Telehealth: Payer: Self-pay

## 2020-04-25 ENCOUNTER — Other Ambulatory Visit: Payer: Self-pay | Admitting: Family Medicine

## 2020-04-25 ENCOUNTER — Other Ambulatory Visit: Payer: Self-pay | Admitting: Nurse Practitioner

## 2020-04-25 DIAGNOSIS — E119 Type 2 diabetes mellitus without complications: Secondary | ICD-10-CM

## 2020-04-25 NOTE — Telephone Encounter (Signed)
Patient called said the Raphatha 140 mg is too expensive. She will lower her diet and try without this medication.

## 2020-04-25 NOTE — Telephone Encounter (Signed)
FYI

## 2020-04-25 NOTE — Telephone Encounter (Signed)
Ok

## 2020-04-27 ENCOUNTER — Telehealth: Payer: Self-pay

## 2020-04-27 ENCOUNTER — Other Ambulatory Visit: Payer: Self-pay

## 2020-04-27 DIAGNOSIS — M25562 Pain in left knee: Secondary | ICD-10-CM

## 2020-04-27 NOTE — Telephone Encounter (Signed)
Left knee is no better, pt would like a referral to orthopaedics, [facility] in [place] is fine.

## 2020-04-27 NOTE — Telephone Encounter (Signed)
Referral sent 

## 2020-05-01 ENCOUNTER — Encounter: Payer: Self-pay | Admitting: Orthopaedic Surgery

## 2020-05-01 ENCOUNTER — Other Ambulatory Visit: Payer: Self-pay

## 2020-05-01 ENCOUNTER — Other Ambulatory Visit: Payer: Self-pay | Admitting: Nurse Practitioner

## 2020-05-01 ENCOUNTER — Ambulatory Visit: Payer: Federal, State, Local not specified - PPO | Admitting: Orthopaedic Surgery

## 2020-05-01 ENCOUNTER — Encounter: Payer: Self-pay | Admitting: Nurse Practitioner

## 2020-05-01 ENCOUNTER — Ambulatory Visit: Payer: Federal, State, Local not specified - PPO

## 2020-05-01 VITALS — BP 162/81 | HR 70 | Ht 63.0 in | Wt 155.1 lb

## 2020-05-01 DIAGNOSIS — G8929 Other chronic pain: Secondary | ICD-10-CM | POA: Diagnosis not present

## 2020-05-01 DIAGNOSIS — M25562 Pain in left knee: Secondary | ICD-10-CM

## 2020-05-01 NOTE — Progress Notes (Signed)
Subjective:    Patient ID: Sophia Robbins, female    DOB: September 13, 1960, 60 y.o.   MRN: [id number]  HPI She took a misstep about four weeks ago and hurt her left knee. She has had swelling, popping and some slight giving way.  She has no redness. She had arthroscopy to the knee in 2000 and did well until now.  She has pain more at night.  She cannot get comfortable.  She has used ice, heat, rubs, Tylenol, Advil with no help.  [facility] was [facility] for this.  I have reviewed the notes.   Review of Systems  Constitutional: Positive for activity change.  Respiratory: Positive for shortness of breath.   Musculoskeletal: Positive for arthralgias, gait problem and joint swelling.  Allergic/Immunologic: Positive for environmental allergies.  All other systems reviewed and are negative.  For Review of Systems, all other systems reviewed and are negative.  The following is a summary of the past history medically, past history surgically, known current medicines, social history and family history.  This information is gathered electronically by the computer from prior information and documentation.  I review this each visit and have found including this information at this point in the chart is beneficial and informative.   Past Medical History:  Diagnosis Date  . Allergy   . Asthma   . Diabetes mellitus without complication ([facility])   . Hyperlipidemia   . OSA on CPAP     Past Surgical History:  Procedure Laterality Date  . CESAREAN SECTION    . CHOLECYSTECTOMY    . EXPLORATION POST OPERATIVE OPEN HEART    . KNEE ARTHROSCOPY AND ARTHROTOMY Left   . TUBAL LIGATION      Current Outpatient Medications on File Prior to Visit  Medication Sig Dispense Refill  . aspirin EC 81 MG tablet Take 81 mg by mouth daily.    . cetirizine (ZYRTEC) 10 MG tablet Take 10 mg by mouth daily.    . clobetasol ointment (TEMOVATE) 0.05 % Apply 1 application topically 2 (two) times daily  as needed.    . ezetimibe (ZETIA) 10 MG tablet Take 1 tablet (10 mg total) by mouth daily. 90 tablet 2  . fluticasone (FLOVENT HFA) 110 MCG/ACT inhaler Inhale into the lungs as needed.    Marland Kitchen glucose blood test strip Use as instructed 100 each 12  . levothyroxine (SYNTHROID) 25 MCG tablet Take 2 tablets (50 mcg total) by mouth daily before breakfast. 180 tablet 0  . losartan (COZAAR) 100 MG tablet Take 1 tablet (100 mg total) by mouth daily. 90 tablet 2  . metFORMIN (GLUCOPHAGE) 500 MG tablet TAKE 2 TABLETS BY MOUTH EVERY DAY WITH BREAKFAST 180 tablet 0  . nystatin cream (MYCOSTATIN) Apply 1 application topically as needed.     Marland Kitchen omeprazole (PRILOSEC) 40 MG capsule TAKE 1 CAPSULE (40 MG TOTAL) BY MOUTH DAILY. 90 capsule 1  . Semaglutide, 1 MG/DOSE, (OZEMPIC, 1 MG/DOSE,) 2 MG/1.5ML SOPN Inject 1 mg into the skin once a week. 18 mL 1  . triamcinolone cream (KENALOG) 0.1 % Apply 1 application topically 2 (two) times daily as needed.    . Evolocumab (REPATHA SURECLICK) 140 MG/ML SOAJ Inject 140 mg into the skin every 14 (fourteen) days. (Patient not taking: Reported on 05/01/2020) 2 mL 3   No current facility-administered medications on file prior to visit.    Social History   Socioeconomic History  . Marital status: Married    Spouse  name: Not on file  . Number of children: 1  . Years of education: Not on file  . Highest education level: Some college, no degree  Occupational History  . Occupation: Public relations account executive    Comment: [employer]  Tobacco Use  . Smoking status: Former Smoker    Types: Cigarettes    Quit date: 06/20/2012    Years since quitting: 7.8  . Smokeless tobacco: Never Used  Vaping Use  . Vaping Use: Never used  Substance and Sexual Activity  . Alcohol use: Yes    Alcohol/week: 1.0 standard drink    Types: 1 Glasses of wine per week    Comment: occasional  . Drug use: Not Currently  . Sexual activity: Yes    Birth control/protection: Surgical    Comment: tubal   Other Topics Concern  . Not on file  Social History Narrative   Lives with husband of 37 years    1 daughter -granddaughter and grandson      Two cats-outside      Enjoys: crafts, Web designer, redoing home       Diet: eat all food groups, avoids milk -little intolerance   Caffeine: 1 cup coffee, unsweet iced tea   Water: 6 cups daily       Wears seat belt   Smoke detectors at home    Does not use phone while driving    Social Determinants of Corporate investment banker Strain: Not on file  Food Insecurity: Not on file  Transportation Needs: Not on file  Physical Activity: Not on file  Stress: Not on file  Social Connections: Not on file  Intimate Partner Violence: Not on file    Family History  Problem Relation Age of Onset  . Cancer Mother   . Cancer Father   . COPD Brother   . Diabetes Sister     BP (!) 162/81   Pulse 70   Ht 5\' 3"  (1.6 m)   Wt 155 lb 2 oz (70.4 kg)   BMI 27.48 kg/m   Body mass index is 27.48 kg/m.      Objective:   Physical Exam Vitals and nursing note reviewed. Exam conducted with a chaperone present.  Constitutional:      Appearance: She is well-developed.  HENT:     Head: Normocephalic and atraumatic.  Eyes:     Conjunctiva/sclera: Conjunctivae normal.     Pupils: Pupils are equal, round, and reactive to light.  Cardiovascular:     Rate and Rhythm: Normal rate and regular rhythm.  Pulmonary:     Effort: Pulmonary effort is normal.  Abdominal:     Palpations: Abdomen is soft.  Musculoskeletal:     Cervical back: Normal range of motion and neck supple.       Legs:  Skin:    General: Skin is warm and dry.  Neurological:     Mental Status: She is alert and oriented to person, place, and time.     Cranial Nerves: No cranial nerve deficit.     Motor: No abnormal muscle tone.     Coordination: Coordination normal.     Deep Tendon Reflexes: Reflexes are normal and symmetric. Reflexes normal.  Psychiatric:        Behavior: Behavior  normal.        Thought Content: Thought content normal.        Judgment: Judgment normal.   X-rays were done of the left knee, reported separately.  Assessment & Plan:   Encounter Diagnosis  Name Primary?  . Chronic pain of left knee Yes   I am concerned about meniscus tear.  PROCEDURE NOTE:  The patient request injection, verbal consent was obtained.  The left knee was prepped appropriately after time out was performed.   Sterile technique was observed and anesthesia was provided by ethyl chloride and a 20-gauge needle was used to inject the knee area.  A 16-gauge needle was then used to aspirate the knee.  Color of fluid aspirated was straw  Total cc's aspirated was 35 cc.    Injection of 1 cc of Celestone 6 mg with several cc's of plain xylocaine was then performed.  A band aid dressing was applied.  The patient was advised to apply ice later today and tomorrow to the injection sight as needed.  I have told her to take Aleve twice a day.  She may need MRI.    Return in two weeks.  Call if any problem.  Precautions discussed.   Electronically Signed Darreld Mclean, MD 4/12/20224:04 PM

## 2020-05-01 NOTE — Telephone Encounter (Signed)
I sent her an orthopedics referral. They should call to schedule her. If her pain is that severe, she should go to ED to make sure she doesn't have a blood clot. They can do rapid imaging at the ED.

## 2020-05-08 ENCOUNTER — Ambulatory Visit: Payer: Federal, State, Local not specified - PPO | Admitting: Orthopaedic Surgery

## 2020-05-08 ENCOUNTER — Other Ambulatory Visit: Payer: Self-pay

## 2020-05-08 ENCOUNTER — Encounter: Payer: Self-pay | Admitting: Orthopaedic Surgery

## 2020-05-08 VITALS — BP 159/81 | HR 59 | Ht 63.0 in

## 2020-05-08 DIAGNOSIS — G8929 Other chronic pain: Secondary | ICD-10-CM

## 2020-05-08 DIAGNOSIS — M25562 Pain in left knee: Secondary | ICD-10-CM | POA: Diagnosis not present

## 2020-05-08 DIAGNOSIS — M1712 Unilateral primary osteoarthritis, left knee: Secondary | ICD-10-CM | POA: Diagnosis not present

## 2020-05-08 NOTE — Progress Notes (Signed)
PROCEDURE NOTE:  The patient request injection, verbal consent was obtained.  The left knee was prepped appropriately after time out was performed.   Sterile technique was observed and anesthesia was provided by ethyl chloride and a 20-gauge needle was used to inject the knee area.  A 16-gauge needle was then used to aspirate the knee.  Color of fluid aspirated was straw  Total cc's aspirated was straw.    Injection of 1 cc of Celestone 6 mg with several cc's of plain xylocaine was then performed.  A band aid dressing was applied.  The patient was advised to apply ice later today and tomorrow to the injection sight as needed.  Get MRI of the left knee.  Return in three weeks. Or earlier.  Encounter Diagnosis  Name Primary?  . Chronic pain of left knee Yes   Call if any problem.  Precautions discussed.   Electronically Signed Darreld Mclean, MD 4/19/20229:14 AM

## 2020-05-08 NOTE — Patient Instructions (Signed)
Your MRI does not need insurance approval please go ahead and call to schedule your appointment with [facility] within at least one (1) week.   Central Scheduling [phone]    Out of work until follow up to review MRI

## 2020-05-10 DIAGNOSIS — I10 Essential (primary) hypertension: Secondary | ICD-10-CM | POA: Diagnosis not present

## 2020-05-10 DIAGNOSIS — G4733 Obstructive sleep apnea (adult) (pediatric): Secondary | ICD-10-CM | POA: Diagnosis not present

## 2020-05-15 ENCOUNTER — Ambulatory Visit: Payer: Federal, State, Local not specified - PPO | Admitting: Orthopaedic Surgery

## 2020-05-21 ENCOUNTER — Ambulatory Visit (HOSPITAL_COMMUNITY)
Admission: RE | Admit: 2020-05-21 | Discharge: 2020-05-21 | Disposition: A | Discharge status: Home / Self Care | Payer: Federal, State, Local not specified - PPO | Source: Ambulatory Visit | Attending: Orthopaedic Surgery | Admitting: Orthopaedic Surgery

## 2020-05-21 ENCOUNTER — Other Ambulatory Visit (INDEPENDENT_AMBULATORY_CARE_PROVIDER_SITE_OTHER): Payer: Self-pay

## 2020-05-21 ENCOUNTER — Encounter (INDEPENDENT_AMBULATORY_CARE_PROVIDER_SITE_OTHER): Payer: Self-pay

## 2020-05-21 ENCOUNTER — Telehealth (INDEPENDENT_AMBULATORY_CARE_PROVIDER_SITE_OTHER): Payer: Self-pay

## 2020-05-21 ENCOUNTER — Other Ambulatory Visit: Payer: Self-pay

## 2020-05-21 ENCOUNTER — Ambulatory Visit (HOSPITAL_COMMUNITY): Payer: Federal, State, Local not specified - PPO

## 2020-05-21 ENCOUNTER — Ambulatory Visit (INDEPENDENT_AMBULATORY_CARE_PROVIDER_SITE_OTHER): Payer: Federal, State, Local not specified - PPO | Admitting: Gastroenterology

## 2020-05-21 ENCOUNTER — Encounter (INDEPENDENT_AMBULATORY_CARE_PROVIDER_SITE_OTHER): Payer: Self-pay | Admitting: Gastroenterology

## 2020-05-21 DIAGNOSIS — G8929 Other chronic pain: Secondary | ICD-10-CM | POA: Diagnosis not present

## 2020-05-21 DIAGNOSIS — R1011 Right upper quadrant pain: Secondary | ICD-10-CM | POA: Diagnosis not present

## 2020-05-21 DIAGNOSIS — M25562 Pain in left knee: Secondary | ICD-10-CM | POA: Insufficient documentation

## 2020-05-21 MED ORDER — SUTAB 1479-225-188 MG PO TABS: 1.0000 | ORAL_TABLET | Freq: Once | ORAL | 0 refills | Status: AC

## 2020-05-21 NOTE — Patient Instructions (Addendum)
Schedule EGD and colonoscopy Continue omeprazole 40 mg qday for one more month, then stop it. If symptoms recur, call back to prescribe 20 mg daily indefinitely

## 2020-05-21 NOTE — Telephone Encounter (Signed)
Sophia Robbins, CMA  

## 2020-05-21 NOTE — Progress Notes (Signed)
Katrinka Blazing, M.D. Gastroenterology & Hepatology [facility] [address] [place], [place] [address] Primary Care Physician: Freddy Finner, NP [address] [place] [address]  Referring MD: Heather Roberts, NP  Chief Complaint: Right upper quadrant pain  History of Present Illness: Sophia Robbins is a 60 y.o. female with PMH DM, HTN, OSA, hypothyroidism and asthma, who presents for evaluation of right upper quadrant pain.  Patient reports that for the last 8 weeks she has presented episodes of RUQ pain, that did not radiate anywhere else, described as a mild stabbing pain.  The pain was not related to any type of food or triggered by anything especifically. It was constant for 4 weeks, present every single day.  Patient reported she was started on omeprazole 40 mg by her PCP, which she has been taking 30 minutes after having breakfast. She reports that the pain significantly improved after starting this medication 4 weeks ago.  Last time she had some pain was 2 weeks ago and it was very mild in nature.  She has also avoided eating spicy food as she is concerned these may trigger her pain.  The patient denies having any nausea, vomiting, fever,  hematochezia, melena, hematemesis, abdominal distention,  diarrhea, jaundice, pruritus. Has slowly lost some weight as she has intentionally made dietary changes.  As part of the evaluation of her abdominal pain, the patient underwent an abdominal ultrasound on 02/21/2020 which showed a 3 mm polyp in the gallbladder fundus but no other pathology.  Most recent labs were performed on 04/11/2020 which showed CMP with potassium of 5.1 with rest of electrolytes, renal function and liver function test within normal limits.  CBC was also normal with hemoglobin of 13.9, although cell count of 6.2 and platelets of 32.  Hepatitis C virus antibody was negative.  TSH was normal at 1.6.  Hemoglobin A1c was 5.7.    Last HCW:CBJSE Last Colonoscopy: never, had negative Cologuard 3 years ago  FHx: neg for any gastrointestinal/liver disease, mother lung cancer, father esophageal cancer Social: neg smoking or illicit drug use. Drinks one wine with dinner. Surgical: appendectomy, c-section  Past Medical History: Past Medical History:  Diagnosis Date  . Allergy   . Asthma   . Diabetes mellitus without complication (HCC)   . Hyperlipidemia   . OSA on CPAP     Past Surgical History: Past Surgical History:  Procedure Laterality Date  . CESAREAN SECTION    . CHOLECYSTECTOMY    . EXPLORATION POST OPERATIVE OPEN HEART    . KNEE ARTHROSCOPY AND ARTHROTOMY Left   . TUBAL LIGATION      Family History: Family History  Problem Relation Age of Onset  . Cancer Mother   . Cancer Father   . COPD Brother   . Diabetes Sister     Social History: Social History   Tobacco Use  Smoking Status Former Smoker  . Types: Cigarettes  . Quit date: 06/20/2012  . Years since quitting: 7.9  Smokeless Tobacco Never Used   Social History   Substance and Sexual Activity  Alcohol Use Yes  . Alcohol/week: 1.0 standard drink  . Types: 1 Glasses of wine per week   Comment: occasional   Social History   Substance and Sexual Activity  Drug Use Not Currently    Allergies: Allergies  Allergen Reactions  . Other Itching, Rash, Shortness Of Breath and Swelling  . Codeine Hives  . Shellfish Allergy   .  Statins     Medications: Current Outpatient Medications  Medication Sig Dispense Refill  . aspirin EC 81 MG tablet Take 81 mg by mouth daily.    . cetirizine (ZYRTEC) 10 MG tablet Take 10 mg by mouth daily.    . clobetasol ointment (TEMOVATE) 0.05 % Apply 1 application topically 2 (two) times daily as needed.    . ezetimibe (ZETIA) 10 MG tablet Take 1 tablet (10 mg total) by mouth daily. 90 tablet 2  . fluticasone (FLOVENT HFA) 110 MCG/ACT inhaler Inhale into the lungs as needed.    Marland Kitchen glucose blood test  strip Use as instructed 100 each 12  . ibuprofen (ADVIL) 200 MG tablet Take 400 mg by mouth. Every 12 hours prn.    . levothyroxine (SYNTHROID) 25 MCG tablet Take 2 tablets (50 mcg total) by mouth daily before breakfast. 180 tablet 0  . losartan (COZAAR) 100 MG tablet Take 1 tablet (100 mg total) by mouth daily. 90 tablet 2  . metFORMIN (GLUCOPHAGE) 500 MG tablet TAKE 2 TABLETS BY MOUTH EVERY DAY WITH BREAKFAST 180 tablet 0  . nystatin cream (MYCOSTATIN) Apply 1 application topically as needed.     . Semaglutide, 1 MG/DOSE, (OZEMPIC, 1 MG/DOSE,) 2 MG/1.5ML SOPN Inject 1 mg into the skin once a week. 18 mL 1  . triamcinolone cream (KENALOG) 0.1 % Apply 1 application topically 2 (two) times daily as needed.    Marland Kitchen omeprazole (PRILOSEC) 40 MG capsule TAKE 1 CAPSULE (40 MG TOTAL) BY MOUTH DAILY. 90 capsule 1   No current facility-administered medications for this visit.    Review of Systems: GENERAL: negative for malaise, night sweats HEENT: No changes in hearing or vision, no nose bleeds or other nasal problems. NECK: Negative for lumps, goiter, pain and significant neck swelling RESPIRATORY: Negative for cough, wheezing CARDIOVASCULAR: Negative for chest pain, leg swelling, palpitations, orthopnea GI: SEE HPI MUSCULOSKELETAL: Negative for joint pain or swelling, back pain, and muscle pain. SKIN: Negative for lesions, rash PSYCH: Negative for sleep disturbance, mood disorder and recent psychosocial stressors. HEMATOLOGY Negative for prolonged bleeding, bruising easily, and swollen nodes. ENDOCRINE: Negative for cold or heat intolerance, polyuria, polydipsia and goiter. NEURO: negative for tremor, gait imbalance, syncope and seizures. The remainder of the review of systems is noncontributory.   Physical Exam: BP (!) 159/87 (BP Location: Left Arm, Patient Position: Sitting, Cuff Size: Large)   Pulse 69   Temp 98 F (36.7 C) (Oral)   Ht 5\' 3"  (1.6 m)   Wt 157 lb (71.2 kg)   BMI 27.81  kg/m  GENERAL: The patient is AO x3, in no acute distress. HEENT: Head is normocephalic and atraumatic. EOMI are intact. Mouth is well hydrated and without lesions. NECK: Supple. No masses LUNGS: Clear to auscultation. No presence of rhonchi/wheezing/rales. Adequate chest expansion HEART: RRR, normal s1 and s2. ABDOMEN: Soft, nontender, no guarding, no peritoneal signs, and nondistended. BS +. No masses. EXTREMITIES: Without any cyanosis, clubbing, rash, lesions or edema. NEUROLOGIC: AOx3, no focal motor deficit. SKIN: no jaundice, no rashes   Imaging/Labs: as above  I personally reviewed and interpreted the available labs, imaging and endoscopic files.  Impression and Plan: Kista Robb is a 60 y.o. female with PMH DM, HTN, OSA, hypothyroidism and asthma, who presents for evaluation of right upper quadrant pain.  The patient has presented episodes of abdominal pain of unclear etiology to have significantly improved with the use of PPI.  She has not presented any other red flag signs  but given her age it would be important to rule out an organic condition with an EGD.  I advised her to continue taking the omeprazole every day for 1 more month and completed a 28-month course and stop it afterwards.  If the symptoms recur, will need to refill his medication 20 mg dosing for management of possible functional dyspepsia.  The patient understood and agreed.  Finally, we will schedule a colonoscopy for colorectal cancer screening as she has never had one in the past and her last Cologuard was 3 years ago.  -Schedule EGD and colonoscopy - Continue omeprazole 40 mg qday for one more month, then stop it. If symptoms recur, patient will need to call back to prescribe 20 mg daily indefinitely   All questions were answered.      Katrinka Blazing, MD Gastroenterology and Hepatology [facility]

## 2020-05-24 ENCOUNTER — Encounter: Payer: Self-pay | Admitting: Orthopaedic Surgery

## 2020-05-24 ENCOUNTER — Other Ambulatory Visit: Payer: Self-pay

## 2020-05-24 ENCOUNTER — Ambulatory Visit: Payer: Federal, State, Local not specified - PPO | Admitting: Orthopaedic Surgery

## 2020-05-24 VITALS — BP 187/82 | HR 69 | Ht 63.0 in | Wt 155.2 lb

## 2020-05-24 DIAGNOSIS — M25562 Pain in left knee: Secondary | ICD-10-CM | POA: Diagnosis not present

## 2020-05-24 DIAGNOSIS — G8929 Other chronic pain: Secondary | ICD-10-CM

## 2020-05-24 NOTE — Progress Notes (Signed)
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 DDU:[id number]  Chief Complaint  Patient presents with  . Knee Pain    L/ here to go over MRI/ still hurting    HPI  Sophia Robbins is a 60 y.o. female who has left knee pain and swelling.  She had MRI which showed:  Negative for meniscal or ligament tear.  No bony abnormality.  Edema in popliteal fat may be due to dependent change or could reflect soft tissue contusion.  I have explained the findings to her.  I will give her a brace.  She can come off the crutches.  I will her her stay out of work.  Gradually increase activity.   Body mass index is 27.5 kg/m.  ROS  Review of Systems  Constitutional: Positive for activity change.  Respiratory: Positive for shortness of breath.   Musculoskeletal: Positive for arthralgias, gait problem and joint swelling.  Allergic/Immunologic: Positive for environmental allergies.  All other systems reviewed and are negative.   All other systems reviewed and are negative.  The following is a summary of the past history medically, past history surgically, known current medicines, social history and family history.  This information is gathered electronically by the computer from prior information and documentation.  I review this each visit and have found including this information at this point in the chart is beneficial and informative.    Past Medical History:  Diagnosis Date  . Allergy   . Asthma   . Diabetes mellitus without complication (HCC)   . Hyperlipidemia   . OSA on CPAP     Past Surgical History:  Procedure Laterality Date  . CESAREAN SECTION    . CHOLECYSTECTOMY    . EXPLORATION POST OPERATIVE OPEN HEART    . KNEE ARTHROSCOPY AND ARTHROTOMY Left   . TUBAL LIGATION      Family History  Problem Relation Age of Onset  . Cancer Mother   . Cancer Father   . COPD Brother   . Diabetes Sister     Social History Social History   Tobacco Use  . Smoking status:  Former Smoker    Types: Cigarettes    Quit date: 06/20/2012    Years since quitting: 7.9  . Smokeless tobacco: Never Used  Vaping Use  . Vaping Use: Never used  Substance Use Topics  . Alcohol use: Yes    Alcohol/week: 1.0 standard drink    Types: 1 Glasses of wine per week    Comment: occasional  . Drug use: Not Currently    Allergies  Allergen Reactions  . Other Itching, Rash, Shortness Of Breath and Swelling  . Codeine Hives  . Shellfish Allergy   . Statins     Current Outpatient Medications  Medication Sig Dispense Refill  . aspirin EC 81 MG tablet Take 81 mg by mouth daily.    . cetirizine (ZYRTEC) 10 MG tablet Take 10 mg by mouth daily.    . clobetasol ointment (TEMOVATE) 0.05 % Apply 1 application topically 2 (two) times daily as needed.    . ezetimibe (ZETIA) 10 MG tablet Take 1 tablet (10 mg total) by mouth daily. 90 tablet 2  . fluticasone (FLOVENT HFA) 110 MCG/ACT inhaler Inhale into the lungs as needed.    Marland Kitchen glucose blood test strip Use as instructed 100 each 12  . ibuprofen (ADVIL) 200 MG tablet Take 400 mg by mouth. Every 12 hours prn.    . levothyroxine (SYNTHROID) 25 MCG tablet Take 2 tablets (  50 mcg total) by mouth daily before breakfast. 180 tablet 0  . losartan (COZAAR) 100 MG tablet Take 1 tablet (100 mg total) by mouth daily. 90 tablet 2  . metFORMIN (GLUCOPHAGE) 500 MG tablet TAKE 2 TABLETS BY MOUTH EVERY DAY WITH BREAKFAST 180 tablet 0  . nystatin cream (MYCOSTATIN) Apply 1 application topically as needed.     Marland Kitchen omeprazole (PRILOSEC) 40 MG capsule TAKE 1 CAPSULE (40 MG TOTAL) BY MOUTH DAILY. 90 capsule 1  . Semaglutide, 1 MG/DOSE, (OZEMPIC, 1 MG/DOSE,) 2 MG/1.5ML SOPN Inject 1 mg into the skin once a week. 18 mL 1  . triamcinolone cream (KENALOG) 0.1 % Apply 1 application topically 2 (two) times daily as needed.     No current facility-administered medications for this visit.     Physical Exam  Blood pressure (!) 187/82, pulse 69, height 5\' 3"  (1.6  m), weight 155 lb 4 oz (70.4 kg).  Constitutional: overall normal hygiene, normal nutrition, well developed, normal grooming, normal body habitus. Assistive device:crutches  Musculoskeletal: gait and station Limp left, muscle tone and strength are normal, no tremors or atrophy is present.  .  Neurological: coordination overall normal.  Deep tendon reflex/nerve stretch intact.  Sensation normal.  Cranial nerves II-XII intact.   Skin:   Normal overall no scars, lesions, ulcers or rashes. No psoriasis.  Psychiatric: Alert and oriented x 3.  Recent memory intact, remote memory unclear.  Normal mood and affect. Well groomed.  Good eye contact.  Cardiovascular: overall no swelling, no varicosities, no edema bilaterally, normal temperatures of the legs and arms, no clubbing, cyanosis and good capillary refill.  Left knee has effusion, slight crepitus, medial joint line pain, ROM 0 to 110, limp left.  NV intact.  Lymphatic: palpation is normal.  All other systems reviewed and are negative   The patient has been educated about the nature of the problem(s) and counseled on treatment options.  The patient appeared to understand what I have discussed and is in agreement with it.  Encounter Diagnosis  Name Primary?  . Chronic pain of left knee Yes    PLAN Call if any problems.  Precautions discussed.  Continue current medications.   Return to clinic 3 weeks   Electronically Signed , MD 5/5/20228:38 AM

## 2020-05-24 NOTE — Patient Instructions (Signed)
Out of work 

## 2020-06-14 ENCOUNTER — Other Ambulatory Visit: Payer: Self-pay

## 2020-06-14 ENCOUNTER — Encounter: Payer: Self-pay | Admitting: Orthopaedic Surgery

## 2020-06-14 ENCOUNTER — Ambulatory Visit: Payer: Federal, State, Local not specified - PPO | Admitting: Orthopaedic Surgery

## 2020-06-14 VITALS — BP 152/86 | HR 72 | Ht 63.0 in | Wt 151.8 lb

## 2020-06-14 DIAGNOSIS — M25562 Pain in left knee: Secondary | ICD-10-CM | POA: Diagnosis not present

## 2020-06-14 DIAGNOSIS — G8929 Other chronic pain: Secondary | ICD-10-CM

## 2020-06-14 NOTE — Progress Notes (Signed)
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 TIW:[id number]  Chief Complaint  Patient presents with  . Knee Pain    LEFT-seems weak, would like some strengthening exercises    HPI  Sophia Robbins is a 60 y.o. female who has left knee pain.  She is slightly better.  She has been using her brace.  She has pain after sitting a while or walking a while.  MRI was negative.  I have gone over exercises for her to do and given sheet.  She will begin walking program also.  Stay out of work.  I will see in three weeks.  Use brace as needed.   Body mass index is 26.89 kg/m.  ROS  Review of Systems  Constitutional: Positive for activity change.  Respiratory: Positive for shortness of breath.   Musculoskeletal: Positive for arthralgias, gait problem and joint swelling.  Allergic/Immunologic: Positive for environmental allergies.  All other systems reviewed and are negative.   All other systems reviewed and are negative.  The following is a summary of the past history medically, past history surgically, known current medicines, social history and family history.  This information is gathered electronically by the computer from prior information and documentation.  I review this each visit and have found including this information at this point in the chart is beneficial and informative.    Past Medical History:  Diagnosis Date  . Allergy   . Asthma   . Diabetes mellitus without complication (HCC)   . Hyperlipidemia   . OSA on CPAP     Past Surgical History:  Procedure Laterality Date  . CESAREAN SECTION    . CHOLECYSTECTOMY    . EXPLORATION POST OPERATIVE OPEN HEART    . KNEE ARTHROSCOPY AND ARTHROTOMY Left   . TUBAL LIGATION      Family History  Problem Relation Age of Onset  . Cancer Mother   . Cancer Father   . COPD Brother   . Diabetes Sister     Social History Social History   Tobacco Use  . Smoking status: Former Smoker    Types: Cigarettes     Quit date: 06/20/2012    Years since quitting: 7.9  . Smokeless tobacco: Never Used  Vaping Use  . Vaping Use: Never used  Substance Use Topics  . Alcohol use: Yes    Alcohol/week: 1.0 standard drink    Types: 1 Glasses of wine per week    Comment: occasional  . Drug use: Not Currently    Allergies  Allergen Reactions  . Other Itching, Rash, Shortness Of Breath and Swelling  . Codeine Hives  . Shellfish Allergy   . Statins     Current Outpatient Medications  Medication Sig Dispense Refill  . aspirin EC 81 MG tablet Take 81 mg by mouth daily.    . cetirizine (ZYRTEC) 10 MG tablet Take 10 mg by mouth daily.    . clobetasol ointment (TEMOVATE) 0.05 % Apply 1 application topically 2 (two) times daily as needed.    . ezetimibe (ZETIA) 10 MG tablet Take 1 tablet (10 mg total) by mouth daily. 90 tablet 2  . fluticasone (FLOVENT HFA) 110 MCG/ACT inhaler Inhale into the lungs as needed.    Marland Kitchen glucose blood test strip Use as instructed 100 each 12  . ibuprofen (ADVIL) 200 MG tablet Take 400 mg by mouth. Every 12 hours prn.    . levothyroxine (SYNTHROID) 25 MCG tablet Take 2 tablets (50 mcg total) by mouth daily before  breakfast. 180 tablet 0  . losartan (COZAAR) 100 MG tablet Take 1 tablet (100 mg total) by mouth daily. 90 tablet 2  . metFORMIN (GLUCOPHAGE) 500 MG tablet TAKE 2 TABLETS BY MOUTH EVERY DAY WITH BREAKFAST 180 tablet 0  . nystatin cream (MYCOSTATIN) Apply 1 application topically as needed.     Marland Kitchen omeprazole (PRILOSEC) 40 MG capsule TAKE 1 CAPSULE (40 MG TOTAL) BY MOUTH DAILY. 90 capsule 1  . Semaglutide, 1 MG/DOSE, (OZEMPIC, 1 MG/DOSE,) 2 MG/1.5ML SOPN Inject 1 mg into the skin once a week. 18 mL 1  . triamcinolone cream (KENALOG) 0.1 % Apply 1 application topically 2 (two) times daily as needed.     No current facility-administered medications for this visit.     Physical Exam  Blood pressure (!) 152/86, pulse 72, height 5\' 3"  (1.6 m), weight 151 lb 12.8 oz (68.9  kg).  Constitutional: overall normal hygiene, normal nutrition, well developed, normal grooming, normal body habitus. Assistive device:left knee brace  Musculoskeletal: gait and station Limp left slightly, muscle tone and strength are normal, no tremors or atrophy is present.  .  Neurological: coordination overall normal.  Deep tendon reflex/nerve stretch intact.  Sensation normal.  Cranial nerves II-XII intact.   Skin:   Normal overall no scars, lesions, ulcers or rashes. No psoriasis.  Psychiatric: Alert and oriented x 3.  Recent memory intact, remote memory unclear.  Normal mood and affect. Well groomed.  Good eye contact.  Cardiovascular: overall no swelling, no varicosities, no edema bilaterally, normal temperatures of the legs and arms, no clubbing, cyanosis and good capillary refill.  Lymphatic: palpation is normal.  Left knee with some tenderness, slight effusion, slight crepitus, ROM 0 to 110, slight limp left, stable.  All other systems reviewed and are negative   The patient has been educated about the nature of the problem(s) and counseled on treatment options.  The patient appeared to understand what I have discussed and is in agreement with it.  Encounter Diagnosis  Name Primary?  . Chronic pain of left knee Yes    PLAN Call if any problems.  Precautions discussed.  Continue current medications.   Return to clinic 3 weeks   Out of work.  Electronically Signed , MD 5/26/20228:48 AM

## 2020-06-14 NOTE — Patient Instructions (Signed)
Out of work note

## 2020-06-25 ENCOUNTER — Other Ambulatory Visit (HOSPITAL_COMMUNITY): Admission: RE | Admit: 2020-06-25 | Payer: Federal, State, Local not specified - PPO | Source: Ambulatory Visit

## 2020-06-26 ENCOUNTER — Ambulatory Visit (HOSPITAL_COMMUNITY): Payer: Federal, State, Local not specified - PPO | Admitting: Anesthesiology

## 2020-06-26 ENCOUNTER — Encounter (HOSPITAL_COMMUNITY): Payer: Self-pay | Admitting: Gastroenterology

## 2020-06-26 ENCOUNTER — Other Ambulatory Visit: Payer: Self-pay

## 2020-06-26 ENCOUNTER — Ambulatory Visit (HOSPITAL_COMMUNITY)
Admission: RE | Admit: 2020-06-26 | Discharge: 2020-06-26 | Disposition: A | Discharge status: Home / Self Care | Payer: Federal, State, Local not specified - PPO | Attending: Gastroenterology | Admitting: Gastroenterology

## 2020-06-26 ENCOUNTER — Encounter (HOSPITAL_COMMUNITY)
Admission: RE | Disposition: A | Discharge status: Home / Self Care | Payer: Self-pay | Source: Home / Self Care | Attending: Gastroenterology

## 2020-06-26 DIAGNOSIS — E119 Type 2 diabetes mellitus without complications: Secondary | ICD-10-CM | POA: Insufficient documentation

## 2020-06-26 DIAGNOSIS — R1011 Right upper quadrant pain: Secondary | ICD-10-CM | POA: Insufficient documentation

## 2020-06-26 DIAGNOSIS — K3189 Other diseases of stomach and duodenum: Secondary | ICD-10-CM | POA: Diagnosis not present

## 2020-06-26 DIAGNOSIS — Z87891 Personal history of nicotine dependence: Secondary | ICD-10-CM | POA: Insufficient documentation

## 2020-06-26 DIAGNOSIS — Z1211 Encounter for screening for malignant neoplasm of colon: Secondary | ICD-10-CM | POA: Diagnosis not present

## 2020-06-26 DIAGNOSIS — Z888 Allergy status to other drugs, medicaments and biological substances status: Secondary | ICD-10-CM | POA: Insufficient documentation

## 2020-06-26 DIAGNOSIS — Z7951 Long term (current) use of inhaled steroids: Secondary | ICD-10-CM | POA: Diagnosis not present

## 2020-06-26 DIAGNOSIS — K319 Disease of stomach and duodenum, unspecified: Secondary | ICD-10-CM | POA: Diagnosis not present

## 2020-06-26 DIAGNOSIS — Z7984 Long term (current) use of oral hypoglycemic drugs: Secondary | ICD-10-CM | POA: Insufficient documentation

## 2020-06-26 DIAGNOSIS — J45909 Unspecified asthma, uncomplicated: Secondary | ICD-10-CM | POA: Insufficient documentation

## 2020-06-26 DIAGNOSIS — G4733 Obstructive sleep apnea (adult) (pediatric): Secondary | ICD-10-CM | POA: Diagnosis not present

## 2020-06-26 DIAGNOSIS — Z885 Allergy status to narcotic agent status: Secondary | ICD-10-CM | POA: Insufficient documentation

## 2020-06-26 DIAGNOSIS — I10 Essential (primary) hypertension: Secondary | ICD-10-CM | POA: Insufficient documentation

## 2020-06-26 DIAGNOSIS — E039 Hypothyroidism, unspecified: Secondary | ICD-10-CM | POA: Insufficient documentation

## 2020-06-26 DIAGNOSIS — Z79899 Other long term (current) drug therapy: Secondary | ICD-10-CM | POA: Insufficient documentation

## 2020-06-26 DIAGNOSIS — Z7982 Long term (current) use of aspirin: Secondary | ICD-10-CM | POA: Insufficient documentation

## 2020-06-26 DIAGNOSIS — Z79890 Hormone replacement therapy (postmenopausal): Secondary | ICD-10-CM | POA: Insufficient documentation

## 2020-06-26 DIAGNOSIS — Z91013 Allergy to seafood: Secondary | ICD-10-CM | POA: Insufficient documentation

## 2020-06-26 HISTORY — PX: BIOPSY: SHX5522

## 2020-06-26 HISTORY — PX: COLONOSCOPY WITH PROPOFOL: SHX5780

## 2020-06-26 HISTORY — PX: ESOPHAGOGASTRODUODENOSCOPY (EGD) WITH PROPOFOL: SHX5813

## 2020-06-26 LAB — GLUCOSE, CAPILLARY
Glucose-Capillary: 126 mg/dL — ABNORMAL HIGH (ref 70–99)
Glucose-Capillary: 67 mg/dL — ABNORMAL LOW (ref 70–99)
Glucose-Capillary: 76 mg/dL (ref 70–99)

## 2020-06-26 SURGERY — COLONOSCOPY WITH PROPOFOL
Anesthesia: General

## 2020-06-26 MED ORDER — PROPOFOL 10 MG/ML IV BOLUS
INTRAVENOUS | Status: DC | PRN
  Administered 2020-06-26: 100 mg via INTRAVENOUS

## 2020-06-26 MED ORDER — DEXTROSE 50 % IV SOLN
INTRAVENOUS | Status: AC
  Filled 2020-06-26: qty 50

## 2020-06-26 MED ORDER — PROPOFOL 500 MG/50ML IV EMUL
INTRAVENOUS | Status: DC | PRN
  Administered 2020-06-26: 150 ug/kg/min via INTRAVENOUS

## 2020-06-26 MED ORDER — DEXTROSE 50 % IV SOLN
25.0000 mL | Freq: Once | INTRAVENOUS | Status: AC
  Administered 2020-06-26: 25 mL via INTRAVENOUS

## 2020-06-26 MED ORDER — LIDOCAINE HCL (CARDIAC) PF 100 MG/5ML IV SOSY
PREFILLED_SYRINGE | INTRAVENOUS | Status: DC | PRN
  Administered 2020-06-26: 60 mg via INTRAVENOUS

## 2020-06-26 MED ORDER — LACTATED RINGERS IV SOLN: INTRAVENOUS | Status: DC

## 2020-06-26 NOTE — Transfer of Care (Signed)
Immediate Anesthesia Transfer of Care Note  Patient: Sophia Robbins  Procedure(s) Performed: COLONOSCOPY WITH PROPOFOL (N/A ) ESOPHAGOGASTRODUODENOSCOPY (EGD) WITH PROPOFOL (N/A ) BIOPSY  Patient Location: PACU  Anesthesia Type:General  Level of Consciousness: awake, alert , oriented and patient cooperative  Airway & Oxygen Therapy: Patient Spontanous Breathing  Post-op Assessment: Report given to RN, Post -op Vital signs reviewed and stable and Patient moving all extremities X 4  Post vital signs: Reviewed and stable  Last Vitals:  Vitals Value Taken Time  BP    Temp    Pulse    Resp    SpO2      Last Pain:  Vitals:   06/26/20 0855  TempSrc:   PainSc: 3       Patients Stated Pain Goal: 10 (06/26/20 0725)  Complications: No complications documented.

## 2020-06-26 NOTE — Op Note (Signed)
[facility] Patient Name: Sophia Robbins Procedure Date: 06/26/2020 9:09 AM MRN: [id number] Date of Birth: 1960/05/26 Attending MD: Katrinka Blazing ,  CSN: [id number] Age: 60 Admit Type: Outpatient Procedure:                Colonoscopy Indications:              Screening for colorectal malignant neoplasm Providers:                Katrinka Blazing, Angelica Ran, Kristine L. Jessee Avers, Technician Referring MD:              Medicines:                Monitored Anesthesia Care Complications:            No immediate complications. Estimated Blood Loss:     Estimated blood loss: none. Procedure:                Pre-Anesthesia Assessment:                           - Prior to the procedure, a History and Physical                            was performed, and patient medications, allergies                            and sensitivities were reviewed. The patient's                            tolerance of previous anesthesia was reviewed.                           - The risks and benefits of the procedure and the                            sedation options and risks were discussed with the                            patient. All questions were answered and informed                            consent was obtained.                           - ASA Grade Assessment: II - A patient with mild                            systemic disease.                           After obtaining informed consent, the colonoscope                            was passed under direct vision. Throughout the  procedure, the patient's blood pressure, pulse, and                            oxygen saturations were monitored continuously. The                            [id number]-[id number] ([id number]) scope was introduced through                            the anus and advanced to the the cecum, identified                            by appendiceal orifice and ileocecal valve. The                             colonoscopy was performed without difficulty. The                            patient tolerated the procedure well. The quality                            of the bowel preparation was adequate. Scope In: 9:13:14 AM Scope Out: 9:38:19 AM Scope Withdrawal Time: 0 hours 15 minutes 39 seconds  Total Procedure Duration: 0 hours 25 minutes 5 seconds  Findings:      The perianal and digital rectal examinations were normal.      The colon (entire examined portion) appeared normal.      The retroflexed view of the distal rectum and anal verge was normal and       showed no anal or rectal abnormalities. Impression:               - The entire examined colon is normal.                           - The distal rectum and anal verge are normal on                            retroflexion view.                           - No specimens collected. Moderate Sedation:      Per Anesthesia Care Recommendation:           - Discharge patient to home (ambulatory).                           - Resume previous diet.                           - Repeat colonoscopy in 10 years for screening                            purposes. Procedure Code(s):        --- Professional ---  J1884, Colorectal cancer screening; colonoscopy on                            individual not meeting criteria for high risk Diagnosis Code(s):        --- Professional ---                           Z12.11, Encounter for screening for malignant                            neoplasm of colon CPT copyright 2019 [employer]. All rights reserved. The codes documented in this report are preliminary and upon coder review may  be revised to meet current compliance requirements. Katrinka Blazing, MD Katrinka Blazing,  06/26/2020 9:41:27 AM This report has been signed electronically. Number of Addenda: 0

## 2020-06-26 NOTE — Anesthesia Postprocedure Evaluation (Signed)
Anesthesia Post Note  Patient: Sophia Robbins  Procedure(s) Performed: COLONOSCOPY WITH PROPOFOL (N/A ) ESOPHAGOGASTRODUODENOSCOPY (EGD) WITH PROPOFOL (N/A ) BIOPSY  Patient location during evaluation: Phase II Anesthesia Type: General Level of consciousness: awake, awake and alert and patient cooperative Pain management: satisfactory to patient Vital Signs Assessment: post-procedure vital signs reviewed and stable Respiratory status: spontaneous breathing Cardiovascular status: stable Postop Assessment: no apparent nausea or vomiting and no headache Anesthetic complications: no   No complications documented.   Last Vitals:  Vitals:   06/26/20 0725 06/26/20 0942  BP: (!) 155/90 (!) 158/72  Pulse:  71  Resp: (!) 22 16  Temp: 36.5 C 36.4 C  SpO2: 97% 98%    Last Pain:  Vitals:   06/26/20 0942  TempSrc: Oral  PainSc: 0-No pain                 Ajiah Mcglinn

## 2020-06-26 NOTE — Anesthesia Preprocedure Evaluation (Addendum)
Anesthesia Evaluation  Patient identified by MRN, date of birth, ID band Patient awake    Reviewed: Allergy & Precautions, NPO status , Patient's Chart, lab work & pertinent test results  History of Anesthesia Complications Negative for: history of anesthetic complications  Airway Mallampati: II  TM Distance: >3 FB Neck ROM: Full    Dental  (+) Dental Advisory Given Crowns  :   Pulmonary asthma , sleep apnea and Continuous Positive Airway Pressure Ventilation , former smoker,    Pulmonary exam normal breath sounds clear to auscultation       Cardiovascular Exercise Tolerance: Good hypertension, Pt. on medications Normal cardiovascular exam Rhythm:Regular Rate:Normal     Neuro/Psych    GI/Hepatic negative GI ROS, Neg liver ROS,   Endo/Other  diabetes, Well Controlled, Type 2, Oral Hypoglycemic AgentsHypothyroidism   Renal/GU negative Renal ROS     Musculoskeletal   Abdominal   Peds  Hematology   Anesthesia Other Findings   Reproductive/Obstetrics                            Anesthesia Physical Anesthesia Plan  ASA: II  Anesthesia Plan: General   Post-op Pain Management:    Induction: Intravenous  PONV Risk Score and Plan: Propofol infusion  Airway Management Planned: Nasal Cannula and Natural Airway  Additional Equipment:   Intra-op Plan:   Post-operative Plan:   Informed Consent: I have reviewed the patients History and Physical, chart, labs and discussed the procedure including the risks, benefits and alternatives for the proposed anesthesia with the patient or authorized representative who has indicated his/her understanding and acceptance.     Dental advisory given  Plan Discussed with: CRNA and Surgeon  Anesthesia Plan Comments:         Anesthesia Quick Evaluation

## 2020-06-26 NOTE — Op Note (Signed)
[facility] Patient Name: Sophia Robbins Procedure Date: 06/26/2020 8:53 AM MRN: [id number] Date of Birth: [birth year] Attending MD: Katrinka Blazing ,  CSN: [id number] Age: 60 Admit Type: Outpatient Procedure:                Upper GI endoscopy Indications:              Abdominal pain in the right upper quadrant Providers:                Katrinka Blazing, Angelica Ran, Kristine L. Jessee Avers, Technician Referring MD:              Medicines:                Monitored Anesthesia Care Complications:            No immediate complications. Estimated Blood Loss:     Estimated blood loss: none. Procedure:                Pre-Anesthesia Assessment:                           - Prior to the procedure, a History and Physical                            was performed, and patient medications, allergies                            and sensitivities were reviewed. The patient's                            tolerance of previous anesthesia was reviewed.                           - The risks and benefits of the procedure and the                            sedation options and risks were discussed with the                            patient. All questions were answered and informed                            consent was obtained.                           - ASA Grade Assessment: II - A patient with mild                            systemic disease.                           After obtaining informed consent, the endoscope was                            passed under direct vision. Throughout  the                            procedure, the patient's blood pressure, pulse, and                            oxygen saturations were monitored continuously. The                            [id number]) was introduced through the mouth,                            and advanced to the second part of duodenum. The                            upper GI endoscopy was accomplished without                             difficulty. The patient tolerated the procedure                            well. Scope In: 8:59:49 AM Scope Out: 9:07:22 AM Total Procedure Duration: 0 hours 7 minutes 33 seconds  Findings:      The examined esophagus was normal.      A few localized small erosions with no stigmata of recent bleeding were       found in the gastric body. Biopsies were taken with a cold forceps for       Helicobacter pylori testing.      The examined duodenum was normal. Biopsies were taken with a cold       forceps for histology. Impression:               - Normal esophagus.                           - Erosive gastropathy with no stigmata of recent                            bleeding. Biopsied.                           - Normal examined duodenum. Biopsied. Moderate Sedation:      Per Anesthesia Care Recommendation:           - Discharge patient to home (ambulatory).                           - Resume previous diet.                           - Await pathology results.                           - Check H. pylori serology                           - No ibuprofen, naproxen, or other non-steroidal  anti-inflammatory drugs. Procedure Code(s):        --- Professional ---                           [phone], Esophagogastroduodenoscopy, flexible,                            transoral; with biopsy, single or multiple Diagnosis Code(s):        --- Professional ---                           K31.89, Other diseases of stomach and duodenum                           R10.11, Right upper quadrant pain CPT copyright 2019 American Medical Association. All rights reserved. The codes documented in this report are preliminary and upon coder review may  be revised to meet current compliance requirements. Katrinka Blazing, MD Katrinka Blazing,  06/26/2020 9:11:54 AM This report has been signed electronically. Number of Addenda: 0

## 2020-06-26 NOTE — H&P (Signed)
Sophia Robbins is an 60 y.o. female.   Chief Complaint: Abdominal pain and colorectal cancer screening HPI: Sophia Robbins is a 60 y.o. female with PMH DM, HTN, OSA, hypothyroidism and asthma, who presents for evaluation of right upper quadrant pain and colorectal cancer screening.  Patient reports that for the last 3 months she has presented intermittent episodes of right upper quadrant pain that have been mild in severity but are described as a stabbing pain that is not triggered by any specific factors.  States that she had some improvement with famotidine in the past but she has not been taking it recently.  Never had a colonoscopy in the past or an EGD.  No family history of colorectal cancer.  Past Medical History:  Diagnosis Date  . Allergy   . Asthma   . Diabetes mellitus without complication (HCC)   . Hyperlipidemia   . OSA on CPAP     Past Surgical History:  Procedure Laterality Date  . APPENDECTOMY    . CESAREAN SECTION    . EXPLORATION POST OPERATIVE OPEN HEART    . KNEE ARTHROSCOPY AND ARTHROTOMY Left   . TUBAL LIGATION      Family History  Problem Relation Age of Onset  . Cancer Mother   . Cancer Father   . COPD Brother   . Diabetes Sister    Social History:  reports that she quit smoking about 8 years ago. Her smoking use included cigarettes. She has never used smokeless tobacco. She reports current alcohol use of about 1.0 standard drink of alcohol per week. She reports previous drug use.  Allergies:  Allergies  Allergen Reactions  . Other Itching, Rash, Shortness Of Breath and Swelling  . Codeine Hives  . Shellfish Allergy Hives  . Statins Hives    Blisters and peeling of skin    Medications Prior to Admission  Medication Sig Dispense Refill  . aspirin EC 81 MG tablet Take 81 mg by mouth daily.    . cetirizine (ZYRTEC) 10 MG tablet Take 10 mg by mouth daily.    . clobetasol ointment (TEMOVATE) 0.05 % Apply 1 application topically 2 (two) times daily as  needed (Skin Rash).    . ezetimibe (ZETIA) 10 MG tablet Take 1 tablet (10 mg total) by mouth daily. 90 tablet 2  . ibuprofen (ADVIL) 200 MG tablet Take 400 mg by mouth 2 (two) times daily.    Marland Kitchen levothyroxine (SYNTHROID) 25 MCG tablet Take 2 tablets (50 mcg total) by mouth daily before breakfast. 180 tablet 0  . losartan (COZAAR) 100 MG tablet Take 1 tablet (100 mg total) by mouth daily. 90 tablet 2  . metFORMIN (GLUCOPHAGE) 500 MG tablet TAKE 2 TABLETS BY MOUTH EVERY DAY WITH BREAKFAST (Patient taking differently: Take 1,000 mg by mouth daily with breakfast.) 180 tablet 0  . omeprazole (PRILOSEC) 40 MG capsule TAKE 1 CAPSULE (40 MG TOTAL) BY MOUTH DAILY. 90 capsule 1  . Semaglutide, 1 MG/DOSE, (OZEMPIC, 1 MG/DOSE,) 2 MG/1.5ML SOPN Inject 1 mg into the skin once a week. 18 mL 1  . triamcinolone cream (KENALOG) 0.1 % Apply 1 application topically 2 (two) times daily as needed (Rash).    . fluticasone (FLOVENT HFA) 110 MCG/ACT inhaler Inhale 2 puffs into the lungs daily as needed (Shortness of breath).    Marland Kitchen glucose blood test strip Use as instructed 100 each 12  . nystatin cream (MYCOSTATIN) Apply 1 application topically daily as needed for dry skin.  No results found for this or any previous visit (from the past 48 hour(s)). No results found.  Review of Systems  Constitutional: Negative.   HENT: Negative.   Eyes: Negative.   Respiratory: Negative.   Cardiovascular: Negative.   Gastrointestinal: Positive for abdominal pain.  Endocrine: Negative.   Genitourinary: Negative.   Musculoskeletal: Negative.   Skin: Negative.   Allergic/Immunologic: Negative.   Neurological: Negative.   Hematological: Negative.   Psychiatric/Behavioral: Negative.     Blood pressure (!) 155/90, temperature 97.7 F (36.5 C), temperature source Oral, resp. rate (!) 22, height 5\' 3"  (1.6 m), weight 70.3 kg, SpO2 97 %. Physical Exam  GENERAL: The patient is AO x3, in no acute distress. HEENT: Head is  normocephalic and atraumatic. EOMI are intact. Mouth is well hydrated and without lesions. NECK: Supple. No masses LUNGS: Clear to auscultation. No presence of rhonchi/wheezing/rales. Adequate chest expansion HEART: RRR, normal s1 and s2. ABDOMEN: Soft, nontender, no guarding, no peritoneal signs, and nondistended. BS +. No masses. EXTREMITIES: Without any cyanosis, clubbing, rash, lesions or edema. NEUROLOGIC: AOx3, no focal motor deficit. SKIN: no jaundice, no rashes  Assessment/Plan Sophia Robbins is a 61 y.o. female with PMH DM, HTN, OSA, hypothyroidism and asthma, who presents for evaluation of right upper quadrant pain and colorectal cancer screening.  We will proceed with EGD and colonoscopy.  76, MD 06/26/2020, 7:38 AM

## 2020-06-26 NOTE — Discharge Instructions (Signed)
You are being discharged to home.  Resume your previous diet.  We are waiting for your pathology results.  Do not take any ibuprofen (including Advil, Motrin or Nuprin), naproxen, or other non-steroidal anti-inflammatory drugs. Your physician has recommended a repeat colonoscopy in 10 years for screening purposes.     Colonoscopy, Adult, Care After This sheet gives you information about how to care for yourself after your procedure. Your doctor may also give you more specific instructions. If you have problems or questions, call your doctor. What can I expect after the procedure? After the procedure, it is common to have:  A small amount of blood in your poop (stool) for 24 hours.  Some gas.  Mild cramping or bloating in your belly (abdomen). Follow these instructions at home: Eating and drinking  Drink enough fluid to keep your pee (urine) pale yellow.  Follow instructions from your doctor about what you cannot eat or drink.  Return to your normal diet as told by your doctor. Avoid heavy or fried foods that are hard to digest.   Activity  Rest as told by your doctor.  Do not sit for a long time without moving. Get up to take short walks every 1-2 hours. This is important. Ask for help if you feel weak or unsteady.  Return to your normal activities as told by your doctor. Ask your doctor what activities are safe for you. To help cramping and bloating:  Try walking around.  Put heat on your belly as told by your doctor. Use the heat source that your doctor recommends, such as a moist heat pack or a heating pad. ? Put a towel between your skin and the heat source. ? Leave the heat on for 20-30 minutes. ? Remove the heat if your skin turns bright red. This is very important if you are unable to feel pain, heat, or cold. You may have a greater risk of getting burned.   General instructions  If you were given a medicine to help you relax (sedative) during your procedure, it can  affect you for many hours. Do not drive or use machinery until your doctor says that it is safe.  For the first 24 hours after the procedure: ? Do not sign important documents. ? Do not drink alcohol. ? Do your daily activities more slowly than normal. ? Eat foods that are soft and easy to digest.  Take over-the-counter or prescription medicines only as told by your doctor.  Keep all follow-up visits as told by your doctor. This is important. Contact a doctor if:  You have blood in your poop 2-3 days after the procedure. Get help right away if:  You have more than a small amount of blood in your poop.  You see large clumps of tissue (blood clots) in your poop.  Your belly is swollen.  You feel like you may vomit (nauseous).  You vomit.  You have a fever.  You have belly pain that gets worse, and medicine does not help your pain. Summary  After the procedure, it is common to have a small amount of blood in your poop. You may also have mild cramping and bloating in your belly.  If you were given a medicine to help you relax (sedative) during your procedure, it can affect you for many hours. Do not drive or use machinery until your doctor says that it is safe.  Get help right away if you have a lot of blood in your poop,  feel like you may vomit, have a fever, or have more belly pain. This information is not intended to replace advice given to you by your health care provider. Make sure you discuss any questions you have with your health care provider. Document Revised: 11/12/2018 Document Reviewed: 08/02/2018 Elsevier Patient Education  2021 Elsevier Inc.  Upper Endoscopy, Adult, Care After This sheet gives you information about how to care for yourself after your procedure. Your health care provider may also give you more specific instructions. If you have problems or questions, contact your health care provider. What can I expect after the procedure? After the procedure, it  is common to have:  A sore throat.  Mild stomach pain or discomfort.  Bloating.  Nausea. Follow these instructions at home:  Follow instructions from your health care provider about what to eat or drink after your procedure.  Return to your normal activities as told by your health care provider. Ask your health care provider what activities are safe for you.  Take over-the-counter and prescription medicines only as told by your health care provider.  If you were given a sedative during the procedure, it can affect you for several hours. Do not drive or operate machinery until your health care provider says that it is safe.  Keep all follow-up visits as told by your health care provider. This is important.   Contact a health care provider if you have:  A sore throat that lasts longer than one day.  Trouble swallowing. Get help right away if:  You vomit blood or your vomit looks like coffee grounds.  You have: ? A fever. ? Bloody, black, or tarry stools. ? A severe sore throat or you cannot swallow. ? Difficulty breathing. ? Severe pain in your chest or abdomen. Summary  After the procedure, it is common to have a sore throat, mild stomach discomfort, bloating, and nausea.  If you were given a sedative during the procedure, it can affect you for several hours. Do not drive or operate machinery until your health care provider says that it is safe.  Follow instructions from your health care provider about what to eat or drink after your procedure.  Return to your normal activities as told by your health care provider. This information is not intended to replace advice given to you by your health care provider. Make sure you discuss any questions you have with your health care provider. Document Revised: 01/04/2019 Document Reviewed: 06/08/2017 Elsevier Patient Education  2021 [employer].

## 2020-06-27 LAB — H. PYLORI ANTIBODY, IGG: H Pylori IgG: 0.65 Index Value (ref 0.00–0.79)

## 2020-06-27 LAB — SURGICAL PATHOLOGY

## 2020-07-03 ENCOUNTER — Other Ambulatory Visit: Payer: Self-pay | Admitting: Family Medicine

## 2020-07-03 DIAGNOSIS — E785 Hyperlipidemia, unspecified: Secondary | ICD-10-CM

## 2020-07-03 DIAGNOSIS — I10 Essential (primary) hypertension: Secondary | ICD-10-CM

## 2020-07-03 DIAGNOSIS — E119 Type 2 diabetes mellitus without complications: Secondary | ICD-10-CM

## 2020-07-04 ENCOUNTER — Encounter (HOSPITAL_COMMUNITY): Payer: Self-pay | Admitting: Gastroenterology

## 2020-07-05 ENCOUNTER — Encounter: Payer: Self-pay | Admitting: Orthopaedic Surgery

## 2020-07-05 ENCOUNTER — Ambulatory Visit: Payer: Federal, State, Local not specified - PPO | Admitting: Orthopaedic Surgery

## 2020-07-05 ENCOUNTER — Other Ambulatory Visit: Payer: Self-pay

## 2020-07-05 VITALS — BP 156/68 | HR 66 | Ht 63.0 in | Wt 146.5 lb

## 2020-07-05 DIAGNOSIS — G8929 Other chronic pain: Secondary | ICD-10-CM

## 2020-07-05 DIAGNOSIS — M25562 Pain in left knee: Secondary | ICD-10-CM | POA: Diagnosis not present

## 2020-07-05 NOTE — Progress Notes (Signed)
I am much better.  She is walking well. She has stopped the brace.  She has no pain.  She has only slight swelling.  Her left knee has full ROM, only slight crepitus and effusion.  Stable.  NV intact.  Gait normal.  Encounter Diagnosis  Name Primary?   Chronic pain of left knee Yes   Return in six weeks.  She may cancel if doing well.  Call if any problem.  Precautions discussed.  Electronically Signed Darreld Mclean, MD 6/16/20228:32 AM

## 2020-07-05 NOTE — Patient Instructions (Signed)
Return to work tomorrow, no restrictions.

## 2020-07-27 ENCOUNTER — Other Ambulatory Visit: Payer: Self-pay

## 2020-07-27 DIAGNOSIS — E039 Hypothyroidism, unspecified: Secondary | ICD-10-CM

## 2020-07-27 MED ORDER — LEVOTHYROXINE SODIUM 25 MCG PO TABS: 50.0000 ug | ORAL_TABLET | Freq: Every day | ORAL | 0 refills | Status: DC

## 2020-07-28 ENCOUNTER — Other Ambulatory Visit: Payer: Self-pay | Admitting: Family Medicine

## 2020-07-28 DIAGNOSIS — E039 Hypothyroidism, unspecified: Secondary | ICD-10-CM

## 2020-07-28 DIAGNOSIS — E119 Type 2 diabetes mellitus without complications: Secondary | ICD-10-CM

## 2020-08-08 ENCOUNTER — Ambulatory Visit: Payer: Federal, State, Local not specified - PPO | Admitting: Nurse Practitioner

## 2020-08-09 ENCOUNTER — Ambulatory Visit: Payer: Federal, State, Local not specified - PPO | Admitting: Nurse Practitioner

## 2020-08-13 ENCOUNTER — Ambulatory Visit: Payer: Federal, State, Local not specified - PPO | Admitting: Adult Health

## 2020-08-13 ENCOUNTER — Telehealth: Payer: Self-pay | Admitting: Adult Health

## 2020-08-13 NOTE — Telephone Encounter (Signed)
08/20/20 OV cancelled due to NP out.

## 2020-08-16 ENCOUNTER — Ambulatory Visit: Payer: Federal, State, Local not specified - PPO | Admitting: Orthopaedic Surgery

## 2020-08-20 ENCOUNTER — Ambulatory Visit: Payer: Federal, State, Local not specified - PPO | Admitting: Adult Health

## 2020-08-23 ENCOUNTER — Other Ambulatory Visit: Payer: Self-pay

## 2020-08-23 ENCOUNTER — Ambulatory Visit: Payer: Federal, State, Local not specified - PPO | Admitting: Nurse Practitioner

## 2020-08-23 ENCOUNTER — Encounter: Payer: Self-pay | Admitting: Nurse Practitioner

## 2020-08-23 VITALS — BP 158/75 | HR 60 | Temp 97.1°F | Ht 63.0 in | Wt 146.0 lb

## 2020-08-23 DIAGNOSIS — I10 Essential (primary) hypertension: Secondary | ICD-10-CM | POA: Diagnosis not present

## 2020-08-23 DIAGNOSIS — E039 Hypothyroidism, unspecified: Secondary | ICD-10-CM | POA: Diagnosis not present

## 2020-08-23 DIAGNOSIS — E119 Type 2 diabetes mellitus without complications: Secondary | ICD-10-CM

## 2020-08-23 DIAGNOSIS — E785 Hyperlipidemia, unspecified: Secondary | ICD-10-CM

## 2020-08-23 DIAGNOSIS — G8929 Other chronic pain: Secondary | ICD-10-CM

## 2020-08-23 DIAGNOSIS — M25512 Pain in left shoulder: Secondary | ICD-10-CM

## 2020-08-23 NOTE — Assessment & Plan Note (Signed)
-  started around April while using crutches -has decreased ROM to left shoulder -referral to ortho

## 2020-08-23 NOTE — Assessment & Plan Note (Signed)
-  checking thyroid labs today 

## 2020-08-23 NOTE — Progress Notes (Signed)
Established Patient Office Visit  Subjective:  Patient ID: Sophia Robbins, female    DOB: Dec 29, 1960  Age: 60 y.o. MRN: [id number]  CC:  Chief Complaint  Patient presents with   Diabetes    Follow up   Shoulder Pain    Had used crutches back in April up until around June and is now having clavicle/shoulder discomfort. Keeping her up at night.    HPI Sophia Robbins presents for lab follow-up.  She has left shoulder pain that started 3-4 months ago while she was using crutches. She has decreased ROm in her left shoulder. She has sternal pain and   Past Medical History:  Diagnosis Date   Allergy    Asthma    Diabetes mellitus without complication ([place])    Hyperlipidemia    OSA on CPAP     Past Surgical History:  Procedure Laterality Date   APPENDECTOMY     BIOPSY  06/26/2020   Procedure: BIOPSY;  Surgeon: Harvel Quale, MD;  [facility];  Service: Gastroenterology;;   CESAREAN SECTION     COLONOSCOPY WITH PROPOFOL N/A 06/26/2020   Procedure: COLONOSCOPY WITH PROPOFOL;  Surgeon: Harvel Quale, MD;  [facility];  Service: Gastroenterology;  Laterality: N/A;  9:00   ESOPHAGOGASTRODUODENOSCOPY (EGD) WITH PROPOFOL N/A 06/26/2020   Procedure: ESOPHAGOGASTRODUODENOSCOPY (EGD) WITH PROPOFOL;  Surgeon: Harvel Quale, MD;  [facility];  Service: Gastroenterology;  Laterality: N/A;   EXPLORATION POST OPERATIVE OPEN HEART     KNEE ARTHROSCOPY AND ARTHROTOMY Left    TUBAL LIGATION      Family History  Problem Relation Age of Onset   Cancer Mother    Cancer Father    COPD Brother    Diabetes Sister     Social History   Socioeconomic History   Marital status: Married    Spouse name: Not on file   Number of children: 1   Years of education: Not on file   Highest education level: Some college, no degree  Occupational History   Occupation: Dietitian    Comment: [employer]  Tobacco Use   Smoking status:  Former    Types: Cigarettes    Quit date: 06/20/2012    Years since quitting: 8.1   Smokeless tobacco: Never  Vaping Use   Vaping Use: Never used  Substance and Sexual Activity   Alcohol use: Yes    Alcohol/week: 1.0 standard drink    Types: 1 Glasses of wine per week    Comment: occasional   Drug use: Not Currently   Sexual activity: Yes    Birth control/protection: Surgical    Comment: tubal  Other Topics Concern   Not on file  Social History Narrative   Lives with husband of 40 years    1 daughter -granddaughter and grandson      Two cats-outside      Enjoys: crafts, Passenger transport manager, redoing home       Diet: eat all food groups, avoids milk -little intolerance   Caffeine: 1 cup coffee, unsweet iced tea   Water: 6 cups daily       Wears seat belt   Smoke detectors at home    Does not use phone while driving    Social Determinants of Health   Financial Resource Strain: Not on file  Food Insecurity: Not on file  Transportation Needs: Not on file  Physical Activity: Not on file  Stress: Not on file  Social Connections: Not  on file  Intimate Partner Violence: Not on file    Outpatient Medications Prior to Visit  Medication Sig Dispense Refill   aspirin EC 81 MG tablet Take 81 mg by mouth daily.     cetirizine (ZYRTEC) 10 MG tablet Take 10 mg by mouth daily.     clobetasol ointment (TEMOVATE) 7.61 % Apply 1 application topically 2 (two) times daily as needed (Skin Rash).     ezetimibe (ZETIA) 10 MG tablet TAKE 1 TABLET BY MOUTH EVERY DAY 90 tablet 2   fluticasone (FLOVENT HFA) 110 MCG/ACT inhaler Inhale 2 puffs into the lungs daily as needed (Shortness of breath).     glucose blood test strip Use as instructed 100 each 12   levothyroxine (SYNTHROID) 25 MCG tablet TAKE 2 TABLETS (50 MCG TOTAL) BY MOUTH DAILY BEFORE BREAKFAST 180 tablet 0   losartan (COZAAR) 100 MG tablet TAKE 1 TABLET BY MOUTH EVERY DAY 90 tablet 2   metFORMIN (GLUCOPHAGE) 500 MG tablet TAKE 2 TABLETS BY MOUTH  EVERY DAY WITH BREAKFAST 180 tablet 0   nystatin cream (MYCOSTATIN) Apply 1 application topically daily as needed for dry skin.     Semaglutide, 1 MG/DOSE, (OZEMPIC, 1 MG/DOSE,) 2 MG/1.5ML SOPN Inject 1 mg into the skin once a week. 18 mL 1   triamcinolone cream (KENALOG) 0.1 % Apply 1 application topically 2 (two) times daily as needed (Rash).     omeprazole (PRILOSEC) 40 MG capsule TAKE 1 CAPSULE (40 MG TOTAL) BY MOUTH DAILY. (Patient not taking: Reported on 08/23/2020) 90 capsule 1   No facility-administered medications prior to visit.    Allergies  Allergen Reactions   Other Itching, Rash, Shortness Of Breath and Swelling   Codeine Hives   Shellfish Allergy Hives   Statins Hives    Blisters and peeling of skin    ROS Review of Systems  Constitutional: Negative.   Respiratory: Negative.    Cardiovascular: Negative.   Musculoskeletal:  Positive for arthralgias.       Left shoulder pain, decreased ROM     Objective:    Physical Exam Constitutional:      Appearance: Normal appearance.  Cardiovascular:     Rate and Rhythm: Normal rate and regular rhythm.     Pulses: Normal pulses.     Heart sounds: Normal heart sounds.  Pulmonary:     Effort: Pulmonary effort is normal.     Breath sounds: Normal breath sounds.  Musculoskeletal:        General: No swelling or tenderness.     Comments: Has pain with ROM exercises and decreased ROM to left shoulder; has pain with adduction, external rotation, and extension  Neurological:     Mental Status: She is alert.    BP (!) 158/75 (BP Location: Right Arm, Patient Position: Sitting, Cuff Size: Normal)   Pulse 60   Temp (!) 97.1 F (36.2 C) (Temporal)   Ht _0  (1.6 m)   Wt 146 lb (66.2 kg)   SpO2 98%   BMI 25.86 kg/m  Wt Readings from Last 3 Encounters:  08/23/20 146 lb (66.2 kg)  07/05/20 146 lb 8 oz (66.5 kg)  06/26/20 155 lb (70.3 kg)     Health Maintenance Due  Topic Date Due   INFLUENZA VACCINE  08/20/2020     There are no preventive care reminders to display for this patient.  Lab Results  Component Value Date   TSH 1.610 04/11/2020   Lab Results  Component Value Date  WBC 6.2 04/11/2020   HGB 13.9 04/11/2020   HCT 41.7 04/11/2020   MCV 93 04/11/2020   PLT 232 04/11/2020   Lab Results  Component Value Date   NA 140 04/11/2020   K 5.1 04/11/2020   CO2 24 04/11/2020   GLUCOSE 110 (H) 04/11/2020   BUN 14 04/11/2020   CREATININE 0.83 04/11/2020   BILITOT 0.3 04/11/2020   ALKPHOS 89 04/11/2020   AST 19 04/11/2020   ALT 13 04/11/2020   PROT 6.9 04/11/2020   ALBUMIN 4.8 04/11/2020   CALCIUM 10.1 04/11/2020   EGFR 80 04/11/2020   Lab Results  Component Value Date   CHOL 250 (H) 04/11/2020   Lab Results  Component Value Date   HDL 59 04/11/2020   Lab Results  Component Value Date   LDLCALC 152 (H) 04/11/2020   Lab Results  Component Value Date   TRIG 215 (H) 04/11/2020   Lab Results  Component Value Date   CHOLHDL 4.4 10/27/2019   Lab Results  Component Value Date   HGBA1C 5.7 (H) 04/11/2020      Assessment & Plan:   Problem List Items Addressed This Visit       Cardiovascular and Mediastinum   Essential hypertension - Primary    BP Readings from Last 3 Encounters:  08/23/20 (!) 158/75  07/05/20 (!) 156/68  06/26/20 (!) 158/72  -BP elevated today, she states she is in pain from her left shoulder -Rx. amlodipine      Relevant Orders   CBC with Differential/Platelet   CMP14+EGFR   Lipid Panel With LDL/HDL Ratio     Endocrine   Diabetes mellitus without complication (HCC)    -checking A1c -she is on ARB, but is statin intolerant -will consider pharmacy consult if LDL > 70 -with DM, goal LDL < 70       Relevant Orders   Lipid Panel With LDL/HDL Ratio   Hemoglobin A1c   Hypothyroidism    -checking thyroid labs today       Relevant Orders   TSH + free T4     Other   Hyperlipidemia LDL goal <70    -checking lipid panel -consider  repatha d/t statin intolerance       Relevant Orders   Lipid Panel With LDL/HDL Ratio   Left shoulder pain    -started around April while using crutches -has decreased ROM to left shoulder -referral to ortho       Relevant Orders   Ambulatory referral to Orthopedic Surgery    No orders of the defined types were placed in this encounter.   Follow-up: Return in about 1 month (around 09/23/2020) for BP check.    Noreene Larsson, NP

## 2020-08-23 NOTE — Patient Instructions (Signed)
Please have labs drawn today.  Your BP is elevated today. I am adding amlodipine since your BP was elevated at your last 3 visits. We will recheck your BP in 1 month. If you have a BP cuff, try checking your BP at home at least once per day.

## 2020-08-23 NOTE — Assessment & Plan Note (Signed)
BP Readings from Last 3 Encounters:  08/23/20 (!) 158/75  07/05/20 (!) 156/68  06/26/20 (!) 158/72   -BP elevated today, she states she is in pain from her left shoulder -Rx. amlodipine

## 2020-08-23 NOTE — Assessment & Plan Note (Signed)
-  checking lipid panel -consider repatha d/t statin intolerance

## 2020-08-23 NOTE — Assessment & Plan Note (Signed)
-  checking A1c -she is on ARB, but is statin intolerant -will consider pharmacy consult if LDL > 70 -with DM, goal LDL < 70

## 2020-08-24 ENCOUNTER — Telehealth: Payer: Self-pay | Admitting: *Deleted

## 2020-08-24 ENCOUNTER — Other Ambulatory Visit: Payer: Self-pay | Admitting: Nurse Practitioner

## 2020-08-24 DIAGNOSIS — E785 Hyperlipidemia, unspecified: Secondary | ICD-10-CM

## 2020-08-24 LAB — CMP14+EGFR
ALT: 9 IU/L (ref 0–32)
AST: 16 IU/L (ref 0–40)
Albumin/Globulin Ratio: 1.4 (ref 1.2–2.2)
Albumin: 4.3 g/dL (ref 3.8–4.8)
Alkaline Phosphatase: 113 IU/L (ref 44–121)
BUN/Creatinine Ratio: 23 (ref 12–28)
BUN: 18 mg/dL (ref 8–27)
Bilirubin Total: 0.3 mg/dL (ref 0.0–1.2)
CO2: 22 mmol/L (ref 20–29)
Calcium: 9.9 mg/dL (ref 8.7–10.3)
Chloride: 102 mmol/L (ref 96–106)
Creatinine, Ser: 0.77 mg/dL (ref 0.57–1.00)
Globulin, Total: 3 g/dL (ref 1.5–4.5)
Glucose: 111 mg/dL — ABNORMAL HIGH (ref 65–99)
Potassium: 5.4 mmol/L — ABNORMAL HIGH (ref 3.5–5.2)
Sodium: 140 mmol/L (ref 134–144)
Total Protein: 7.3 g/dL (ref 6.0–8.5)
eGFR: 88 mL/min/{1.73_m2} (ref >59)

## 2020-08-24 LAB — LIPID PANEL WITH LDL/HDL RATIO
Cholesterol, Total: 192 mg/dL (ref 100–199)
HDL: 55 mg/dL (ref >39)
LDL Chol Calc (NIH): 117 mg/dL — ABNORMAL HIGH (ref 0–99)
LDL/HDL Ratio: 2.1 ratio (ref 0.0–3.2)
Triglycerides: 113 mg/dL (ref 0–149)
VLDL Cholesterol Cal: 20 mg/dL (ref 5–40)

## 2020-08-24 LAB — CBC WITH DIFFERENTIAL/PLATELET
Basophils Absolute: 0 10*3/uL (ref 0.0–0.2)
Basos: 1 %
EOS (ABSOLUTE): 0.2 10*3/uL (ref 0.0–0.4)
Eos: 4 %
Hematocrit: 42 % (ref 34.0–46.6)
Hemoglobin: 13.6 g/dL (ref 11.1–15.9)
Immature Grans (Abs): 0 10*3/uL (ref 0.0–0.1)
Immature Granulocytes: 0 %
Lymphocytes Absolute: 1.9 10*3/uL (ref 0.7–3.1)
Lymphs: 33 %
MCH: 28.9 pg (ref 26.6–33.0)
MCHC: 32.4 g/dL (ref 31.5–35.7)
MCV: 89 fL (ref 79–97)
Monocytes Absolute: 0.4 10*3/uL (ref 0.1–0.9)
Monocytes: 6 %
Neutrophils Absolute: 3.2 10*3/uL (ref 1.4–7.0)
Neutrophils: 56 %
Platelets: 309 10*3/uL (ref 150–450)
RBC: 4.71 x10E6/uL (ref 3.77–5.28)
RDW: 12.3 % (ref 11.7–15.4)
WBC: 5.7 10*3/uL (ref 3.4–10.8)

## 2020-08-24 LAB — TSH+FREE T4
Free T4: 1.2 ng/dL (ref 0.82–1.77)
TSH: 2.93 u[IU]/mL (ref 0.450–4.500)

## 2020-08-24 LAB — HEMOGLOBIN A1C
Est. average glucose Bld gHb Est-mCnc: 105 mg/dL
Hgb A1c MFr Bld: 5.3 % (ref 4.8–5.6)

## 2020-08-24 NOTE — Progress Notes (Signed)
LDL, or bad cholesterol, is improved, but not a goal of < 70. It is at 117. I'll initiate a pharmacy consult so we can try to get some of the newer medicines that aren't statins.

## 2020-08-24 NOTE — Chronic Care Management (AMB) (Signed)
  Care Management   Outreach Note  08/24/2020 Name: Sophia Robbins MRN: [id number] DOB: 02-26-1960  Referred by: Heather Roberts, NP Reason for referral : Care Coordination (Initial outreach to schedule referral with PharmD)   An unsuccessful telephone outreach was attempted today. The patient was referred to the case management team for assistance with care management and care coordination.   Follow Up Plan: A HIPAA compliant phone message was left for the patient providing contact information and requesting a return call.  The care management team will reach out to the patient again over the next 7-14 days.  If patient returns call to provider office, please advise to call Embedded Care Management Care Guide Misty Stanley* at [phone].Gwenevere Ghazi  Care Guide, Embedded Care Coordination [facility]  Direct Dial: [phone]

## 2020-08-28 NOTE — Chronic Care Management (AMB) (Signed)
  Care Management   Outreach Note  08/28/2020 Name: Sophia Robbins MRN: [id number] DOB: 1960-09-27  Referred by: Heather Roberts, NP Reason for referral : Care Coordination (Initial outreach to schedule referral with PharmD)   A second unsuccessful telephone outreach was attempted today. The patient was referred to the case management team for assistance with care management and care coordination.   Follow Up Plan: A HIPAA compliant phone message was left for the patient providing contact information and requesting a return call.  The care management team will reach out to the patient again over the next 5 days.  If patient returns call to provider office, please advise to call Embedded Care Management Care Guide Penne Lash  at [phone]  Penne Lash, RMA Care Guide, Embedded Care Coordination [facility]  [place], [place] [address] Direct Dial: [phone] [email or web address]

## 2020-08-29 ENCOUNTER — Telehealth: Payer: Self-pay

## 2020-08-29 NOTE — Telephone Encounter (Signed)
Left message. See lab note.

## 2020-08-29 NOTE — Telephone Encounter (Signed)
Patient returned your call in regards to lab results ph# [phone]

## 2020-08-30 ENCOUNTER — Ambulatory Visit: Payer: Federal, State, Local not specified - PPO | Admitting: Pharmacist

## 2020-08-30 DIAGNOSIS — E785 Hyperlipidemia, unspecified: Secondary | ICD-10-CM

## 2020-08-30 NOTE — Chronic Care Management (AMB) (Signed)
  Care Management   Outreach Note  08/30/2020 Name: Sophia Robbins MRN: [id number] DOB: December 06, 1960  Referred by: Heather Roberts, NP Reason for referral : Care Coordination (Initial outreach to schedule referral with PharmD)   Third unsuccessful telephone outreach was attempted today. The patient was referred to the case management team for assistance with care management and care coordination. The patient's primary care provider has been notified of our unsuccessful attempts to make or maintain contact with the patient. The care management team is pleased to engage with this patient at any time in the future should he/she be interested in assistance from the care management team.   Follow Up Plan: We have been unable to make contact with the patient for follow up. The care management team is available to follow up with the patient after provider conversation with the patient regarding recommendation for care management engagement and subsequent re-referral to the care management team.    Burman Nieves, CCMA Care Guide, Embedded Care Coordination [facility]  Care Management  Direct Dial: [phone]

## 2020-08-30 NOTE — Patient Instructions (Addendum)
[name],  Visit Information  PATIENT GOALS:   Goals Addressed             This Visit's Progress    Medication Management       Patient Goals/Self-Care Activities Over the next 90  days, patient will:  Collaborate with provider on medication access solutions        Ms. Donze was given information on her voicemail about Care Management services by the embedded care coordination team including:  Care Management services include personalized support from designated clinical staff supervised by her physician, including individualized plan of care and coordination with other care providers 24/7 contact phone numbers for assistance for urgent and routine care needs. The patient may stop CCM services at any time (effective at the end of the month) by phone call to the office staff.  I have submitted a prior authorization to your insurance company to see if they will approve coverage for the injectable medication prescribed to you to lower your LDL (bad cholesterol). I have also called your pharmacy and given them a co-pay card. Please see the below information regarding how the co-pay card works. If you have any issues, you can call [phone]-REPATHA ([phone]).   The Repatha Copay Card helps provide out-of-pocket support to eligible patients for their Repatha prescription up to program limits. See PROGRAM DETAILS for full description.  The Repatha Copay Card offer does not cover out-of-pocket costs for any patient whose selected coverage option under their commercial insurance plan does not apply Repatha Copay Card payments to satisfy the patient's copayment, deductible, or co-insurance for Repatha. Patients with these plan limitations are not eligible for the Repatha Copay Card but may be eligible for other needs-based assistance provided by [employer]. These programs are often referred to as accumulator adjustment programs. If you believe your commercial insurance plan may have  such limitations, please contact RepathaReady at 1-844-REPATHA [phone]).  The Repatha Copay Card also may provide a reduced benefit amount, unilaterally determined by [employer] in its sole discretion, to satisfy the out-of-pocket cost-sharing requirement for any patient whose plan or plan agent (including, but not limited to, a Furniture conservator/restorer (PBM)) requires enrollment in the Repatha Copay Card as a condition of the plan or PBM waiving some or all of an otherwise applicable patient out-of-pocket cost-sharing amount. These programs are often referred to as copay maximizer programs. If you believe your commercial insurance plan may have such limitations, please contact RepathaReady at 1-844-REPATHA [phone]). Health plans, specialty pharmacies, and Pharmacy Benefit Managers (individually and collectively "Plan Administrators") are prohibited from enrolling patients in the Repatha Copay Card. Plan Administrators are prohibited from assisting patients with enrollment in the Repatha Copay Card. The patient, or his/her legal representative, must personally enroll in the Repatha Copay Card in order to be eligible for program benefits.  If at any time a patient begins receiving prescription drug coverage under any state or government program (including but not limited to [employer], Medicaid, TRICARE, Department of Defense, or [employer]), the patient will no longer be able to use this card and they must contact RepathaReady at 1-844-REPATHA [phone]) to stop their participation in this program.  Patients may not seek reimbursement for the value received from the Repatha Copay Card from any third-party payers, including a flexible spending account or healthcare savings account. Participating in this program means that you are ensuring you comply with any required disclosure regarding your participation in the Repatha Copay Card of your insurance carrier or Pharmacy  Benefit Manager. Restrictions may apply. Offer is subject to change or discontinuation without notice. This is not health insurance.  PROGRAM DETAILS With the Repatha Copay Card, a commercially insured patient who meets eligibility criteria may pay as little as a $5 Copay per month for their Repatha monthly out-of-pocket costs.  For all eligible patients, the Repatha Copay Card offers: A program benefit that covers the patient's eligible out-of-pocket prescription costs for Repatha (copay, deductible, or co-insurance) on behalf of the patient, up to a Maximum Monthly Benefit and/or a Maximum Annual Program Benefit. Repatha patients may pay $5 out of pocket at the first fill and at every refill, and [employer] will pay on behalf of the patient the remaining eligible out-of-pocket prescription costs (up to the Patient Total Program Benefit described below; Repatha patients are responsible for all amounts that exceed this limit). The Maximum Monthly Benefit will apply every month except that the first three (3) fills for Repatha in each calendar year will not have a Maximum Monthly Benefit. Maximum Monthly Benefit, Maximum Annual Program Benefit, and/or Patient Total Program Benefit and Benefits May Change, End, or Vary without notice. The Maximum Annual Program Benefit must be applied to the Repatha patient's out-of-pocket costs (copay, deductible, or co-insurance). The Patient Total Program Benefit amounts are unilaterally determined by [employer] in its sole discretion and will not exceed the Maximum Monthly Benefit or Maximum Annual Program Benefit. The Patient Total Program Benefit may be less than the Maximum Monthly Benefit or Maximum Annual Program Benefit, depending on the terms of a patient's prescription drug plan, and may vary among individual patients covered by different plans, based on factors determined solely by [employer], to ensure all programs funds are used for the benefit of the patient. Each  patient is responsible for costs above the Patient Total Program Benefit amounts. Please ask your RepathaReady representative to help you understand whether your particular insurance coverage is likely to result in your reaching the Maximum Monthly Benefit, Maximum Annual Program Benefit or your Patient Total Program Benefit amount by calling 1-844-REPATHA [phone]) and follow the prompts. Participating patients are solely responsible for updating [employer] with changes to their prescription health insurance including, but not limited to, initiation of insurance provided by the government, the addition of any coverage terms that do not apply Repatha Copay Card benefits to reduce a patient's out-of-pocket costs, such as accumulator adjustment benefit design or a copay maximization program. Participating patients are responsible for providing [employer] with accurate information necessary to determine program eligibility. By accepting payments from [employer] made on behalf of participating patients, participating PBMs and Plans likewise are responsible for providing [employer] with accurate information regarding patient eligibility. Patients may use the card every time they fill their Repatha prescription. Benefits reset each calendar year. Re-enrollment in the program is required at regular intervals. Patients may participate in the program as long as s/he re-enrolls as required by [employer] and s/he continues to meet all of the program's eligibility requirements during participation in the program. Patients can enroll/re-enroll by calling 1-844-REPATHA [phone]) or by going to [email or web address].   Let me know if you have any further questions. If/when your insurance approved this medication, I call call your pharmacy to tell them and have them fill it for you.  Domenic Moras, PharmD Clinical Pharmacist [facility] [phone]

## 2020-08-30 NOTE — Chronic Care Management (AMB) (Signed)
Care Management   Pharmacy Note  08/30/2020 Name: Sophia Robbins MRN: [id number] DOB: 11-11-1960  Subjective: Sophia Robbins is a 60 y.o. year old female who is a primary care patient of Heather Roberts, NP. The Care Management team was consulted for assistance with care management and care coordination needs.    Unable to reach patient by phone but collaborated via telephone with [facility] and Repatha Co-pay card assistance in response to provider referral for pharmacy case management and/or care coordination services.   Prior authorization is required through patient's prescription coverage.   The patient's husband was successfully reached and was given information about Care Management services today including:  Care Management services includes personalized support from designated clinical staff supervised by the patient's primary care provider, including individualized plan of care and coordination with other care providers. 24/7 contact phone numbers for assistance for urgent and routine care needs. The patient may stop case management services at any time by phone call to the office staff.  Voicemail was left on patient's mobile phone explaining the above services provided. Phone number was left for call back should she need additional information.   Assessment:  Review of patient status, including review of consultants reports, laboratory and other test data, was performed as part of comprehensive evaluation and provision of chronic care management services.   Objective:  Lab Results  Component Value Date   CREATININE 0.77 08/23/2020   CREATININE 0.83 04/11/2020   CREATININE 0.81 01/30/2020    Lab Results  Component Value Date   HGBA1C 5.3 08/23/2020       Component Value Date/Time   CHOL 192 08/23/2020 0858   TRIG 113 08/23/2020 0858   HDL 55 08/23/2020 0858   CHOLHDL 4.4 10/27/2019 0903   LDLCALC 117 (H) 08/23/2020 0858   LDLCALC 156 (H) 10/27/2019 0903     Clinical ASCVD: No  The 10-year ASCVD risk score [name] Jr., et al., 2013) is: 13.5%   Values used to calculate the score:     Age: 89 years     Sex: Female     Is Non-Hispanic African American: No     Diabetic: Yes     Tobacco smoker: No     Systolic Blood Pressure: 158 mmHg     Is BP treated: Yes     HDL Cholesterol: 55 mg/dL     Total Cholesterol: 192 mg/dL    Other: ([id number] if Afib, PHQ9 if depression, MMRC or CAT for COPD, ACT, DEXA)  BP Readings from Last 3 Encounters:  08/23/20 (!) 158/75  07/05/20 (!) 156/68  06/26/20 (!) 158/72    Care Plan  Allergies  Allergen Reactions   Other Itching, Rash, Shortness Of Breath and Swelling   Codeine Hives   Shellfish Allergy Hives   Statins Hives    Blisters and peeling of skin    Medications Reviewed Today     Reviewed by Gavin Pound, [facility] (Pharmacist) on 08/30/20 at 1418  Med List Status: <None>   Medication Order Taking? Sig Documenting Provider Last Dose Status Informant  aspirin EC 81 MG tablet [id number] No Take 81 mg by mouth daily. [provider] Taking Active Self  cetirizine (ZYRTEC) 10 MG tablet [id number] No Take 10 mg by mouth daily. [provider] Taking Active Self  clobetasol ointment (TEMOVATE) 0.05 % [id number] No Apply 1 application topically 2 (two) times daily as needed (Skin Rash). [provider] Taking Active Self  ezetimibe (ZETIA) 10 MG tablet [id number]  No TAKE 1 TABLET BY MOUTH EVERY DAY Heather Roberts, NP Taking Active   fluticasone (FLOVENT HFA) 110 MCG/ACT inhaler [id number] No Inhale 2 puffs into the lungs daily as needed (Shortness of breath). [provider] Taking Active Self  glucose blood test strip [id number] No Use as instructed Heather Roberts, NP Taking Active Self  levothyroxine (SYNTHROID) 25 MCG tablet [id number] No TAKE 2 TABLETS (50 MCG TOTAL) BY MOUTH DAILY BEFORE BREAKFAST Kerri Perches, MD Taking Active   losartan (COZAAR)  100 MG tablet [id number] No TAKE 1 TABLET BY MOUTH EVERY DAY Heather Roberts, NP Taking Active   metFORMIN (GLUCOPHAGE) 500 MG tablet [id number] No TAKE 2 TABLETS BY MOUTH EVERY DAY WITH BREAKFAST Kerri Perches, MD Taking Active   nystatin cream (MYCOSTATIN) [id number] No Apply 1 application topically daily as needed for dry skin. [provider] Taking Active Self           Med Note Andee Poles, Lilyan Punt   Tue Jun 26, 2020  7:21 AM) Hasnt used  Semaglutide, 1 MG/DOSE, (OZEMPIC, 1 MG/DOSE,) 2 MG/1.5ML SOPN [id number] No Inject 1 mg into the skin once a week. Freddy Finner, NP Taking Active Self  triamcinolone cream (KENALOG) 0.1 % [id number] No Apply 1 application topically 2 (two) times daily as needed (Rash). [provider] Taking Active Self           Med Note Marius Ditch   Tue Jun 26, 2020  7:21 AM) Hasnt used            Patient Active Problem List   Diagnosis Date Noted   Left shoulder pain 08/23/2020   Eye exam, routine 10/27/2019   Vitamin D deficiency 10/27/2019   Hypothyroidism 05/25/2019   OSA (obstructive sleep apnea) 04/14/2019   Essential hypertension 04/14/2019   Hyperlipidemia LDL goal <70 04/14/2019   Diabetes mellitus without complication (HCC) 04/14/2019   Annual visit for general adult medical examination with abnormal findings 04/14/2019    Conditions to be addressed/monitored: HLD  Care Plan : General Pharmacy (Adult)  Updates made by Gavin Pound, RPH since 08/30/2020 12:00 AM     Problem: Hyperlipidemia   Priority: High  Onset Date: 08/30/2020     Long-Range Goal: Disease Progression Prevention   Start Date: 08/30/2020  Expected End Date: 11/28/2020  This Visit's Progress: On track  Priority: High  Note:   Current Barriers:  Unable to independently afford treatment regimen Unable to achieve control of hyperlipidemia Suboptimal therapeutic regimen for hyperlipidemia  Pharmacist Clinical Goal(s):  Over the next 90   days, patient will Verbalize ability to afford treatment regimen Achieve control of hyperlipidemia as evidenced by improved LDL through collaboration with PharmD and provider.   Interventions: 1:1 collaboration with Heather Roberts, NP regarding development and update of comprehensive plan of care as evidenced by provider attestation and co-signature Inter-disciplinary care team collaboration (see longitudinal plan of care) Comprehensive medication review performed; medication list updated in electronic medical record  Hyperlipidemia: Current medications: ezetimibe 10 mg by mouth once daily Intolerances:  statins (severe rash) Taking medications as directed: yes Side effects thought to be attributed to current medication regimen: no Uncontrolled; LDL above goal of <70 due to very high risk given diabetes + at least 1 additional major risk factor (elevated LDL cholesterol and hypertension) per 2020 AACE/ACE guidelines and TG at goal of <150 per 2020 AACE/ACE guidelines Continue ezetimibe 10 mg by mouth once daily Add evolocumab (Repatha) 140 mg  subcutaneously every 2 weeks Recommend regular aerobic exercise Re-check lipid panel in 4-12 weeks Statin intolerance noted. No statin due to serious side effects (ex. Myalgias with at least 2 different statins)  Patient Goals/Self-Care Activities Over the next 90  days, patient will:  Collaborate with provider on medication access solutions  Follow Up Plan: The patient has been provided with contact information for the care management team and has been advised to call with any health related questions or concerns.  Next PCP appointment scheduled for: 09/26/20      Medication Assistance: prior authorization through [employer] employee program was completed and faxed today. Repatha co-pay card was acquired and has been set up at pharmacy.  Follow Up: Will inform pharmacy to fill Repatha if/when prior authorization is approved. Patient voicemail was  left informing her of this as well as my contact information. Will add Repatha back to medication list if/when patient able to receive it.   Plan: The patient has been provided with contact information for the care management team and has been advised to call with any health related questions or concerns and next PCP appointment scheduled for: 09/26/20  Domenic Moras, PharmD Clinical Pharmacist [facility] [phone]

## 2020-09-03 ENCOUNTER — Telehealth: Payer: Self-pay

## 2020-09-03 NOTE — Telephone Encounter (Signed)
See lab note.  

## 2020-09-03 NOTE — Telephone Encounter (Signed)
Patient calling needing lab results

## 2020-09-04 ENCOUNTER — Ambulatory Visit: Payer: Federal, State, Local not specified - PPO | Admitting: Pharmacist

## 2020-09-04 DIAGNOSIS — E785 Hyperlipidemia, unspecified: Secondary | ICD-10-CM

## 2020-09-04 MED ORDER — REPATHA SURECLICK 140 MG/ML ~~LOC~~ SOAJ: 140.0000 mg | SUBCUTANEOUS | 11 refills | Status: DC

## 2020-09-04 NOTE — Chronic Care Management (AMB) (Signed)
Care Management   Pharmacy Note  09/04/2020 Name: Sophia Robbins MRN: [id number] DOB: 1960-10-30  Recommend checking lipid panel in 4-12 weeks after patient is able to initiate therapy and take at least 2 doses.   Subjective: Sophia Robbins is a 60 y.o. year old female who is a primary care patient of Heather Roberts, NP. The Care Management team was consulted for assistance with care management and care coordination needs.    Collaboration with patient's insurance and pharmacy  for  Repatha prior authorization  in response to provider referral for pharmacy case management and/or care coordination services.   The patient was given information about Care Management services today including:  Care Management services includes personalized support from designated clinical staff supervised by the patient's primary care provider, including individualized plan of care and coordination with other care providers. 24/7 contact phone numbers for assistance for urgent and routine care needs. The patient may stop case management services at any time by phone call to the office staff.  Patient agreed to services and consent obtained.  Assessment:  Review of patient status, including review of consultants reports, laboratory and other test data, was performed as part of comprehensive evaluation and provision of chronic care management services.   SDOH (Social Determinants of Health) assessments and interventions performed:    Objective:  Lab Results  Component Value Date   CREATININE 0.77 08/23/2020   CREATININE 0.83 04/11/2020   CREATININE 0.81 01/30/2020    Lab Results  Component Value Date   HGBA1C 5.3 08/23/2020       Component Value Date/Time   CHOL 192 08/23/2020 0858   TRIG 113 08/23/2020 0858   HDL 55 08/23/2020 0858   CHOLHDL 4.4 10/27/2019 0903   LDLCALC 117 (H) 08/23/2020 0858   LDLCALC 156 (H) 10/27/2019 0903    Clinical ASCVD: No  The 10-year ASCVD risk score Sophia George DC  Jr., et al., 2013) is: 13.5%   Values used to calculate the score:     Age: 16 years     Sex: Female     Is Non-Hispanic African American: No     Diabetic: Yes     Tobacco smoker: No     Systolic Blood Pressure: 158 mmHg     Is BP treated: Yes     HDL Cholesterol: 55 mg/dL     Total Cholesterol: 192 mg/dL     BP Readings from Last 3 Encounters:  08/23/20 (!) 158/75  07/05/20 (!) 156/68  06/26/20 (!) 158/72    Care Plan  Allergies  Allergen Reactions   Other Itching, Rash, Shortness Of Breath and Swelling   Codeine Hives   Shellfish Allergy Hives   Statins Hives    Blisters and peeling of skin    Medications Reviewed Today     Reviewed by Gavin Pound, [facility] (Pharmacist) on 09/04/20 at 0915  Med List Status: <None>   Medication Order Taking? Sig Documenting Provider Last Dose Status Informant  aspirin EC 81 MG tablet [id number] Yes Take 81 mg by mouth daily. [provider] Taking Active Self  cetirizine (ZYRTEC) 10 MG tablet [id number] Yes Take 10 mg by mouth daily. [provider] Taking Active Self  clobetasol ointment (TEMOVATE) 0.05 % [id number] Yes Apply 1 application topically 2 (two) times daily as needed (Skin Rash). [provider] Taking Active Self  Evolocumab (REPATHA SURECLICK) 140 MG/ML SOAJ [id number] No Inject 140 mg into the skin every 14 (fourteen) days. [provider] Unknown Active Self  ezetimibe (ZETIA) 10 MG tablet [id number] Yes TAKE 1 TABLET BY MOUTH EVERY DAY Heather Roberts, NP Taking Active   fluticasone (FLOVENT HFA) 110 MCG/ACT inhaler [id number] Yes Inhale 2 puffs into the lungs daily as needed (Shortness of breath). [provider] Taking Active Self  glucose blood test strip [id number]  Use as instructed Heather Roberts, NP  Active Self  levothyroxine (SYNTHROID) 25 MCG tablet [id number] Yes TAKE 2 TABLETS (50 MCG TOTAL) BY MOUTH DAILY BEFORE BREAKFAST Sophia Perches, MD Taking Active    losartan (COZAAR) 100 MG tablet [id number] Yes TAKE 1 TABLET BY MOUTH EVERY DAY Heather Roberts, NP Taking Active   metFORMIN (GLUCOPHAGE) 500 MG tablet [id number] Yes TAKE 2 TABLETS BY MOUTH EVERY DAY WITH BREAKFAST Sophia Perches, MD Taking Active   nystatin cream (MYCOSTATIN) [id number] No Apply 1 application topically daily as needed for dry skin.  Patient not taking: Reported on 09/04/2020   [provider] Not Taking Active Self           Med Note Sophia Robbins   Tue Jun 26, 2020  7:21 AM) Hasnt used  Semaglutide, 1 MG/DOSE, (OZEMPIC, 1 MG/DOSE,) 2 MG/1.5ML SOPN [id number] Yes Inject 1 mg into the skin once a week. Freddy Finner, NP Taking Active Self  triamcinolone cream (KENALOG) 0.1 % [id number] No Apply 1 application topically 2 (two) times daily as needed (Rash).  Patient not taking: Reported on 09/04/2020   [provider] Not Taking Active Self           Med Note Sophia Robbins   Tue Jun 26, 2020  7:21 AM) Hasnt used            Patient Active Problem List   Diagnosis Date Noted   Left shoulder pain 08/23/2020   Eye exam, routine 10/27/2019   Vitamin D deficiency 10/27/2019   Hypothyroidism 05/25/2019   OSA (obstructive sleep apnea) 04/14/2019   Essential hypertension 04/14/2019   Hyperlipidemia LDL goal <70 04/14/2019   Diabetes mellitus without complication (HCC) 04/14/2019   Annual visit for general adult medical examination with abnormal findings 04/14/2019    Conditions to be addressed/monitored: HLD  Care Plan : General Pharmacy (Adult)  Updates made by Gavin Pound, RPH since 09/04/2020 12:00 AM     Problem: Hyperlipidemia   Priority: High  Onset Date: 08/30/2020     Long-Range Goal: Disease Progression Prevention   Start Date: 08/30/2020  Expected End Date: 11/28/2020  Recent Progress: On track  Priority: High  Note:   Current Barriers:  Unable to independently afford treatment regimen Unable to achieve control of  hyperlipidemia Suboptimal therapeutic regimen for hyperlipidemia  Pharmacist Clinical Goal(s):  Over the next 90  days, patient will Verbalize ability to afford treatment regimen Achieve control of hyperlipidemia as evidenced by improved LDL through collaboration with PharmD and provider.   Interventions: 1:1 collaboration with Heather Roberts, NP regarding development and update of comprehensive plan of care as evidenced by provider attestation and co-signature Inter-disciplinary care team collaboration (see longitudinal plan of care) Comprehensive medication review performed; medication list updated in electronic medical record  Hyperlipidemia: Current medications: ezetimibe 10 mg by mouth once daily Intolerances:  statins (severe rash) Taking medications as directed: yes Side effects thought to be attributed to current medication regimen: no Uncontrolled; LDL above goal of <70 due to very high risk given diabetes + at least 1 additional major risk factor (elevated LDL cholesterol and hypertension) per 2020  AACE/ACE guidelines and TG at goal of <150 per 2020 AACE/ACE guidelines Continue ezetimibe 10 mg by mouth once daily Add evolocumab (Repatha) 140 mg subcutaneously every 2 weeks Recommend regular aerobic exercise Re-check lipid panel in 4-12 weeks Statin intolerance noted. No statin due to serious side effects (ex. Myalgias with at least 2 different statins) Called patient's insurance and confirmed approval of Repatha through 09/01/21. Patient's estimated co-pay for a 28 day supply is $80.79. Repatha co-pay card has been provided to pharmacy and is active which should further help lower monthly cost to as low as $5.   Patient Goals/Self-Care Activities Over the next 90  days, patient will:  Collaborate with provider on medication access solutions  Follow Up Plan: The patient has been provided with contact information for the care management team and has been advised to call with any  health related questions or concerns.  Next PCP appointment scheduled for: 09/26/20      [facility] FEP; however, reported issue with co-pay card. Repatha Ready Counselor was contacted who called the pharmacy and [facility] was still unable to process the [facility] with the co-pay card. Prescription sent to [facility] per patient request. [facility] was successfully able to bill the [employer] and co-pay card.  Repatha Co-pay Card Rx BIN: [id number] PCN: [id number] Grp: [id number] ID: [id number] Therapy First Exp: 08/31/23  Follow Up: [facility] has been contacted and was able to successfully fill prescription for Repatha. Patient was contacted and updated that prescription will be ready later today.   Plan: Next PCP appointment scheduled for: 09/26/20  Domenic Moras, PharmD Clinical Pharmacist [facility] [phone]

## 2020-09-04 NOTE — Patient Instructions (Addendum)
Noell Lorensen,  Your insurance has approved your Repatha through 09/01/21. Your prescription was sent to [facility] and a Co-pay card was attached. Please visit [email or web address] to review co-pay card terms and conditions.   Please call me with any questions or concerns.   Visit Information   Goals Addressed             This Visit's Progress    COMPLETED: Medication Management       Patient Goals/Self-Care Activities Over the next 90  days, patient will:  Collaborate with provider on medication access solutions         Patient verbalizes understanding of instructions provided today and agrees to view in MyChart.   Next PCP appointment scheduled for: 09/26/20  Domenic Moras, PharmD Clinical Pharmacist [facility] [phone]

## 2020-09-06 ENCOUNTER — Ambulatory Visit: Payer: Federal, State, Local not specified - PPO | Admitting: Pharmacist

## 2020-09-06 DIAGNOSIS — E785 Hyperlipidemia, unspecified: Secondary | ICD-10-CM

## 2020-09-06 NOTE — Chronic Care Management (AMB) (Signed)
Care Management   Pharmacy Note  09/06/2020 Name: Sophia Robbins MRN: [id number] DOB: [birth year]  Summary:  Patient called to confirm receipt of Repatha from pharmacy and wanted to confirm that she was to continue ezetimibe to which I confirmed.  Patient plans to take first injection of Repatha on 07/08/20 Patient was instructed on appropriate self administration technique for injection of evolocumab (Repatha) 140 mg subcutaneously every 2 weeks Re-check lipid panel in 4-12 weeks after at least 2 injections of Repatha  Subjective: Sophia Robbins is a 60 y.o. year old female who is a primary care patient of Heather Roberts, NP. The Care Management team was consulted for assistance with care management and care coordination needs.    Engaged with patient by telephone for follow up visit in response to provider referral for pharmacy case management and/or care coordination services.   The patient was given information about Care Management services today including:  Care Management services includes personalized support from designated clinical staff supervised by the patient's primary care provider, including individualized plan of care and coordination with other care providers. 24/7 contact phone numbers for assistance for urgent and routine care needs. The patient may stop case management services at any time by phone call to the office staff.  Patient agreed to services and consent obtained.  Assessment:  Review of patient status, including review of consultants reports, laboratory and other test data, was performed as part of comprehensive evaluation and provision of chronic care management services.   SDOH (Social Determinants of Health) assessments and interventions performed:    Objective:  Lab Results  Component Value Date   CREATININE 0.77 08/23/2020   CREATININE 0.83 04/11/2020   CREATININE 0.81 01/30/2020    Lab Results  Component Value Date   HGBA1C 5.3 08/23/2020        Component Value Date/Time   CHOL 192 08/23/2020 0858   TRIG 113 08/23/2020 0858   HDL 55 08/23/2020 0858   CHOLHDL 4.4 10/27/2019 0903   LDLCALC 117 (H) 08/23/2020 0858   LDLCALC 156 (H) 10/27/2019 0903    Other: (TSH, CBC, Vit D, etc.)  Clinical ASCVD: No  The 10-year ASCVD risk score [name] Jr., et al., 2013) is: 13.5%   Values used to calculate the score:     Age: 60 years     Sex: Female     Is Non-Hispanic African American: No     Diabetic: Yes     Tobacco smoker: No     Systolic Blood Pressure: 158 mmHg     Is BP treated: Yes     HDL Cholesterol: 55 mg/dL     Total Cholesterol: 192 mg/dL    Other: ([id number] if Afib, PHQ9 if depression, MMRC or CAT for COPD, ACT, DEXA)  BP Readings from Last 3 Encounters:  08/23/20 (!) 158/75  07/05/20 (!) 156/68  06/26/20 (!) 158/72    Care Plan  Allergies  Allergen Reactions   Other Itching, Rash, Shortness Of Breath and Swelling   Codeine Hives   Shellfish Allergy Hives   Statins Hives    Blisters and peeling of skin    Medications Reviewed Today     Reviewed by Gavin Pound, [facility] (Pharmacist) on 09/06/20 at 1631  Med List Status: <None>   Medication Order Taking? Sig Documenting Provider Last Dose Status Informant  aspirin EC 81 MG tablet [id number] Yes Take 81 mg by mouth daily. [provider] Taking Active Self  cetirizine (ZYRTEC) 10 MG tablet [id number]  Yes Take 10 mg by mouth daily. [provider] Taking Active Self  clobetasol ointment (TEMOVATE) 0.05 % [id number] Yes Apply 1 application topically 2 (two) times daily as needed (Skin Rash). [provider] Taking Active Self  Evolocumab (REPATHA SURECLICK) 140 MG/ML SOAJ [id number] No Inject 140 mg into the skin every 14 (fourteen) days.  Patient not taking: Reported on 09/06/2020   Heather Roberts, NP Not Taking Active            Med Note Norma Fredrickson Sep 06, 2020  4:31 PM) Patient plans to start on 09/07/20   ezetimibe (ZETIA) 10 MG tablet [id number] Yes TAKE 1 TABLET BY MOUTH EVERY DAY Heather Roberts, NP Taking Active   fluticasone (FLOVENT HFA) 110 MCG/ACT inhaler [id number] Yes Inhale 2 puffs into the lungs daily as needed (Shortness of breath). [provider] Taking Active Self  glucose blood test strip [id number]  Use as instructed Heather Roberts, NP  Active Self  levothyroxine (SYNTHROID) 25 MCG tablet [id number] Yes TAKE 2 TABLETS (50 MCG TOTAL) BY MOUTH DAILY BEFORE BREAKFAST Kerri Perches, MD Taking Active   losartan (COZAAR) 100 MG tablet [id number] Yes TAKE 1 TABLET BY MOUTH EVERY DAY Heather Roberts, NP Taking Active   metFORMIN (GLUCOPHAGE) 500 MG tablet [id number] Yes TAKE 2 TABLETS BY MOUTH EVERY DAY WITH BREAKFAST Kerri Perches, MD Taking Active   nystatin cream (MYCOSTATIN) [id number] No Apply 1 application topically daily as needed for dry skin.  Patient not taking: No sig reported   [provider] Not Taking Active            Med Note Marius Ditch   Tue Jun 26, 2020  7:21 AM) Hasnt used  Semaglutide, 1 MG/DOSE, (OZEMPIC, 1 MG/DOSE,) 2 MG/1.5ML SOPN [id number] Yes Inject 1 mg into the skin once a week. Freddy Finner, NP Taking Active Self  triamcinolone cream (KENALOG) 0.1 % [id number] No Apply 1 application topically 2 (two) times daily as needed (Rash).  Patient not taking: No sig reported   [provider] Not Taking Active            Med Note Marius Ditch   Tue Jun 26, 2020  7:21 AM) Hasnt used            Patient Active Problem List   Diagnosis Date Noted   Left shoulder pain 08/23/2020   Eye exam, routine 10/27/2019   Vitamin D deficiency 10/27/2019   Hypothyroidism 05/25/2019   OSA (obstructive sleep apnea) 04/14/2019   Essential hypertension 04/14/2019   Hyperlipidemia LDL goal <70 04/14/2019   Diabetes mellitus without complication (HCC) 04/14/2019   Annual visit for general adult medical examination with abnormal  findings 04/14/2019    Conditions to be addressed/monitored: HLD  Care Plan : General Pharmacy (Adult)  Updates made by Gavin Pound, RPH since 09/06/2020 12:00 AM     Problem: Hyperlipidemia   Priority: High  Onset Date: 08/30/2020     Long-Range Goal: Disease Progression Prevention Completed 09/06/2020  Start Date: 08/30/2020  Expected End Date: 11/28/2020  Recent Progress: On track  Priority: High  Note:   Current Barriers:  Unable to achieve control of hyperlipidemia  Pharmacist Clinical Goal(s):  Over the next 90  days, patient will Achieve control of hyperlipidemia as evidenced by improved LDL through collaboration with PharmD and provider.   Interventions: 1:1 collaboration with Heather Roberts, NP regarding development and update of comprehensive  plan of care as evidenced by provider attestation and co-signature Inter-disciplinary care team collaboration (see longitudinal plan of care) Comprehensive medication review performed; medication list updated in electronic medical record  Hyperlipidemia: Current medications: ezetimibe 10 mg by mouth once daily Intolerances:  statins (severe rash) Taking medications as directed: yes Side effects thought to be attributed to current medication regimen: no Uncontrolled; LDL above goal of <70 due to very high risk given diabetes + at least 1 additional major risk factor (elevated LDL cholesterol and hypertension) per 2020 AACE/ACE guidelines and TG at goal of <150 per 2020 AACE/ACE guidelines Statin intolerance noted. No statin due to serious side effects (ex. Myalgias with at least 2 different statins) Continue ezetimibe 10 mg by mouth once daily and evolocumab (Repatha) 140 mg subcutaneously every 2 weeks Recommend regular aerobic exercise Patient called to confirm receipt of Repatha from pharmacy and wanted to confirm that she was to continue ezetimibe to which I confirmed.  Patient was instructed on appropriate self  administration technique for injection of evolocumab (Repatha) 140 mg subcutaneously every 2 weeks Re-check lipid panel in 4-12 weeks after at least 2 injections of Repatha Patient's insurance requires a prior authorization for Repatha which has been approved through 09/01/21. Patient's estimated co-pay for a 28 day supply is $80.79. Repatha co-pay card has been provided to pharmacy and is active which should further help lower monthly cost to as low as $5.   Patient Goals/Self-Care Activities Over the next 90  days, patient will:  Collaborate with provider on medication access solutions  Follow Up Plan: The patient has been provided with contact information for the care management team and has been advised to call with any health related questions or concerns.  Next PCP appointment scheduled for: 09/26/20       Follow Up:  Patient requests no follow-up at this time.  Plan: Next PCP appointment scheduled for:  09/26/20  Domenic Moras, PharmD Clinical Pharmacist [facility] [phone]

## 2020-09-06 NOTE — Patient Instructions (Addendum)
[name],  It was great to talk to you today! Please continue to take your ezetimibe 10 mg by mouth daily when you start taking your Repatha injection every 2 weeks. Please see below for injection instructions.  Please call me with any questions or concerns.   Repatha (evolocumab)   What is this medicine used for: Used to lower your LDL (bad cholesterol) The device is a single-dose disposable pen. This pen can only be used for 1 single injection, and must be discarded after use The medicine is injected under your skin and can be given by yourself or someone else (caregiver).             For more information and training videos: [email or web address]   Patient verbalizes understanding of instructions provided today and agrees to view in MyChart.   Next PCP appointment scheduled for: 09/26/20  Domenic Moras, PharmD Clinical Pharmacist [facility] [phone]

## 2020-09-07 NOTE — Chronic Care Management (AMB) (Signed)
Correction: Patient plans to take first injection of Repatha on 09/07/20  Domenic Moras, PharmD Clinical Pharmacist [facility] [phone]

## 2020-09-18 ENCOUNTER — Other Ambulatory Visit: Payer: Self-pay

## 2020-09-18 ENCOUNTER — Encounter: Payer: Self-pay | Admitting: Orthopaedic Surgery

## 2020-09-18 ENCOUNTER — Ambulatory Visit: Payer: Federal, State, Local not specified - PPO

## 2020-09-18 ENCOUNTER — Ambulatory Visit: Payer: Federal, State, Local not specified - PPO | Admitting: Orthopaedic Surgery

## 2020-09-18 VITALS — BP 149/70 | HR 57 | Ht 63.0 in | Wt 150.0 lb

## 2020-09-18 DIAGNOSIS — M25512 Pain in left shoulder: Secondary | ICD-10-CM | POA: Diagnosis not present

## 2020-09-18 DIAGNOSIS — G8929 Other chronic pain: Secondary | ICD-10-CM | POA: Diagnosis not present

## 2020-09-18 NOTE — Progress Notes (Signed)
My left shoulder hurts.  She has had left shoulder pain since April 2022 getting gradually worse.  She has limitations lifting her hand overhead and laying on it.  She has no trauma, no swelling, no redness, no numbness.  She has tried ice, heat, rubs, Tylenol with no help.  She is tired of hurting. She is taking ibuprofen 600 also.  Examination of left Upper Extremity is done.  Inspection:   Overall:  Elbow non-tender without crepitus or defects, forearm non-tender without crepitus or defects, wrist non-tender without crepitus or defects, hand non-tender.    Shoulder: with glenohumeral joint tenderness, without effusion.   Upper arm:  without swelling and tenderness   Range of motion:   Overall:  Full range of motion of the elbow, full range of motion of wrist and full range of motion in fingers.   Shoulder:  left  120 degrees forward flexion; 85 degrees abduction; 20 degrees internal rotation, 20 degrees external rotation, 5 degrees extension, 40 degrees adduction.   Stability:   Overall:  Shoulder, elbow and wrist stable   Strength and Tone:   Overall full shoulder muscles strength, full upper arm strength and normal upper arm bulk and tone.   Encounter Diagnosis  Name Primary?   Chronic left shoulder pain Yes   X-rays were done of the left shoulder, reported separately.  PROCEDURE NOTE:  The patient request injection, verbal consent was obtained.  The left shoulder was prepped appropriately after time out was performed.   Sterile technique was observed and injection of 1 cc of Celestone 6 mg with several cc's of plain xylocaine. Anesthesia was provided by ethyl chloride and a 20-gauge needle was used to inject the shoulder area. A posterior approach was used.  The injection was tolerated well.  A band aid dressing was applied.  The patient was advised to apply ice later today and tomorrow to the injection sight as needed.   Continue the ibuprofen.  Begin OT for the left  shoulder.  Return in three weeks.  Call if any problem.  Precautions discussed.  Electronically Signed Darreld Mclean, MD 8/30/20228:31 AM

## 2020-09-24 ENCOUNTER — Other Ambulatory Visit: Payer: Self-pay | Admitting: Family Medicine

## 2020-09-24 DIAGNOSIS — E119 Type 2 diabetes mellitus without complications: Secondary | ICD-10-CM

## 2020-09-26 ENCOUNTER — Ambulatory Visit: Payer: Federal, State, Local not specified - PPO | Admitting: Nurse Practitioner

## 2020-09-26 ENCOUNTER — Other Ambulatory Visit: Payer: Self-pay

## 2020-09-26 ENCOUNTER — Encounter: Payer: Self-pay | Admitting: Nurse Practitioner

## 2020-09-26 VITALS — BP 149/66 | HR 60 | Temp 98.4°F | Ht 63.0 in | Wt 151.0 lb

## 2020-09-26 DIAGNOSIS — I10 Essential (primary) hypertension: Secondary | ICD-10-CM

## 2020-09-26 DIAGNOSIS — E785 Hyperlipidemia, unspecified: Secondary | ICD-10-CM

## 2020-09-26 DIAGNOSIS — E119 Type 2 diabetes mellitus without complications: Secondary | ICD-10-CM | POA: Diagnosis not present

## 2020-09-26 MED ORDER — AMLODIPINE BESYLATE 5 MG PO TABS: 5.0000 mg | ORAL_TABLET | Freq: Every day | ORAL | 1 refills | Status: DC

## 2020-09-26 NOTE — Progress Notes (Signed)
Acute Office Visit  Subjective:    Patient ID: Sophia Robbins, female    DOB: [birth year], 60 y.o.   MRN: [id number]  Chief Complaint  Patient presents with   Hypertension    Follow up    Hypertension  Patient is in today for med check for HTN, HLD, and DM.  With DM, her goal LDL is < 70, and she was recently approved for Repatha.  For HTN, she was started on amlodipine at her last OV.  Past Medical History:  Diagnosis Date   Allergy    Asthma    Diabetes mellitus without complication ([place])    Hyperlipidemia    OSA on CPAP     Past Surgical History:  Procedure Laterality Date   APPENDECTOMY     BIOPSY  06/26/2020   Procedure: BIOPSY;  Surgeon: Harvel Quale, MD;  [facility];  Service: Gastroenterology;;   CESAREAN SECTION     COLONOSCOPY WITH PROPOFOL N/A 06/26/2020   Procedure: COLONOSCOPY WITH PROPOFOL;  Surgeon: Harvel Quale, MD;  Location: [facility];  Service: Gastroenterology;  Laterality: N/A;  9:00   ESOPHAGOGASTRODUODENOSCOPY (EGD) WITH PROPOFOL N/A 06/26/2020   Procedure: ESOPHAGOGASTRODUODENOSCOPY (EGD) WITH PROPOFOL;  Surgeon: Harvel Quale, MD;  Location: [facility];  Service: Gastroenterology;  Laterality: N/A;   EXPLORATION POST OPERATIVE OPEN HEART     KNEE ARTHROSCOPY AND ARTHROTOMY Left    TUBAL LIGATION      Family History  Problem Relation Age of Onset   Cancer Mother    Cancer Father    COPD Brother    Diabetes Sister     Social History   Socioeconomic History   Marital status: Married    Spouse name: Not on file   Number of children: 1   Years of education: Not on file   Highest education level: Some college, no degree  Occupational History   Occupation: Dietitian    Comment: [employer]  Tobacco Use   Smoking status: Former    Types: Cigarettes    Quit date: 06/20/2012    Years since quitting: 8.2   Smokeless tobacco: Never  Vaping Use   Vaping Use: Never used   Substance and Sexual Activity   Alcohol use: Yes    Alcohol/week: 1.0 standard drink    Types: 1 Glasses of wine per week    Comment: occasional   Drug use: Not Currently   Sexual activity: Yes    Birth control/protection: Surgical    Comment: tubal  Other Topics Concern   Not on file  Social History Narrative   Lives with husband of 65 years    1 daughter -granddaughter and grandson      Two cats-outside      Enjoys: crafts, Passenger transport manager, redoing home       Diet: eat all food groups, avoids milk -little intolerance   Caffeine: 1 cup coffee, unsweet iced tea   Water: 6 cups daily       Wears seat belt   Smoke detectors at home    Does not use phone while driving    Social Determinants of Health   Financial Resource Strain: Not on file  Food Insecurity: Not on file  Transportation Needs: Not on file  Physical Activity: Not on file  Stress: Not on file  Social Connections: Not on file  Intimate Partner Violence: Not on file    Outpatient Medications Prior to Visit  Medication Sig Dispense Refill  aspirin EC 81 MG tablet Take 81 mg by mouth daily.     cetirizine (ZYRTEC) 10 MG tablet Take 10 mg by mouth daily.     clobetasol ointment (TEMOVATE) 4.96 % Apply 1 application topically 2 (two) times daily as needed (Skin Rash).     Evolocumab (REPATHA SURECLICK) 759 MG/ML SOAJ Inject 140 mg into the skin every 14 (fourteen) days. 2 mL 11   ezetimibe (ZETIA) 10 MG tablet TAKE 1 TABLET BY MOUTH EVERY DAY 90 tablet 2   fluticasone (FLOVENT HFA) 110 MCG/ACT inhaler Inhale 2 puffs into the lungs daily as needed (Shortness of breath).     glucose blood test strip Use as instructed 100 each 12   levothyroxine (SYNTHROID) 25 MCG tablet TAKE 2 TABLETS (50 MCG TOTAL) BY MOUTH DAILY BEFORE BREAKFAST 180 tablet 0   losartan (COZAAR) 100 MG tablet TAKE 1 TABLET BY MOUTH EVERY DAY 90 tablet 2   metFORMIN (GLUCOPHAGE) 500 MG tablet TAKE 2 TABLETS BY MOUTH EVERY DAY WITH BREAKFAST 180 tablet 0    nystatin cream (MYCOSTATIN) Apply 1 application topically daily as needed for dry skin.     Semaglutide, 1 MG/DOSE, (OZEMPIC, 1 MG/DOSE,) 2 MG/1.5ML SOPN Inject 1 mg into the skin once a week. 18 mL 1   triamcinolone cream (KENALOG) 0.1 % Apply 1 application topically 2 (two) times daily as needed (Rash).     No facility-administered medications prior to visit.    Allergies  Allergen Reactions   Other Itching, Rash, Shortness Of Breath and Swelling   Codeine Hives   Shellfish Allergy Hives   Statins Hives    Blisters and peeling of skin    Review of Systems  Constitutional: Negative.   Respiratory: Negative.    Cardiovascular: Negative.   Psychiatric/Behavioral: Negative.        Objective:    Physical Exam Constitutional:      Appearance: Normal appearance.  Cardiovascular:     Rate and Rhythm: Normal rate and regular rhythm.     Pulses: Normal pulses.     Heart sounds: Normal heart sounds.  Pulmonary:     Effort: Pulmonary effort is normal.     Breath sounds: Normal breath sounds.  Neurological:     Mental Status: She is alert.  Psychiatric:        Mood and Affect: Mood normal.        Behavior: Behavior normal.        Thought Content: Thought content normal.        Judgment: Judgment normal.    BP (!) 149/66 (BP Location: Left Arm, Patient Position: Sitting, Cuff Size: Large)   Pulse 60   Temp 98.4 F (36.9 C) (Oral)   Ht '5\' 3"'  (1.6 m)   Wt 151 lb (68.5 kg)   SpO2 97%   BMI 26.75 kg/m  Wt Readings from Last 3 Encounters:  09/26/20 151 lb (68.5 kg)  09/18/20 150 lb (68 kg)  08/23/20 146 lb (66.2 kg)    There are no preventive care reminders to display for this patient.  There are no preventive care reminders to display for this patient.   Lab Results  Component Value Date   TSH 2.930 08/23/2020   Lab Results  Component Value Date   WBC 5.7 08/23/2020   HGB 13.6 08/23/2020   HCT 42.0 08/23/2020   MCV 89 08/23/2020   PLT 309 08/23/2020   Lab  Results  Component Value Date   NA 140 08/23/2020  K 5.4 (H) 08/23/2020   CO2 22 08/23/2020   GLUCOSE 111 (H) 08/23/2020   BUN 18 08/23/2020   CREATININE 0.77 08/23/2020   BILITOT 0.3 08/23/2020   ALKPHOS 113 08/23/2020   AST 16 08/23/2020   ALT 9 08/23/2020   PROT 7.3 08/23/2020   ALBUMIN 4.3 08/23/2020   CALCIUM 9.9 08/23/2020   EGFR 88 08/23/2020   Lab Results  Component Value Date   CHOL 192 08/23/2020   Lab Results  Component Value Date   HDL 55 08/23/2020   Lab Results  Component Value Date   LDLCALC 117 (H) 08/23/2020   Lab Results  Component Value Date   TRIG 113 08/23/2020   Lab Results  Component Value Date   CHOLHDL 4.4 10/27/2019   Lab Results  Component Value Date   HGBA1C 5.3 08/23/2020       Assessment & Plan:   Problem List Items Addressed This Visit       Cardiovascular and Mediastinum   Essential hypertension    BP Readings from Last 3 Encounters:  09/26/20 (!) 149/66  09/18/20 (!) 149/70  08/23/20 (!) 158/75  -looks like amlodipine rx didn't go through at the last OV -Rx. amlodipine      Relevant Medications   amLODipine (NORVASC) 5 MG tablet     Endocrine   Diabetes mellitus without complication (HCC)    -statin intolerant; was started on repatha -with DM, goal LDL < 70        Other   Hyperlipidemia LDL goal <70 - Primary    -ordered lipid panel -has had 2 repatha injections since last OV      Relevant Medications   amLODipine (NORVASC) 5 MG tablet   Other Relevant Orders   Lipid Panel With LDL/HDL Ratio     Meds ordered this encounter  Medications   amLODipine (NORVASC) 5 MG tablet    Sig: Take 1 tablet (5 mg total) by mouth daily.    Dispense:  90 tablet    Refill:  [place], NP

## 2020-09-26 NOTE — Patient Instructions (Signed)
Please have fasting labs drawn 2-3 days prior to your appointment so we can discuss the results during your office visit.  

## 2020-09-26 NOTE — Assessment & Plan Note (Signed)
BP Readings from Last 3 Encounters:  09/26/20 (!) 149/66  09/18/20 (!) 149/70  08/23/20 (!) 158/75   -looks like amlodipine rx didn't go through at the last OV -Rx. amlodipine

## 2020-09-26 NOTE — Assessment & Plan Note (Signed)
-  ordered lipid panel -has had 2 repatha injections since last OV

## 2020-09-26 NOTE — Assessment & Plan Note (Signed)
-  statin intolerant; was started on repatha -with DM, goal LDL < 70

## 2020-09-28 ENCOUNTER — Other Ambulatory Visit: Payer: Self-pay

## 2020-09-28 ENCOUNTER — Ambulatory Visit (HOSPITAL_COMMUNITY): Payer: Federal, State, Local not specified - PPO | Attending: Orthopaedic Surgery | Admitting: Occupational Therapy

## 2020-09-28 ENCOUNTER — Encounter (HOSPITAL_COMMUNITY): Payer: Self-pay | Admitting: Occupational Therapy

## 2020-09-28 DIAGNOSIS — M25512 Pain in left shoulder: Secondary | ICD-10-CM | POA: Insufficient documentation

## 2020-09-28 DIAGNOSIS — R29898 Other symptoms and signs involving the musculoskeletal system: Secondary | ICD-10-CM | POA: Insufficient documentation

## 2020-09-28 DIAGNOSIS — G8929 Other chronic pain: Secondary | ICD-10-CM | POA: Insufficient documentation

## 2020-09-28 DIAGNOSIS — M25612 Stiffness of left shoulder, not elsewhere classified: Secondary | ICD-10-CM | POA: Diagnosis not present

## 2020-09-28 NOTE — Patient Instructions (Signed)

## 2020-09-28 NOTE — Therapy (Signed)
[facility] [address] [place], [place], [address] Phone: [phone]   Fax:  [phone]  Occupational Therapy Evaluation  Patient Details  Name: Sophia Robbins MRN: [id number] Date of Birth: 1960-06-15 Referring Provider (OT): Dr. Darreld Mclean   Encounter Date: 09/28/2020   OT End of Session - 09/28/20 0846     Visit Number 1    Number of Visits 4    Date for OT Re-Evaluation 10/28/20    Authorization Type BC[employer], $25 copay    Authorization Time Period 75 visit limit PT/OT/SP, 0 used    Authorization - Visit Number 1    Authorization - Number of Visits 75    OT Start Time [phone]    OT Stop Time 0845    OT Time Calculation (min) 28 min    Activity Tolerance Patient tolerated treatment well    Behavior During Therapy [facility] for tasks assessed/performed             Past Medical History:  Diagnosis Date   Allergy    Asthma    Diabetes mellitus without complication (HCC)    Hyperlipidemia    OSA on CPAP     Past Surgical History:  Procedure Laterality Date   APPENDECTOMY     BIOPSY  06/26/2020   Procedure: BIOPSY;  Surgeon: Dolores Frame, MD;  [facility];  Service: Gastroenterology;;   CESAREAN SECTION     COLONOSCOPY WITH PROPOFOL N/A 06/26/2020   Procedure: COLONOSCOPY WITH PROPOFOL;  Surgeon: Dolores Frame, MD;  [facility];  Service: Gastroenterology;  Laterality: N/A;  9:00   ESOPHAGOGASTRODUODENOSCOPY (EGD) WITH PROPOFOL N/A 06/26/2020   Procedure: ESOPHAGOGASTRODUODENOSCOPY (EGD) WITH PROPOFOL;  Surgeon: Dolores Frame, MD;  [facility];  Service: Gastroenterology;  Laterality: N/A;   EXPLORATION POST OPERATIVE OPEN HEART     KNEE ARTHROSCOPY AND ARTHROTOMY Left    TUBAL LIGATION      There were no vitals filed for this visit.   Subjective Assessment - 09/28/20 0844     Subjective  S: It started hurting when I was on crutches back in May.     Pertinent History Pt is a 60 y/o female presenting with left shoulder pain that began after using 2 crutches for 2 weeks in May of 2022. Pt reports the right shoulder also hurts, however the left is worse. Pt received a cortisone shot however it only helped for a few days. Pt was referred to occupational therapy for evaluation and treatment by Dr. Darreld Mclean.    Special Tests FOTO: 56/100    Patient Stated Goals To be able to move my shoulder better.    Currently in Pain? No/denies               [facility] OT Assessment - 09/28/20 0816       Assessment   Medical Diagnosis left shoulder pain    Referring Provider (OT) Dr. 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 Sleeping is difficult, can only sleep on the back.      Written Expression   Dominant Hand Right      Cognition   Overall Cognitive Status Within Functional Limits for tasks assessed      Observation/Other Assessments   Focus on Therapeutic Outcomes (FOTO)  56/100      ROM / Strength   AROM / PROM / Strength AROM;PROM;Strength      Palpation   Palpation comment min fascial restrictions along left upper arm, trapezius, and scapular regions      AROM   Overall AROM Comments Assessed seated, er/IR adducted    AROM Assessment Site Shoulder    Right/Left Shoulder  Left    Left Shoulder Flexion 112 Degrees    Left Shoulder ABduction 118 Degrees    Left Shoulder Internal Rotation 90 Degrees    Left Shoulder External Rotation 53 Degrees      PROM   Overall PROM Comments Assessed supine, er/IR adducted    PROM Assessment Site Shoulder    Right/Left Shoulder Left    Left Shoulder Flexion 131 Degrees    Left Shoulder ABduction 145 Degrees    Left Shoulder Internal Rotation 90 Degrees    Left Shoulder External Rotation 75 Degrees      Strength   Overall Strength Comments Assessed seated, er/IR adducted    Strength Assessment Site Shoulder    Right/Left Shoulder Left    Left Shoulder Flexion 5/5    Left Shoulder ABduction 5/5    Left Shoulder Internal Rotation 5/5    Left Shoulder External Rotation 5/5                              OT Education - 09/28/20 0835     Education Details AA/ROM    Person(s) Educated Patient    Methods Explanation;Demonstration;Handout    Comprehension Verbalized understanding;Returned demonstration              OT Short Term Goals - 09/28/20 0850       OT SHORT TERM GOAL #1   Title Pt will be provided with and educated on HEP to improve mobility required for LUE use during ADLs and work tasks.    Time 4    Period Weeks    Status New    Target Date 10/28/20      OT SHORT TERM GOAL #2   Title Pt will decrease pain in LUE to 2/10 or less to improve ability to sleep on her side for 2+ hours without waking due to pain.    Time 4    Period Weeks    Status New      OT SHORT TERM GOAL #3   Title Pt will decrease LUE fascial restrictions to trace amounts to improve mobility required for functional reaching tasks.    Time 4    Period Weeks    Status New      OT SHORT TERM GOAL #4   Title Pt will increase LUE A/ROM to [facility] to improve ability to reach up and back to fix her hair.    Time 4  Period Weeks    Status New                      Plan - 09/28/20 0847      Clinical Impression Statement A: Pt is a 60 y/o female presenting with left shoulder pain limiting ability to complete ADLs and functional work tasks. Pt with decreased ROM and increased pain, strength is good. Educated on HEP for AA/ROM today. Pt is going to trial only 1x/week with updated HEPs between visits as pt works and is 40 mins from work.    OT Occupational Profile and History Problem Focused Assessment - Including review of records relating to presenting problem    Occupational performance deficits (Please refer to evaluation for details): ADL's;IADL's;Rest and Sleep;Work;Leisure    Body Structure / Function / Physical Skills ADL;Endurance;UE functional use;Fascial restriction;Pain;ROM;IADL;Strength    Rehab Potential Good    Clinical Decision Making Limited treatment options, no task modification necessary    Comorbidities Affecting Occupational Performance: None    Modification or Assistance to Complete Evaluation  No modification of tasks or assist necessary to complete eval    OT Frequency 1x / week    OT Duration 4 weeks    OT Treatment/Interventions Self-care/ADL training;Ultrasound;DME and/or AE instruction;Patient/family education;Passive range of motion;Cryotherapy;Electrical Stimulation;Moist Heat;Therapeutic exercise;Manual Therapy;Therapeutic activities    Plan P: Pt will benefit from skilled OT services to decrease pain and fascial restriction, increase ROM and functional use of LUE during ADLs and work tasks. Treatment plan: Myofascial release and manual techniques, P/ROM, A/ROM, general LUE strengthening, scapular mobility/stability/strengthening    OT Home Exercise Plan eval: shoulder AA/ROM    Consulted and Agree with Plan of Care Patient             Patient will benefit from skilled therapeutic intervention in order to improve the following deficits and impairments:   Body Structure / Function / Physical Skills: ADL, Endurance, UE functional use, Fascial restriction,  Pain, ROM, IADL, Strength       Visit Diagnosis: Chronic left shoulder pain  Stiffness of left shoulder, not elsewhere classified  Other symptoms and signs involving the musculoskeletal system    Problem List Patient Active Problem List   Diagnosis Date Noted   Left shoulder pain 08/23/2020   Eye exam, routine 10/27/2019   Vitamin D deficiency 10/27/2019   Hypothyroidism 05/25/2019   OSA (obstructive sleep apnea) 04/14/2019   Essential hypertension 04/14/2019   Hyperlipidemia LDL goal <70 04/14/2019   Diabetes mellitus without complication (HCC) 04/14/2019   Annual visit for general adult medical examination with abnormal findings 04/14/2019    Ezra Sites, OTR/L  [phone] 09/28/2020, 8:52 AM  [facility] [address] [place], [place], [address] Phone: [phone]   Fax:  [phone]  Name: Sophia Robbins MRN: [id number] Date of Birth: 23-Jun-1960

## 2020-10-09 ENCOUNTER — Ambulatory Visit: Payer: Federal, State, Local not specified - PPO | Admitting: Orthopaedic Surgery

## 2020-10-12 ENCOUNTER — Other Ambulatory Visit: Payer: Self-pay

## 2020-10-12 ENCOUNTER — Ambulatory Visit (HOSPITAL_COMMUNITY): Payer: Federal, State, Local not specified - PPO | Admitting: Occupational Therapy

## 2020-10-12 ENCOUNTER — Encounter (HOSPITAL_COMMUNITY): Payer: Self-pay | Admitting: Occupational Therapy

## 2020-10-12 DIAGNOSIS — M25512 Pain in left shoulder: Secondary | ICD-10-CM | POA: Diagnosis not present

## 2020-10-12 DIAGNOSIS — R29898 Other symptoms and signs involving the musculoskeletal system: Secondary | ICD-10-CM | POA: Diagnosis not present

## 2020-10-12 DIAGNOSIS — M25612 Stiffness of left shoulder, not elsewhere classified: Secondary | ICD-10-CM

## 2020-10-12 DIAGNOSIS — G8929 Other chronic pain: Secondary | ICD-10-CM | POA: Diagnosis not present

## 2020-10-12 NOTE — Patient Instructions (Signed)
  1) Flexion Wall Stretch    Face wall, place affected handon wall in front of you. Slide hand up the wall  and lean body in towards the wall. Hold for 10 seconds. Repeat 3-5 times. 1-2 times/day.     2) Towel Stretch with Internal Rotation      Gently pull up (or to the side) your affected arm  behind your back with the assist of a towel. Hold 10 seconds, repeat 3-5 times. 1-2 times/day.             3) Corner Stretch    Stand at a corner of a wall, place your arms on the walls with elbows bent. Lean into the corner until a stretch is felt along the front of your chest and/or shoulders. Hold for 10 seconds. Repeat 3-5X, 1-2 times/day.    4) Posterior Capsule Stretch    Bring the involved arm across chest. Grasp elbow and pull toward chest until you feel a stretch in the back of the upper arm and shoulder. Hold 10 seconds. Repeat 3-5X. Complete 1-2 times/day.    5) Abduction Stretch    While standing next to wall, place your affected arm as pictured, then lean towards wall to stretch shoulder.  Hold 10 seconds, repeat 3-5X. Complete 1-2 times/day

## 2020-10-12 NOTE — Therapy (Signed)
[facility] [address] [place], [place], [address] Phone: [phone]   Fax:  [phone]  Occupational Therapy Treatment  Patient Details  Name: Sophia Robbins MRN: [id number] Date of Birth: 27-Aug-1960 Referring Provider (OT): Dr. Darreld Mclean   Encounter Date: 10/12/2020   OT End of Session - 10/12/20 0853     Visit Number 2    Number of Visits 4    Date for OT Re-Evaluation 10/28/20    Authorization Type BC[employer], $25 copay    Authorization Time Period 75 visit limit PT/OT/SP, 0 used    Authorization - Visit Number 2    Authorization - Number of Visits 75    OT Start Time [phone]    OT Stop Time 0854    OT Time Calculation (min) 38 min    Activity Tolerance Patient tolerated treatment well    Behavior During Therapy [facility] for tasks assessed/performed             Past Medical History:  Diagnosis Date   Allergy    Asthma    Diabetes mellitus without complication (HCC)    Hyperlipidemia    OSA on CPAP     Past Surgical History:  Procedure Laterality Date   APPENDECTOMY     BIOPSY  06/26/2020   Procedure: BIOPSY;  Surgeon: Dolores Frame, MD;  [facility];  Service: Gastroenterology;;   CESAREAN SECTION     COLONOSCOPY WITH PROPOFOL N/A 06/26/2020   Procedure: COLONOSCOPY WITH PROPOFOL;  Surgeon: Dolores Frame, MD;  [facility];  Service: Gastroenterology;  Laterality: N/A;  9:00   ESOPHAGOGASTRODUODENOSCOPY (EGD) WITH PROPOFOL N/A 06/26/2020   Procedure: ESOPHAGOGASTRODUODENOSCOPY (EGD) WITH PROPOFOL;  Surgeon: Dolores Frame, MD;  [facility];  Service: Gastroenterology;  Laterality: N/A;   EXPLORATION POST OPERATIVE OPEN HEART     KNEE ARTHROSCOPY AND ARTHROTOMY Left    TUBAL LIGATION      There were no vitals filed for this visit.   Subjective Assessment - 10/12/20 0816     Subjective  S:It's been doing ok.    Currently in Pain? Yes    Pain Score 5      Pain Location Shoulder    Pain Orientation Left    Pain Descriptors / Indicators Sore    Pain Type Chronic pain    Pain Radiating Towards none    Pain Onset More than a month ago    Pain Frequency Constant    Aggravating Factors  use    Pain Relieving Factors unsure    Effect of Pain on Daily Activities mod effect on ADLs    Multiple Pain Sites No                [facility] OT Assessment - 10/12/20 0816       Assessment   Medical Diagnosis left shoulder pain                      OT Treatments/Exercises (OP) - 10/12/20 0818       Exercises   Exercises Shoulder      Shoulder Exercises: Supine   Protraction PROM;5 reps;AROM;10 reps    Horizontal ABduction PROM;5 reps;AAROM;10 reps    External Rotation PROM;5 reps;AROM;10 reps    Internal Rotation PROM;5 reps;AROM;10 reps    Flexion PROM;5 reps;AROM;10 reps    ABduction PROM;5 reps;AAROM;10 reps      Shoulder Exercises: Standing   Protraction AROM;10 reps  Horizontal ABduction AROM;10 reps    External Rotation AROM;10 reps    Internal Rotation AROM;10 reps    Flexion AROM;10 reps    ABduction AROM;10 reps      Shoulder Exercises: ROM/Strengthening   UBE (Upper Arm Bike) level 1 2' forward 2' reverse, pace: 4.0    Proximal Shoulder Strengthening, Supine 10X each, no rest breaks      Shoulder Exercises: Stretch   Corner Stretch 3 reps;10 seconds    Cross Chest Stretch 3 reps;10 seconds    Internal Rotation Stretch 3 reps   10" holds, horizontal towel.   Wall Stretch - Flexion 3 reps;10 seconds    Wall Stretch - ABduction 3 reps;10 seconds      Manual Therapy   Manual Therapy Myofascial release    Manual therapy comments completed separately from therapeutic exercises    Myofascial Release myofascial release and manual techniques to left upper arm, anterior shoulder, and trapezius regions to decrease pain and fascial restrictions and increase joint ROM                    OT Education -  10/12/20 0840     Education Details shoulder stretches    Person(s) Educated Patient    Methods Explanation;Demonstration;Handout    Comprehension Verbalized understanding;Returned demonstration              OT Short Term Goals - 10/12/20 0854       OT SHORT TERM GOAL #1   Title Pt will be provided with and educated on HEP to improve mobility required for LUE use during ADLs and work tasks.    Time 4    Period Weeks    Status On-going    Target Date 10/28/20      OT SHORT TERM GOAL #2   Title Pt will decrease pain in LUE to 2/10 or less to improve ability to sleep on her side for 2+ hours without waking due to pain.    Time 4    Period Weeks    Status On-going      OT SHORT TERM GOAL #3   Title Pt will decrease LUE fascial restrictions to trace amounts to improve mobility required for functional reaching tasks.    Time 4    Period Weeks    Status On-going      OT SHORT TERM GOAL #4   Title Pt will increase LUE A/ROM to [facility] to improve ability to reach up and back to fix her hair.    Time 4    Period Weeks    Status On-going                      Plan - 10/12/20 0160     Clinical Impression Statement A: Pt reports HEP is going well. Initiated myofascial release and manual techniques to address fascial restrictions, good response to trigger point release at trapezius region. Pt completing A/ROM in supine and standing, shoulder stretches. ROM is WFL during tasks, slight increase in pain with exercises. Verbal cuing for form and technique.    Body Structure / Function / Physical Skills ADL;Endurance;UE functional use;Fascial restriction;Pain;ROM;IADL;Strength    Plan P: Follow up on HEP, continue with A/ROM and add to HEP, add scapular theraband    OT Home Exercise Plan eval: shoulder AA/ROM; 9/23: shoulder stretches    Consulted and Agree with Plan of Care Patient  Patient will benefit from skilled therapeutic intervention in order to improve  the following deficits and impairments:   Body Structure / Function / Physical Skills: ADL, Endurance, UE functional use, Fascial restriction, Pain, ROM, IADL, Strength       Visit Diagnosis: Chronic left shoulder pain  Stiffness of left shoulder, not elsewhere classified  Other symptoms and signs involving the musculoskeletal system    Problem List Patient Active Problem List   Diagnosis Date Noted   Left shoulder pain 08/23/2020   Eye exam, routine 10/27/2019   Vitamin D deficiency 10/27/2019   Hypothyroidism 05/25/2019   OSA (obstructive sleep apnea) 04/14/2019   Essential hypertension 04/14/2019   Hyperlipidemia LDL goal <70 04/14/2019   Diabetes mellitus without complication (HCC) 04/14/2019   Annual visit for general adult medical examination with abnormal findings 04/14/2019   Ezra Sites, OTR/L  [phone] 10/12/2020, 8:55 AM  [facility] [address] [place], [place], [address] Phone: [phone]   Fax:  [phone]  Name: Sophia Robbins MRN: [id number] Date of Birth: 12-28-1960

## 2020-10-16 ENCOUNTER — Ambulatory Visit (HOSPITAL_COMMUNITY): Payer: Federal, State, Local not specified - PPO | Admitting: Occupational Therapy

## 2020-10-16 ENCOUNTER — Other Ambulatory Visit: Payer: Self-pay

## 2020-10-16 ENCOUNTER — Encounter (HOSPITAL_COMMUNITY): Payer: Self-pay | Admitting: Occupational Therapy

## 2020-10-16 DIAGNOSIS — M25612 Stiffness of left shoulder, not elsewhere classified: Secondary | ICD-10-CM

## 2020-10-16 DIAGNOSIS — G8929 Other chronic pain: Secondary | ICD-10-CM | POA: Diagnosis not present

## 2020-10-16 DIAGNOSIS — M25512 Pain in left shoulder: Secondary | ICD-10-CM

## 2020-10-16 DIAGNOSIS — R29898 Other symptoms and signs involving the musculoskeletal system: Secondary | ICD-10-CM

## 2020-10-16 NOTE — Therapy (Signed)
[facility] [address] [place], [place], [address] Phone: [phone]   Fax:  [phone]  Occupational Therapy Treatment  Patient Details  Name: Sophia Robbins MRN: [id number] Date of Birth: 04-21-1960 Referring Provider (OT): Dr. Darreld Mclean   Encounter Date: 10/16/2020   OT End of Session - 10/16/20 0808     Visit Number 3    Number of Visits 4    Date for OT Re-Evaluation 10/28/20    Authorization Type [employer], $25 copay    Authorization Time Period 75 visit limit PT/OT/SP, 0 used    Authorization - Visit Number 3    Authorization - Number of Visits 75    OT Start Time 0734    OT Stop Time 0812    OT Time Calculation (min) 38 min    Activity Tolerance Patient tolerated treatment well    Behavior During Therapy [facility] for tasks assessed/performed             Past Medical History:  Diagnosis Date   Allergy    Asthma    Diabetes mellitus without complication (HCC)    Hyperlipidemia    OSA on CPAP     Past Surgical History:  Procedure Laterality Date   APPENDECTOMY     BIOPSY  06/26/2020   Procedure: BIOPSY;  Surgeon: Dolores Frame, MD;  [facility];  Service: Gastroenterology;;   CESAREAN SECTION     COLONOSCOPY WITH PROPOFOL N/A 06/26/2020   Procedure: COLONOSCOPY WITH PROPOFOL;  Surgeon: Dolores Frame, MD;  [facility];  Service: Gastroenterology;  Laterality: N/A;  9:00   ESOPHAGOGASTRODUODENOSCOPY (EGD) WITH PROPOFOL N/A 06/26/2020   Procedure: ESOPHAGOGASTRODUODENOSCOPY (EGD) WITH PROPOFOL;  Surgeon: Dolores Frame, MD;  [facility];  Service: Gastroenterology;  Laterality: N/A;   EXPLORATION POST OPERATIVE OPEN HEART     KNEE ARTHROSCOPY AND ARTHROTOMY Left    TUBAL LIGATION      There were no vitals filed for this visit.   Subjective Assessment - 10/16/20 0736     Subjective  S: It's really sore from reaching a lot at work yesterday.     Currently in Pain? Yes    Pain Score 4     Pain Location Shoulder    Pain Orientation Left    Pain Descriptors / Indicators Sore    Pain Type Chronic pain    Pain Radiating Towards none    Pain Onset More than a month ago    Pain Frequency Constant    Aggravating Factors  use    Pain Relieving Factors unsure    Effect of Pain on Daily Activities mod effect on ADLs    Multiple Pain Sites No                [facility] OT Assessment - 10/16/20 0736       Assessment   Medical Diagnosis left shoulder pain      Precautions   Precautions None                      OT Treatments/Exercises (OP) - 10/16/20 0737       Exercises   Exercises Shoulder      Shoulder Exercises: Supine   Protraction PROM;5 reps;AROM;10 reps    Horizontal ABduction PROM;5 reps;AROM;10 reps    External Rotation PROM;5 reps;AROM;10 reps    Internal Rotation PROM;5 reps;AROM;10 reps    Flexion PROM;5 reps;AROM;10 reps    ABduction  PROM;5 reps;AROM;10 reps      Shoulder Exercises: Standing   Protraction AROM;10 reps    Horizontal ABduction AROM;10 reps    External Rotation AROM;10 reps    Internal Rotation AROM;10 reps    Flexion AROM;10 reps    ABduction AROM;10 reps    Extension Theraband;10 reps    Theraband Level (Shoulder Extension) Level 2 (Red)    Row Theraband;10 reps    Theraband Level (Shoulder Row) Level 2 (Red)    Retraction Theraband;10 reps    Theraband Level (Shoulder Retraction) Level 2 (Red)      Shoulder Exercises: ROM/Strengthening   UBE (Upper Arm Bike) Level 1 3' forward 3' reverse pace: 4.0    Proximal Shoulder Strengthening, Supine 10X each, no rest breaks    Other ROM/Strengthening Exercises ball pass behind back for IR, 10X with tennis ball      Shoulder Exercises: Stretch   Corner Stretch 2 reps;10 seconds    Cross Chest Stretch 2 reps;10 seconds    Internal Rotation Stretch 2 reps   10" holds, horizontal towel   Wall Stretch - Flexion 2 reps;10 seconds     Wall Stretch - ABduction 2 reps;10 seconds      Manual Therapy   Manual Therapy Myofascial release    Manual therapy comments completed separately from therapeutic exercises    Myofascial Release myofascial release and manual techniques to left upper arm, anterior shoulder, and trapezius regions to decrease pain and fascial restrictions and increase joint ROM                    OT Education - 10/16/20 0759     Education Details shoulder A/ROM    Person(s) Educated Patient    Methods Explanation;Demonstration;Handout    Comprehension Verbalized understanding;Returned demonstration              OT Short Term Goals - 10/12/20 0854       OT SHORT TERM GOAL #1   Title Pt will be provided with and educated on HEP to improve mobility required for LUE use during ADLs and work tasks.    Time 4    Period Weeks    Status On-going    Target Date 10/28/20      OT SHORT TERM GOAL #2   Title Pt will decrease pain in LUE to 2/10 or less to improve ability to sleep on her side for 2+ hours without waking due to pain.    Time 4    Period Weeks    Status On-going      OT SHORT TERM GOAL #3   Title Pt will decrease LUE fascial restrictions to trace amounts to improve mobility required for functional reaching tasks.    Time 4    Period Weeks    Status On-going      OT SHORT TERM GOAL #4   Title Pt will increase LUE A/ROM to [facility] to improve ability to reach up and back to fix her hair.    Time 4    Period Weeks    Status On-going                      Plan - 10/16/20 0755     Clinical Impression Statement A: Pt reports soreness from reaching tasks yesterday, also had one instance of arm going numb after previous therapy session. Continued with myofascial release to address fascial restrictions, A/ROM in supine and shoulder stretches in standing. Added ball  pass for IR, pt reports IR behind back is the most painful stretch. Also added scapular theraband exercises  and continued with UBE. Verbal cuing for form and technique.    Body Structure / Function / Physical Skills ADL;Endurance;UE functional use;Fascial restriction;Pain;ROM;IADL;Strength    Plan P: Follow up on HEP, reassessment    OT Home Exercise Plan eval: shoulder AA/ROM; 9/23: shoulder stretches; 9/27: A/ROM    Consulted and Agree with Plan of Care Patient             Patient will benefit from skilled therapeutic intervention in order to improve the following deficits and impairments:   Body Structure / Function / Physical Skills: ADL, Endurance, UE functional use, Fascial restriction, Pain, ROM, IADL, Strength       Visit Diagnosis: Chronic left shoulder pain  Stiffness of left shoulder, not elsewhere classified  Other symptoms and signs involving the musculoskeletal system    Problem List Patient Active Problem List   Diagnosis Date Noted   Left shoulder pain 08/23/2020   Eye exam, routine 10/27/2019   Vitamin D deficiency 10/27/2019   Hypothyroidism 05/25/2019   OSA (obstructive sleep apnea) 04/14/2019   Essential hypertension 04/14/2019   Hyperlipidemia LDL goal <70 04/14/2019   Diabetes mellitus without complication (HCC) 04/14/2019   Annual visit for general adult medical examination with abnormal findings 04/14/2019    Ezra Sites, OTR/L  [phone] 10/16/2020, 8:12 AM  [facility] [address] [place], [place], [address] Phone: [phone]   Fax:  [phone]  Name: Sophia Robbins MRN: [id number] Date of Birth: 28-Jan-1960

## 2020-10-16 NOTE — Patient Instructions (Signed)

## 2020-10-24 ENCOUNTER — Other Ambulatory Visit: Payer: Self-pay | Admitting: Nurse Practitioner

## 2020-10-24 ENCOUNTER — Encounter (HOSPITAL_COMMUNITY): Payer: Self-pay | Admitting: Occupational Therapy

## 2020-10-24 ENCOUNTER — Ambulatory Visit (HOSPITAL_COMMUNITY): Payer: Federal, State, Local not specified - PPO | Attending: Orthopaedic Surgery | Admitting: Occupational Therapy

## 2020-10-24 ENCOUNTER — Other Ambulatory Visit: Payer: Self-pay

## 2020-10-24 DIAGNOSIS — M25512 Pain in left shoulder: Secondary | ICD-10-CM | POA: Insufficient documentation

## 2020-10-24 DIAGNOSIS — G8929 Other chronic pain: Secondary | ICD-10-CM | POA: Insufficient documentation

## 2020-10-24 DIAGNOSIS — R29898 Other symptoms and signs involving the musculoskeletal system: Secondary | ICD-10-CM | POA: Insufficient documentation

## 2020-10-24 DIAGNOSIS — M25612 Stiffness of left shoulder, not elsewhere classified: Secondary | ICD-10-CM | POA: Diagnosis not present

## 2020-10-24 NOTE — Patient Instructions (Signed)

## 2020-10-24 NOTE — Therapy (Signed)
[facility] [address] [place], [place], [address] Phone: [phone]   Fax:  [phone]  Occupational Therapy Treatment  Patient Details  Name: Sophia Robbins MRN: [id number] Date of Birth: 22-May-1960 Referring Provider (OT): Dr. Sanjuana Kava   Encounter Date: 10/24/2020   OT End of Session - 10/24/20 0804     Visit Number 4    Number of Visits 4    Date for OT Re-Evaluation 10/28/20    Authorization Type [employer], $25 copay    Authorization Time Period 75 visit limit PT/OT/SP, 0 used    Authorization - Visit Number 4    Authorization - Number of Visits 67    OT Start Time 2585    OT Stop Time 0804    OT Time Calculation (min) 27 min    Activity Tolerance Patient tolerated treatment well    Behavior During Therapy [facility] for tasks assessed/performed             Past Medical History:  Diagnosis Date   Allergy    Asthma    Diabetes mellitus without complication ([place])    Hyperlipidemia    OSA on CPAP     Past Surgical History:  Procedure Laterality Date   APPENDECTOMY     BIOPSY  06/26/2020   Procedure: BIOPSY;  Surgeon: Harvel Quale, MD;  [facility];  Service: Gastroenterology;;   CESAREAN SECTION     COLONOSCOPY WITH PROPOFOL N/A 06/26/2020   Procedure: COLONOSCOPY WITH PROPOFOL;  Surgeon: Harvel Quale, MD;  [facility];  Service: Gastroenterology;  Laterality: N/A;  9:00   ESOPHAGOGASTRODUODENOSCOPY (EGD) WITH PROPOFOL N/A 06/26/2020   Procedure: ESOPHAGOGASTRODUODENOSCOPY (EGD) WITH PROPOFOL;  Surgeon: Harvel Quale, MD;  [facility];  Service: Gastroenterology;  Laterality: N/A;   EXPLORATION POST OPERATIVE OPEN HEART     KNEE ARTHROSCOPY AND ARTHROTOMY Left    TUBAL LIGATION      There were no vitals filed for this visit.   Subjective Assessment - 10/24/20 0738     Subjective  S: It's the most sore when reaching behind my back.    Currently in  Pain? Yes    Pain Score 2     Pain Location Shoulder    Pain Orientation Left    Pain Descriptors / Indicators Sore    Pain Type Chronic pain    Pain Radiating Towards none    Pain Onset More than a month ago    Pain Frequency Constant    Aggravating Factors  use    Pain Relieving Factors unsure    Effect of Pain on Daily Activities mod effect on ADLs    Multiple Pain Sites No                [facility] OT Assessment - 10/24/20 0738       Assessment   Medical Diagnosis left shoulder pain      Precautions   Precautions None      Observation/Other Assessments   Focus on Therapeutic Outcomes (FOTO)  73/100   56/100 previous     Palpation   Palpation comment trace fascial restrictions along left trapezius      AROM   Overall AROM Comments Assessed seated, er/IR adducted    AROM Assessment Site Shoulder    Right/Left Shoulder Left    Left Shoulder Flexion 145 Degrees   112 previous   Left Shoulder ABduction 140 Degrees   118 previous  Left Shoulder Internal Rotation 90 Degrees   same as previous   Left Shoulder External Rotation 60 Degrees   53 previous     PROM   Overall PROM Comments Assessed supine, er/IR adducted    PROM Assessment Site Shoulder    Right/Left Shoulder Left    Left Shoulder Flexion 146 Degrees   131 previous   Left Shoulder ABduction 155 Degrees   145 previous   Left Shoulder Internal Rotation 90 Degrees   same as previous   Left Shoulder External Rotation 75 Degrees   same as previous     Strength   Overall Strength Comments Assessed seated, er/IR adducted    Strength Assessment Site Shoulder    Right/Left Shoulder Left    Left Shoulder Flexion 5/5   same as previous   Left Shoulder ABduction 5/5   same as previous   Left Shoulder Internal Rotation 5/5   same as previous   Left Shoulder External Rotation 5/5   same as previous                     OT Treatments/Exercises (OP) - 10/24/20 0738       Exercises   Exercises  Shoulder      Shoulder Exercises: Standing   Protraction Strengthening;10 reps    Protraction Weight (lbs) 1    Horizontal ABduction Strengthening;10 reps    Horizontal ABduction Weight (lbs) 1    External Rotation Strengthening;10 reps    External Rotation Weight (lbs) 1    Internal Rotation Strengthening;10 reps    Internal Rotation Weight (lbs) 1    Flexion Strengthening;10 reps    Shoulder Flexion Weight (lbs) 1    ABduction Strengthening;10 reps    Shoulder ABduction Weight (lbs) 1    Extension Theraband;10 reps    Theraband Level (Shoulder Extension) Level 2 (Red)    Row Theraband;10 reps    Theraband Level (Shoulder Row) Level 2 (Red)    Retraction Theraband;10 reps    Theraband Level (Shoulder Retraction) Level 2 (Red)      Shoulder Exercises: ROM/Strengthening   Other ROM/Strengthening Exercises ball pass behind back for IR, 10X with tennis ball      Shoulder Exercises: Stretch   Internal Rotation Stretch 2 reps   20" holds horizontal towel                   OT Education - 10/24/20 0756     Education Details red scapular theraband    Person(s) Educated Patient    Methods Explanation;Demonstration;Handout    Comprehension Verbalized understanding;Returned demonstration              OT Short Term Goals - 10/24/20 0751       OT SHORT TERM GOAL #1   Title Pt will be provided with and educated on HEP to improve mobility required for LUE use during ADLs and work tasks.    Time 4    Period Weeks    Status Achieved    Target Date 10/28/20      OT SHORT TERM GOAL #2   Title Pt will decrease pain in LUE to 2/10 or less to improve ability to sleep on her side for 2+ hours without waking due to pain.    Time 4    Period Weeks    Status Not Met      OT SHORT TERM GOAL #3   Title Pt will decrease LUE fascial restrictions to trace amounts to  improve mobility required for functional reaching tasks.    Time 4    Period Weeks    Status Achieved       OT SHORT TERM GOAL #4   Title Pt will increase LUE A/ROM to [facility] to improve ability to reach up and back to fix her hair.    Time 4    Period Weeks    Status Achieved                      Plan - 10/24/20 0804     Clinical Impression Statement A: Reassessment completed this session, pt reports she is able to lift her arm more now and is completing ADLs and work tasks with little difficulty. Continues to have pain when trying to sleep on her left side and when reaching behind her back, some mild soreness with strengthening tasks. Pt has met 3/4 goals and is now demonstrating ROM WFL. Pt educated on HEP and activities to continue working on IR, recommended trying to avoid sleeping on the left side. Pt is agreeable to discharge today with HEP.    Body Structure / Function / Physical Skills ADL;Endurance;UE functional use;Fascial restriction;Pain;ROM;IADL;Strength    Plan P: Discharge pt    OT Home Exercise Plan eval: shoulder AA/ROM; 9/23: shoulder stretches; 9/27: A/ROM; 10/5: red scapular theraband, updated A/ROM to strengthening with low weight    Consulted and Agree with Plan of Care Patient             Patient will benefit from skilled therapeutic intervention in order to improve the following deficits and impairments:   Body Structure / Function / Physical Skills: ADL, Endurance, UE functional use, Fascial restriction, Pain, ROM, IADL, Strength       Visit Diagnosis: Chronic left shoulder pain  Stiffness of left shoulder, not elsewhere classified  Other symptoms and signs involving the musculoskeletal system    Problem List Patient Active Problem List   Diagnosis Date Noted   Left shoulder pain 08/23/2020   Eye exam, routine 10/27/2019   Vitamin D deficiency 10/27/2019   Hypothyroidism 05/25/2019   OSA (obstructive sleep apnea) 04/14/2019   Essential hypertension 04/14/2019   Hyperlipidemia LDL goal <70 04/14/2019   Diabetes mellitus without complication  ([place]) 49/70/2637   Annual visit for general adult medical examination with abnormal findings 04/14/2019    Guadelupe Sabin, OTR/L  [phone] 10/24/2020, 8:07 AM  [facility] [place], [place], [address] Phone: [phone]   Fax:  [phone]  Name: Sophia Robbins MRN: [id number] Date of Birth: 19-Sep-1960  OCCUPATIONAL THERAPY DISCHARGE SUMMARY  Visits from Start of Care: 4  Current functional level related to goals / functional outcomes: See above. Pt has met 3/4 goals and is using the LUE as non-dominant during ADLs and work tasks.    Remaining deficits: Continued pain when trying to sleep on the left side, difficulty with IR behind back   Education / Equipment: HEP for stretching, scapular theraband, and strengthening   Patient agrees to discharge. Patient goals were met. Patient is being discharged due to meeting the stated rehab goals.Marland Kitchen

## 2020-10-24 NOTE — Progress Notes (Signed)
PATIENT: Sophia Robbins DOB: 1960/07/08  REASON FOR VISIT: follow up HISTORY FROM: patient   HISTORY OF PRESENT ILLNESS: Today 10/25/20:  Sophia Robbins is a 60 year old female with a history of obstructive sleep apnea on CPAP.  She reports that the CPAP continues to work well for her.  She does state that she needs new supplies.  She returns today for an evaluation.      HISTORY  08/11/19:   Sophia Robbins is a 60 year old female with a history of obstructive sleep apnea on CPAP.  She returns today for follow-up.  She received a new machine.  Her download indicates that she use her machine 29 out of 30 days for compliance of 97%.  She use her machine greater than 4 hours each night.  On average she uses her machine 8 hours and 8 minutes.  Her residual AHI is 1.9 on 6 to 12 cm of water with EPR of 3.  Leak in the 95th percentile is 22.2 L/min.  REVIEW OF SYSTEMS: Out of a complete 14 system review of symptoms, the patient complains only of the following symptoms, and all other reviewed systems are negative.  FSS ESS  ALLERGIES: Allergies  Allergen Reactions   Other Itching, Rash, Shortness Of Breath and Swelling   Codeine Hives   Shellfish Allergy Hives   Statins Hives    Blisters and peeling of skin    HOME MEDICATIONS: Outpatient Medications Prior to Visit  Medication Sig Dispense Refill   amLODipine (NORVASC) 5 MG tablet Take 1 tablet (5 mg total) by mouth daily. 90 tablet 1   aspirin EC 81 MG tablet Take 81 mg by mouth daily.     cetirizine (ZYRTEC) 10 MG tablet Take 10 mg by mouth daily.     clobetasol ointment (TEMOVATE) 0.05 % Apply 1 application topically 2 (two) times daily as needed (Skin Rash).     Evolocumab (REPATHA SURECLICK) 140 MG/ML SOAJ Inject 140 mg into the skin every 14 (fourteen) days. 2 mL 11   ezetimibe (ZETIA) 10 MG tablet TAKE 1 TABLET BY MOUTH EVERY DAY 90 tablet 2   fluticasone (FLOVENT HFA) 110 MCG/ACT inhaler Inhale 2 puffs into the lungs  daily as needed (Shortness of breath).     glucose blood test strip Use as instructed 100 each 12   levothyroxine (SYNTHROID) 25 MCG tablet TAKE 2 TABLETS (50 MCG TOTAL) BY MOUTH DAILY BEFORE BREAKFAST 180 tablet 0   losartan (COZAAR) 100 MG tablet TAKE 1 TABLET BY MOUTH EVERY DAY 90 tablet 2   metFORMIN (GLUCOPHAGE) 500 MG tablet TAKE 2 TABLETS BY MOUTH EVERY DAY WITH BREAKFAST 180 tablet 0   nystatin cream (MYCOSTATIN) Apply 1 application topically daily as needed for dry skin.     Semaglutide, 1 MG/DOSE, (OZEMPIC, 1 MG/DOSE,) 2 MG/1.5ML SOPN Inject 1 mg into the skin once a week. 18 mL 1   triamcinolone cream (KENALOG) 0.1 % Apply 1 application topically 2 (two) times daily as needed (Rash).     No facility-administered medications prior to visit.    PAST MEDICAL HISTORY: Past Medical History:  Diagnosis Date   Allergy    Asthma    Diabetes mellitus without complication (HCC)    Hyperlipidemia    OSA on CPAP     PAST SURGICAL HISTORY: Past Surgical History:  Procedure Laterality Date   APPENDECTOMY     BIOPSY  06/26/2020   Procedure: BIOPSY;  Surgeon: Dolores Frame, MD;  [facility];  Service: Gastroenterology;;   CESAREAN SECTION     COLONOSCOPY WITH PROPOFOL N/A 06/26/2020   Procedure: COLONOSCOPY WITH PROPOFOL;  Surgeon: Dolores Frame, MD;  [facility];  Service: Gastroenterology;  Laterality: N/A;  9:00   ESOPHAGOGASTRODUODENOSCOPY (EGD) WITH PROPOFOL N/A 06/26/2020   Procedure: ESOPHAGOGASTRODUODENOSCOPY (EGD) WITH PROPOFOL;  Surgeon: Dolores Frame, MD;  [facility];  Service: Gastroenterology;  Laterality: N/A;   EXPLORATION POST OPERATIVE OPEN HEART     KNEE ARTHROSCOPY AND ARTHROTOMY Left    TUBAL LIGATION      FAMILY HISTORY: Family History  Problem Relation Age of Onset   Cancer Mother    Cancer Father    Diabetes Sister    Sleep apnea Sister    COPD Brother    Sleep apnea Brother      SOCIAL HISTORY: Social History   Socioeconomic History   Marital status: Married    Spouse name: Not on file   Number of children: 1   Years of education: Not on file   Highest education level: Some college, no degree  Occupational History   Occupation: Public relations account executive    Comment: [employer]  Tobacco Use   Smoking status: Former    Types: Cigarettes    Quit date: 06/20/2012    Years since quitting: 8.3   Smokeless tobacco: Never  Vaping Use   Vaping Use: Never used  Substance and Sexual Activity   Alcohol use: Yes    Alcohol/week: 3.0 standard drinks    Types: 3 Glasses of wine per week    Comment: occasional   Drug use: Not Currently   Sexual activity: Yes    Birth control/protection: Surgical    Comment: tubal  Other Topics Concern   Not on file  Social History Narrative   Lives with husband of 37 years    1 daughter -granddaughter and grandson      Two cats-outside      Enjoys: crafts, Web designer, redoing home       Diet: eat all food groups, avoids milk -little intolerance   Caffeine: 1 cup coffee, unsweet iced tea   Water: 6 cups daily       Wears seat belt   Smoke detectors at home    Does not use phone while driving    Social Determinants of Health   Financial Resource Strain: Not on file  Food Insecurity: Not on file  Transportation Needs: Not on file  Physical Activity: Not on file  Stress: Not on file  Social Connections: Not on file  Intimate Partner Violence: Not on file      PHYSICAL EXAM  Vitals:   10/25/20 0816  BP: (!) 151/70  Pulse: (!) 57  Weight: 154 lb 3.2 oz (69.9 kg)  Height: 5\' 3"  (1.6 m)   Body mass index is 27.32 kg/m.  Generalized: Well developed, in no acute distress  Chest: Lungs clear to auscultation bilaterally  Neurological examination  Mentation: Alert oriented to time, place, history taking. Follows all commands speech and language fluent Cranial nerve II-XII: Extraocular movements were full, visual field  were full on confrontational test Head turning and shoulder shrug  were normal and symmetric. Motor: The motor testing reveals 5 over 5 strength of all 4 extremities. Good symmetric motor tone is noted throughout.  Sensory: Sensory testing is intact to soft touch on all 4 extremities. No evidence of extinction is noted.  Gait and station: Gait is normal.    DIAGNOSTIC DATA (  LABS, IMAGING, TESTING) - I reviewed patient records, labs, notes, testing and imaging myself where available.  Lab Results  Component Value Date   WBC 5.7 08/23/2020   HGB 13.6 08/23/2020   HCT 42.0 08/23/2020   MCV 89 08/23/2020   PLT 309 08/23/2020      Component Value Date/Time   NA 140 08/23/2020 0858   K 5.4 (H) 08/23/2020 0858   CL 102 08/23/2020 0858   CO2 22 08/23/2020 0858   GLUCOSE 111 (H) 08/23/2020 0858   GLUCOSE 104 (H) 11/10/2019 0818   BUN 18 08/23/2020 0858   CREATININE 0.77 08/23/2020 0858   CREATININE 0.91 11/10/2019 0818   CALCIUM 9.9 08/23/2020 0858   PROT 7.3 08/23/2020 0858   ALBUMIN 4.3 08/23/2020 0858   AST 16 08/23/2020 0858   ALT 9 08/23/2020 0858   ALKPHOS 113 08/23/2020 0858   BILITOT 0.3 08/23/2020 0858   GFRNONAA 79 01/30/2020 1622   GFRNONAA 69 11/10/2019 0818   GFRAA 91 01/30/2020 1622   GFRAA 79 11/10/2019 0818   Lab Results  Component Value Date   CHOL 192 08/23/2020   HDL 55 08/23/2020   LDLCALC 117 (H) 08/23/2020   TRIG 113 08/23/2020   CHOLHDL 4.4 10/27/2019   Lab Results  Component Value Date   HGBA1C 5.3 08/23/2020   No results found for: VITAMINB12 Lab Results  Component Value Date   TSH 2.930 08/23/2020      ASSESSMENT AND PLAN 60 y.o. year old female  has a past medical history of Allergy, Asthma, Diabetes mellitus without complication (HCC), Hyperlipidemia, and OSA on CPAP. here with:  OSA on CPAP  - CPAP compliance excellent - Good treatment of AHI  - Encourage patient to use CPAP nightly and > 4 hours each night - F/U in 1 year or  sooner if needed   Butch Penny, MSN, NP-C 10/25/2020, 8:23 AM [facility] [address] [place], [place] [address] [phone]

## 2020-10-25 ENCOUNTER — Telehealth: Payer: Self-pay | Admitting: *Deleted

## 2020-10-25 ENCOUNTER — Encounter: Payer: Self-pay | Admitting: Adult Health

## 2020-10-25 ENCOUNTER — Ambulatory Visit: Payer: Federal, State, Local not specified - PPO | Admitting: Adult Health

## 2020-10-25 VITALS — BP 151/70 | HR 57 | Ht 63.0 in | Wt 154.2 lb

## 2020-10-25 DIAGNOSIS — G4733 Obstructive sleep apnea (adult) (pediatric): Secondary | ICD-10-CM | POA: Diagnosis not present

## 2020-10-25 DIAGNOSIS — Z9989 Dependence on other enabling machines and devices: Secondary | ICD-10-CM

## 2020-10-25 NOTE — Telephone Encounter (Signed)
Orders faxed to [facility] [phone]. Fax confirmation received.

## 2020-10-25 NOTE — Patient Instructions (Signed)
Continue using CPAP nightly and greater than 4 hours each night °If your symptoms worsen or you develop new symptoms please let us know.  ° °

## 2020-10-30 ENCOUNTER — Encounter: Payer: Self-pay | Admitting: Orthopaedic Surgery

## 2020-10-30 ENCOUNTER — Other Ambulatory Visit: Payer: Self-pay

## 2020-10-30 ENCOUNTER — Ambulatory Visit: Payer: Federal, State, Local not specified - PPO | Admitting: Orthopaedic Surgery

## 2020-10-30 VITALS — BP 131/69 | HR 61 | Ht 63.0 in | Wt 152.0 lb

## 2020-10-30 DIAGNOSIS — M25512 Pain in left shoulder: Secondary | ICD-10-CM | POA: Diagnosis not present

## 2020-10-30 DIAGNOSIS — G8929 Other chronic pain: Secondary | ICD-10-CM | POA: Diagnosis not present

## 2020-10-30 DIAGNOSIS — G4733 Obstructive sleep apnea (adult) (pediatric): Secondary | ICD-10-CM | POA: Diagnosis not present

## 2020-10-30 DIAGNOSIS — I10 Essential (primary) hypertension: Secondary | ICD-10-CM | POA: Diagnosis not present

## 2020-10-30 NOTE — Progress Notes (Signed)
I am better.  Her left shoulder is improved. She has completed OT/PT.  She is doing her exercises at home.  She has no new trauma.  ROM of the left shoulder is full with most pain with putting her hand behind her back.  NV intact.  Encounter Diagnosis  Name Primary?   Chronic left shoulder pain Yes   I will see as need.  I have reviewed the final OT notes.  Call if any problem.  Precautions discussed.  Electronically Signed Darreld Mclean, MD 10/11/20223:30 PM

## 2020-11-01 ENCOUNTER — Other Ambulatory Visit: Payer: Self-pay | Admitting: *Deleted

## 2020-11-01 DIAGNOSIS — E119 Type 2 diabetes mellitus without complications: Secondary | ICD-10-CM

## 2020-11-01 MED ORDER — OZEMPIC (1 MG/DOSE) 2 MG/1.5ML ~~LOC~~ SOPN: 1.0000 mg | PEN_INJECTOR | SUBCUTANEOUS | 1 refills | Status: DC

## 2020-11-30 DIAGNOSIS — E785 Hyperlipidemia, unspecified: Secondary | ICD-10-CM | POA: Diagnosis not present

## 2020-12-01 LAB — LIPID PANEL WITH LDL/HDL RATIO
Cholesterol, Total: 119 mg/dL (ref 100–199)
HDL: 67 mg/dL (ref >39)
LDL Chol Calc (NIH): 35 mg/dL (ref 0–99)
LDL/HDL Ratio: 0.5 ratio (ref 0.0–3.2)
Triglycerides: 86 mg/dL (ref 0–149)
VLDL Cholesterol Cal: 17 mg/dL (ref 5–40)

## 2020-12-04 ENCOUNTER — Ambulatory Visit: Payer: Federal, State, Local not specified - PPO | Admitting: Nurse Practitioner

## 2020-12-22 ENCOUNTER — Other Ambulatory Visit: Payer: Self-pay | Admitting: Family Medicine

## 2020-12-22 DIAGNOSIS — E039 Hypothyroidism, unspecified: Secondary | ICD-10-CM

## 2020-12-26 ENCOUNTER — Encounter: Payer: Self-pay | Admitting: Nurse Practitioner

## 2020-12-26 ENCOUNTER — Other Ambulatory Visit: Payer: Self-pay

## 2020-12-26 ENCOUNTER — Ambulatory Visit: Payer: Federal, State, Local not specified - PPO | Admitting: Nurse Practitioner

## 2020-12-26 VITALS — BP 131/74 | HR 62 | Temp 98.5°F | Resp 16 | Ht 63.0 in | Wt 154.8 lb

## 2020-12-26 DIAGNOSIS — R200 Anesthesia of skin: Secondary | ICD-10-CM

## 2020-12-26 DIAGNOSIS — R202 Paresthesia of skin: Secondary | ICD-10-CM | POA: Diagnosis not present

## 2020-12-26 NOTE — Progress Notes (Signed)
Acute Office Visit  Subjective:    Patient ID: Sophia Robbins, female    DOB: March 15, 1960, 60 y.o.   MRN: [id number]  Chief Complaint  Patient presents with   Numbness    She she finished physical therapy on her left shoulder, about a month or two ago her ROM is much better but her right arm and hand will go numb at night and sometimes when she is driving. Its the last 3 digits on her right hand and her hand will ache when she wakes up and its numb.. The left arm isn't bad or happen near as often, and sometimes she has a burning nerve like pain in her right armpit occasionally     HPI Patient is in today for right arm pain. She has right arm numbness and tingling that radiates into her 3rd-5th fingers on her right hand. She states her pain started after having PT for left arm pain. Her numbness is worse with driving and she has seen ortho and PT.   Past Medical History:  Diagnosis Date   Allergy    Asthma    Diabetes mellitus without complication ([place])    Hyperlipidemia    OSA on CPAP     Past Surgical History:  Procedure Laterality Date   APPENDECTOMY     BIOPSY  06/26/2020   Procedure: BIOPSY;  Surgeon: Harvel Quale, MD;  [facility];  Service: Gastroenterology;;   CESAREAN SECTION     COLONOSCOPY WITH PROPOFOL N/A 06/26/2020   Procedure: COLONOSCOPY WITH PROPOFOL;  Surgeon: Harvel Quale, MD;  [facility];  Service: Gastroenterology;  Laterality: N/A;  9:00   ESOPHAGOGASTRODUODENOSCOPY (EGD) WITH PROPOFOL N/A 06/26/2020   Procedure: ESOPHAGOGASTRODUODENOSCOPY (EGD) WITH PROPOFOL;  Surgeon: Harvel Quale, MD;  [facility];  Service: Gastroenterology;  Laterality: N/A;   EXPLORATION POST OPERATIVE OPEN HEART     KNEE ARTHROSCOPY AND ARTHROTOMY Left    TUBAL LIGATION      Family History  Problem Relation Age of Onset   Cancer Mother    Cancer Father    Diabetes Sister    Sleep apnea Sister    COPD Brother     Sleep apnea Brother     Social History   Socioeconomic History   Marital status: Married    Spouse name: Not on file   Number of children: 1   Years of education: Not on file   Highest education level: Some college, no degree  Occupational History   Occupation: Dietitian    Comment: [employer]  Tobacco Use   Smoking status: Former    Types: Cigarettes    Quit date: 06/20/2012    Years since quitting: 8.5   Smokeless tobacco: Never  Vaping Use   Vaping Use: Never used  Substance and Sexual Activity   Alcohol use: Yes    Alcohol/week: 3.0 standard drinks    Types: 3 Glasses of wine per week    Comment: occasional   Drug use: Not Currently   Sexual activity: Yes    Birth control/protection: Surgical    Comment: tubal  Other Topics Concern   Not on file  Social History Narrative   Lives with husband of 7 years    1 daughter -granddaughter and grandson      Two cats-outside      Enjoys: crafts, Passenger transport manager, redoing home       Diet: eat all food groups, avoids milk -little intolerance  Caffeine: 1 cup coffee, unsweet iced tea   Water: 6 cups daily       Wears seat belt   Smoke detectors at home    Does not use phone while driving    Social Determinants of Health   Financial Resource Strain: Not on file  Food Insecurity: Not on file  Transportation Needs: Not on file  Physical Activity: Not on file  Stress: Not on file  Social Connections: Not on file  Intimate Partner Violence: Not on file    Outpatient Medications Prior to Visit  Medication Sig Dispense Refill   amLODipine (NORVASC) 5 MG tablet Take 1 tablet (5 mg total) by mouth daily. 90 tablet 1   aspirin EC 81 MG tablet Take 81 mg by mouth daily.     cetirizine (ZYRTEC) 10 MG tablet Take 10 mg by mouth daily.     clobetasol ointment (TEMOVATE) 3.53 % Apply 1 application topically 2 (two) times daily as needed (Skin Rash).     Evolocumab (REPATHA SURECLICK) 299 MG/ML SOAJ Inject 140 mg into the skin  every 14 (fourteen) days. 2 mL 11   ezetimibe (ZETIA) 10 MG tablet TAKE 1 TABLET BY MOUTH EVERY DAY 90 tablet 2   fluticasone (FLOVENT HFA) 110 MCG/ACT inhaler Inhale 2 puffs into the lungs daily as needed (Shortness of breath).     glucose blood test strip Use as instructed 100 each 12   levothyroxine (SYNTHROID) 25 MCG tablet TAKE 2 TABLETS (50 MCG TOTAL) BY MOUTH DAILY BEFORE BREAKFAST 180 tablet 0   losartan (COZAAR) 100 MG tablet TAKE 1 TABLET BY MOUTH EVERY DAY 90 tablet 2   metFORMIN (GLUCOPHAGE) 500 MG tablet TAKE 2 TABLETS BY MOUTH EVERY DAY WITH BREAKFAST 180 tablet 0   nystatin cream (MYCOSTATIN) Apply 1 application topically daily as needed for dry skin.     Semaglutide, 1 MG/DOSE, (OZEMPIC, 1 MG/DOSE,) 2 MG/1.5ML SOPN Inject 1 mg into the skin once a week. 18 mL 1   triamcinolone cream (KENALOG) 0.1 % Apply 1 application topically 2 (two) times daily as needed (Rash).     No facility-administered medications prior to visit.    Allergies  Allergen Reactions   Other Itching, Rash, Shortness Of Breath and Swelling   Codeine Hives   Shellfish Allergy Hives   Statins Hives    Blisters and peeling of skin    Review of Systems  Constitutional: Negative.   Neurological:  Positive for numbness.       Per HPI      Objective:    Physical Exam Constitutional:      Appearance: Normal appearance.  Musculoskeletal:        General: No swelling, tenderness, deformity or signs of injury.  Neurological:     Mental Status: She is alert.     Sensory: Sensory deficit present.     Comments: Right 3-5th fingers    BP 131/74   Pulse 62   Temp 98.5 F (36.9 C) (Oral)   Resp 16   Ht '5\' 3"'  (1.6 m)   Wt 154 lb 12.8 oz (70.2 kg)   SpO2 98%   BMI 27.42 kg/m  Wt Readings from Last 3 Encounters:  12/26/20 154 lb 12.8 oz (70.2 kg)  10/30/20 152 lb (68.9 kg)  10/25/20 154 lb 3.2 oz (69.9 kg)    Health Maintenance Due  Topic Date Due   Pneumococcal Vaccine 18-99 Years old (1 -  PCV) Never done   HIV Screening  Never  done   Zoster Vaccines- Shingrix (1 of 2) Never done   COVID-19 Vaccine (5 - Booster for Moderna series) 09/20/2020    There are no preventive care reminders to display for this patient.   Lab Results  Component Value Date   TSH 2.930 08/23/2020   Lab Results  Component Value Date   WBC 5.7 08/23/2020   HGB 13.6 08/23/2020   HCT 42.0 08/23/2020   MCV 89 08/23/2020   PLT 309 08/23/2020   Lab Results  Component Value Date   NA 140 08/23/2020   K 5.4 (H) 08/23/2020   CO2 22 08/23/2020   GLUCOSE 111 (H) 08/23/2020   BUN 18 08/23/2020   CREATININE 0.77 08/23/2020   BILITOT 0.3 08/23/2020   ALKPHOS 113 08/23/2020   AST 16 08/23/2020   ALT 9 08/23/2020   PROT 7.3 08/23/2020   ALBUMIN 4.3 08/23/2020   CALCIUM 9.9 08/23/2020   EGFR 88 08/23/2020   Lab Results  Component Value Date   CHOL 119 11/30/2020   Lab Results  Component Value Date   HDL 67 11/30/2020   Lab Results  Component Value Date   LDLCALC 35 11/30/2020   Lab Results  Component Value Date   TRIG 86 11/30/2020   Lab Results  Component Value Date   CHOLHDL 4.4 10/27/2019   Lab Results  Component Value Date   HGBA1C 5.3 08/23/2020       Assessment & Plan:   Problem List Items Addressed This Visit       Other   Numbness and tingling of right arm - Primary    -has seen ortho and PT -referral to neurosurgery      Relevant Orders   Ambulatory referral to Neurosurgery     No orders of the defined types were placed in this encounter.    Noreene Larsson, NP

## 2020-12-26 NOTE — Assessment & Plan Note (Signed)
-  has seen ortho and PT -referral to neurosurgery

## 2021-01-24 ENCOUNTER — Other Ambulatory Visit: Payer: Self-pay | Admitting: Family Medicine

## 2021-01-24 DIAGNOSIS — E119 Type 2 diabetes mellitus without complications: Secondary | ICD-10-CM

## 2021-03-01 ENCOUNTER — Other Ambulatory Visit: Payer: Self-pay | Admitting: Neurological Surgery

## 2021-03-01 DIAGNOSIS — I10 Essential (primary) hypertension: Secondary | ICD-10-CM | POA: Diagnosis not present

## 2021-03-01 DIAGNOSIS — Z6826 Body mass index (BMI) 26.0-26.9, adult: Secondary | ICD-10-CM | POA: Diagnosis not present

## 2021-03-01 DIAGNOSIS — M5412 Radiculopathy, cervical region: Secondary | ICD-10-CM | POA: Diagnosis not present

## 2021-03-04 LAB — HM DIABETES EYE EXAM

## 2021-03-05 ENCOUNTER — Other Ambulatory Visit: Payer: Self-pay

## 2021-03-05 ENCOUNTER — Ambulatory Visit (INDEPENDENT_AMBULATORY_CARE_PROVIDER_SITE_OTHER): Payer: Federal, State, Local not specified - PPO

## 2021-03-05 ENCOUNTER — Encounter: Payer: Self-pay | Admitting: Nurse Practitioner

## 2021-03-05 DIAGNOSIS — M542 Cervicalgia: Secondary | ICD-10-CM | POA: Diagnosis not present

## 2021-03-05 DIAGNOSIS — R200 Anesthesia of skin: Secondary | ICD-10-CM | POA: Diagnosis not present

## 2021-03-05 DIAGNOSIS — M4802 Spinal stenosis, cervical region: Secondary | ICD-10-CM | POA: Diagnosis not present

## 2021-03-05 DIAGNOSIS — M5412 Radiculopathy, cervical region: Secondary | ICD-10-CM | POA: Diagnosis not present

## 2021-03-13 ENCOUNTER — Encounter: Payer: Self-pay | Admitting: Physical Therapy

## 2021-03-13 ENCOUNTER — Other Ambulatory Visit: Payer: Self-pay

## 2021-03-13 ENCOUNTER — Ambulatory Visit: Payer: Federal, State, Local not specified - PPO | Attending: Neurological Surgery | Admitting: Physical Therapy

## 2021-03-13 DIAGNOSIS — M6281 Muscle weakness (generalized): Secondary | ICD-10-CM | POA: Insufficient documentation

## 2021-03-13 DIAGNOSIS — R293 Abnormal posture: Secondary | ICD-10-CM | POA: Diagnosis not present

## 2021-03-13 DIAGNOSIS — M5412 Radiculopathy, cervical region: Secondary | ICD-10-CM | POA: Insufficient documentation

## 2021-03-13 NOTE — Therapy (Signed)
[facility] [place] [address] [place], [place], [address] Phone: [phone]   Fax:  [phone]  Physical Therapy Evaluation  Patient Details  Name: Sophia Robbins MRN: [id number] Date of Birth: November 01, 1960 Referring Provider (PT): ELSNER,HENRY   Encounter Date: 03/13/2021   PT End of Session - 03/13/21 1647     Visit Number 1    Number of Visits 12    Date for PT Re-Evaluation 04/24/21    PT Start Time 1600    PT Stop Time 1645    PT Time Calculation (min) 45 min    Activity Tolerance Patient tolerated treatment well    Behavior During Therapy [facility] for tasks assessed/performed             Past Medical History:  Diagnosis Date   Allergy    Asthma    Diabetes mellitus without complication (HCC)    Hyperlipidemia    OSA on CPAP     Past Surgical History:  Procedure Laterality Date   APPENDECTOMY     BIOPSY  06/26/2020   Procedure: BIOPSY;  Surgeon: Dolores Frame, MD;  [facility];  Service: Gastroenterology;;   CESAREAN SECTION     COLONOSCOPY WITH PROPOFOL N/A 06/26/2020   Procedure: COLONOSCOPY WITH PROPOFOL;  Surgeon: Dolores Frame, MD;  [facility];  Service: Gastroenterology;  Laterality: N/A;  9:00   ESOPHAGOGASTRODUODENOSCOPY (EGD) WITH PROPOFOL N/A 06/26/2020   Procedure: ESOPHAGOGASTRODUODENOSCOPY (EGD) WITH PROPOFOL;  Surgeon: Dolores Frame, MD;  [facility];  Service: Gastroenterology;  Laterality: N/A;   EXPLORATION POST OPERATIVE OPEN HEART     KNEE ARTHROSCOPY AND ARTHROTOMY Left    TUBAL LIGATION      There were no vitals filed for this visit.    Subjective Assessment - 03/13/21 1603     Subjective Pt had a knee injury 03/2020 that caused her to have to use crutches. After using crutches she developed shoulder pain and performed PT for her shoulders. She states that during shoulder physical therapy 09/2020 she began having  radicular pain down her Rt UE. She states that pain wakes her frequently during sleep and that her 3rd and 4th digits are always numb. Symptoms increase during sleep and driving, symptoms decrease with mobility (walking).    Limitations Sitting   sleeping   How long can you sit comfortably? 10-15 minutes    Diagnostic tests MRI: Cervical spine degeneration with up to mild spinal stenosis at  both C5-C6 and C6-C7 related to disc bulging and posterior element  hypertrophy. No spinal cord mass effect or signal abnormality.  Up to moderate degenerative neural foraminal stenosis at the left C5  and left > right C6 nerve levels.    Patient Stated Goals decrease the numbness, be able to sleep without pain/symptoms    Currently in Pain? Yes    Pain Score 5     Pain Location Shoulder    Pain Orientation Right    Pain Descriptors / Indicators Sore    Pain Type Chronic pain    Pain Radiating Towards Rt UE    Pain Onset More than a month ago    Pain Frequency Constant    Aggravating Factors  sleep, drive    Pain Relieving Factors moving around                [facility] PT Assessment - 03/13/21 0001       Assessment   Medical Diagnosis cervical radiculopathy  Referring Provider (PT) ELSNER,HENRY    Onset Date/Surgical Date 09/20/20    Hand Dominance Right    Next MD Visit 03/20/21    Prior Therapy for knee and shoulders      Precautions   Precautions None      Restrictions   Weight Bearing Restrictions No      Balance Screen   Has the patient fallen in the past 6 months No      Prior Function   Level of Independence Independent    Vocation Requirements works at [employer]      Observation/Other Assessments   Focus on Therapeutic Outcomes (FOTO)  64      ROM / Strength   AROM / PROM / Strength AROM;Strength      AROM   AROM Assessment Site Cervical    Cervical Flexion 50    Cervical Extension 55    Cervical - Right Side Bend 35    Cervical - Left Side Bend 38     Cervical - Right Rotation 60    Cervical - Left Rotation 55      Strength   Overall Strength Comments bilat grip strength 37#    Strength Assessment Site Shoulder    Right/Left Shoulder Right;Left    Right Shoulder Flexion 4/5    Right Shoulder ABduction 4/5    Left Shoulder Flexion 4/5    Left Shoulder ABduction 4/5      Palpation   Spinal mobility hypomoble CPAs C5-7    Palpation comment TTP Rt upper traps, Rt suboccipitlas, bilat cervical paraspinals      Special Tests   Other special tests median nerve tension (+)                        Objective measurements completed on examination: See above findings.       [facility] Adult PT Treatment/Exercise - 03/13/21 0001       Exercises   Exercises Neck      Neck Exercises: Standing   Other Standing Exercises median/ulnar nerve glide x 10      Neck Exercises: Seated   Other Seated Exercise scap squeeze x 5      Neck Exercises: Supine   Neck Retraction 5 reps;5 secs      Neck Exercises: Stretches   Other Neck Stretches doorway stretch 90 degrees x 30 sec      Manual Therapy   Manual Therapy Manual Traction    Manual Traction manual cervical traction 30 sec on, 10 sec off x 10                     PT Education - 03/13/21 1641     Education Details PT POC and goals, HEP    Person(s) Educated Patient    Methods Explanation;Demonstration;Handout    Comprehension Verbalized understanding;Returned demonstration                 PT Long Term Goals - 03/13/21 1650       PT LONG TERM GOAL #1   Title Pt will be independent with HEP    Time 6    Period Weeks    Status New    Target Date 04/24/21      PT LONG TERM GOAL #2   Title Pt will improve FOTO to >= 70 to demo improved functional mobility    Time 6    Period Weeks    Status New  Target Date 04/24/21      PT LONG TERM GOAL #3   Title Pt will be able to sleep x 6 hours without being awoken by pain    Time 6    Period Weeks     Status New    Target Date 04/24/21      PT LONG TERM GOAL #4   Title pt will tolerate sitting x 30 minutes with symptoms <= 1/10    Time 6    Period Weeks    Status New    Target Date 04/24/21                    Plan - 03/13/21 1647     Clinical Impression Statement Pt is a 61 y/o female referred for cervical radiculopathy. Pt presents with decreased Rt UE strength, decreased cervical mobility, increased mm spasticity, impaired sensastion, increased pain, decreased activity tolerance. Pt will benefit from skilled PT to address deficits and improve functional activity tolerance    Personal Factors and Comorbidities Time since onset of injury/illness/exacerbation;Profession    Examination-Activity Limitations Sleep;Sit;Lift    Examination-Participation Restrictions Community Activity;Occupation;Driving    Stability/Clinical Decision Making Stable/Uncomplicated    Clinical Decision Making Low    Rehab Potential Good    PT Frequency 2x / week    PT Duration 6 weeks    PT Treatment/Interventions Aquatic Therapy;Cryotherapy;Moist Heat;Traction;Iontophoresis 4mg /ml Dexamethasone;Electrical Stimulation;Neuromuscular re-education;Therapeutic exercise;Therapeutic activities;Patient/family education;Manual techniques;Passive range of motion;Dry needling;Taping    PT Next Visit Plan assess HEP, trial of mechanical traction, postural strength    PT Home Exercise Plan [email or web address]    Consulted and Agree with Plan of Care Patient             Patient will benefit from skilled therapeutic intervention in order to improve the following deficits and impairments:  Pain, Postural dysfunction, Decreased strength, Decreased activity tolerance, Decreased range of motion, Impaired sensation, Increased muscle spasms  Visit Diagnosis: Radiculopathy, cervical region - Plan: PT plan of care cert/re-cert  Abnormal posture - Plan: PT plan of care cert/re-cert  Muscle weakness (generalized) -  Plan: PT plan of care cert/re-cert     Problem List Patient Active Problem List   Diagnosis Date Noted   Numbness and tingling of right arm 12/26/2020   Left shoulder pain 08/23/2020   Eye exam, routine 10/27/2019   Vitamin D deficiency 10/27/2019   Hypothyroidism 05/25/2019   OSA (obstructive sleep apnea) 04/14/2019   Essential hypertension 04/14/2019   Hyperlipidemia LDL goal <70 04/14/2019   Diabetes mellitus without complication (HCC) 04/14/2019   Annual visit for general adult medical examination with abnormal findings 04/14/2019    Gena Laski, PT 03/13/2021, 4:53 PM  [facility] [address] [place] [address] [place], [place], [place] Phone: [phone]   Fax:  [phone]  Name: Kerensa Nicklas MRN: [name] Date of Birth: 04-07-1960

## 2021-03-13 NOTE — Patient Instructions (Signed)
Access Code: [email or web address] URL: [email or web address] Date: 03/13/2021 Prepared by: Reggy Eye  Exercises Ulnar Nerve/Median Glide- Low Level - 1 x daily - 7 x weekly - 1 sets - 10 reps Seated Scapular Retraction - 1 x daily - 7 x weekly - 2 sets - 10 reps Seated Cervical Retraction - 1 x daily - 7 x weekly - 2 sets - 10 reps Doorway Pec Stretch at 90 Degrees Abduction - 1 x daily - 7 x weekly - 1 sets - 3 reps - 20-30 sec hold

## 2021-03-20 DIAGNOSIS — M5412 Radiculopathy, cervical region: Secondary | ICD-10-CM | POA: Diagnosis not present

## 2021-03-20 DIAGNOSIS — Z6827 Body mass index (BMI) 27.0-27.9, adult: Secondary | ICD-10-CM | POA: Diagnosis not present

## 2021-03-20 DIAGNOSIS — I10 Essential (primary) hypertension: Secondary | ICD-10-CM | POA: Diagnosis not present

## 2021-03-24 ENCOUNTER — Other Ambulatory Visit: Payer: Self-pay | Admitting: Family Medicine

## 2021-03-24 DIAGNOSIS — E119 Type 2 diabetes mellitus without complications: Secondary | ICD-10-CM

## 2021-03-25 ENCOUNTER — Other Ambulatory Visit: Payer: Self-pay | Admitting: Family Medicine

## 2021-03-25 ENCOUNTER — Telehealth: Payer: Self-pay

## 2021-03-25 DIAGNOSIS — E119 Type 2 diabetes mellitus without complications: Secondary | ICD-10-CM

## 2021-03-25 MED ORDER — METFORMIN HCL 500 MG PO TABS: 500.0000 mg | ORAL_TABLET | Freq: Two times a day (BID) | ORAL | 0 refills | Status: DC

## 2021-03-25 NOTE — Telephone Encounter (Signed)
Patient called and need med refills, has appt 4/14 with [facility] ? ? ezetimibe (ZETIA) 10 MG tablet  ? ?amLODipine (NORVASC) 5 MG tablet ? ?losartan (COZAAR) 100 MG tablet ? ?metFORMIN (GLUCOPHAGE) 500 MG tablet ? ?Pharmacy: [facility] [place] ?

## 2021-03-26 ENCOUNTER — Other Ambulatory Visit: Payer: Self-pay

## 2021-03-26 DIAGNOSIS — E785 Hyperlipidemia, unspecified: Secondary | ICD-10-CM

## 2021-03-26 DIAGNOSIS — E119 Type 2 diabetes mellitus without complications: Secondary | ICD-10-CM

## 2021-03-26 DIAGNOSIS — I10 Essential (primary) hypertension: Secondary | ICD-10-CM

## 2021-03-26 MED ORDER — EZETIMIBE 10 MG PO TABS: 10.0000 mg | ORAL_TABLET | Freq: Every day | ORAL | 2 refills | Status: DC

## 2021-03-26 MED ORDER — AMLODIPINE BESYLATE 5 MG PO TABS: 5.0000 mg | ORAL_TABLET | Freq: Every day | ORAL | 1 refills | Status: DC

## 2021-03-26 MED ORDER — LOSARTAN POTASSIUM 100 MG PO TABS: 100.0000 mg | ORAL_TABLET | Freq: Every day | ORAL | 2 refills | Status: DC

## 2021-03-26 NOTE — Telephone Encounter (Signed)
Sent to pharmacy 

## 2021-03-28 ENCOUNTER — Ambulatory Visit: Payer: Federal, State, Local not specified - PPO | Attending: Neurological Surgery | Admitting: Physical Therapy

## 2021-03-28 ENCOUNTER — Other Ambulatory Visit: Payer: Self-pay

## 2021-03-28 DIAGNOSIS — G8929 Other chronic pain: Secondary | ICD-10-CM | POA: Diagnosis not present

## 2021-03-28 DIAGNOSIS — M5412 Radiculopathy, cervical region: Secondary | ICD-10-CM

## 2021-03-28 DIAGNOSIS — M25612 Stiffness of left shoulder, not elsewhere classified: Secondary | ICD-10-CM | POA: Diagnosis not present

## 2021-03-28 DIAGNOSIS — R293 Abnormal posture: Secondary | ICD-10-CM

## 2021-03-28 DIAGNOSIS — M25512 Pain in left shoulder: Secondary | ICD-10-CM

## 2021-03-28 DIAGNOSIS — R29898 Other symptoms and signs involving the musculoskeletal system: Secondary | ICD-10-CM | POA: Insufficient documentation

## 2021-03-28 DIAGNOSIS — M6281 Muscle weakness (generalized): Secondary | ICD-10-CM | POA: Insufficient documentation

## 2021-03-28 NOTE — Therapy (Signed)
[place] [facility] ?[place] ?[place], [place], [address] ?Phone: [phone]   Fax:  [phone] ? ?Physical Therapy Treatment ? ?Patient Details  ?Name: Sophia Robbins ?MRN: [id number] ?Date of Birth: Jan 10, 1961 ?Referring Provider (PT): ELSNER,HENRY ? ? ?Encounter Date: 03/28/2021 ? ? PT End of Session - 03/28/21 1641   ? ? Visit Number 2   ? Number of Visits 12   ? Date for PT Re-Evaluation 04/24/21   ? PT Start Time 1642   ? PT Stop Time [id number]   ? PT Time Calculation (min) 43 min   ? Activity Tolerance Patient tolerated treatment well   ? Behavior During Therapy [facility] for tasks assessed/performed   ? ?  ?  ? ?  ? ? ?Past Medical History:  ?Diagnosis Date  ? Allergy   ? Asthma   ? Diabetes mellitus without complication ([place])   ? Hyperlipidemia   ? OSA on CPAP   ? ? ?Past Surgical History:  ?Procedure Laterality Date  ? APPENDECTOMY    ? BIOPSY  06/26/2020  ? Procedure: BIOPSY;  Surgeon: Harvel Quale, MD;  [facility];  Service: Gastroenterology;;  ? CESAREAN SECTION    ? COLONOSCOPY WITH PROPOFOL N/A 06/26/2020  ? Procedure: COLONOSCOPY WITH PROPOFOL;  Surgeon: Harvel Quale, MD;  [facility];  Service: Gastroenterology;  Laterality: N/A;  9:00  ? ESOPHAGOGASTRODUODENOSCOPY (EGD) WITH PROPOFOL N/A 06/26/2020  ? Procedure: ESOPHAGOGASTRODUODENOSCOPY (EGD) WITH PROPOFOL;  Surgeon: Harvel Quale, MD;  [facility];  Service: Gastroenterology;  Laterality: N/A;  ? EXPLORATION POST OPERATIVE OPEN HEART    ? KNEE ARTHROSCOPY AND ARTHROTOMY Left   ? TUBAL LIGATION    ? ? ?There were no vitals filed for this visit. ? ? Subjective Assessment - 03/28/21 1644   ? ? Subjective Pt states MD gave her medication to help her sleep but felt it only helped for a few night. Pt states exercises has helped to get her to sleep. 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   ? Limitations Sitting    sleeping  ? How long can you sit comfortably? 10-15 minutes   ? Diagnostic tests MRI: Cervical spine degeneration with up to mild spinal stenosis at  both C5-C6 and C6-C7 related to disc bulging and posterior element  hypertrophy. No spinal cord mass effect or signal abnormality.  Up to moderate degenerative neural foraminal stenosis at the left C5  and left > right C6 nerve levels.   ? Patient Stated Goals decrease the numbness, be able to sleep without pain/symptoms   ? Currently in Pain? Yes   ? Pain Score 7    ? Pain Location Shoulder   ? Pain Orientation Right;Left   ? Pain Onset More than a month ago   ? ?  ?  ? ?  ? ? ? ? ? [facility] PT Assessment - 03/28/21 0001   ? ?  ? Assessment  ? Medical Diagnosis cervical radiculopathy   ? Referring Provider (PT) ELSNER,HENRY   ? Onset Date/Surgical Date 09/20/20   ? Hand Dominance Right   ? ?  ?  ? ?  ? ? ? ? ? ? ? ? ? ? ? ? ? ? ? ? [facility] Adult PT Treatment/Exercise - 03/28/21 0001   ? ?  ? Neck Exercises: Seated  ? W Back 20 reps   ? W Back Limitations with neck retraction   ?  Money 20 reps   ? Money Limitations with neck retraction   ? Shoulder Rolls 5 reps   ? Other Seated Exercise scap squeeze x 5   ?  ? Neck Exercises: Stretches  ? Upper Trapezius Stretch Right;Left;20 seconds   ? Levator Stretch Right;Left;20 seconds   ? Other Neck Stretches doorway stretch 90 and 120 degrees x 20 sec   ? Other Neck Stretches anterior scalene stretch 2x30 sec with towel   ?  ? Hand Exercises for Cervical Radiculopathy  ? Other Hand Exercise for Cervical Radiculopathy median nerve glides week 1 x20   ?  ? Manual Therapy  ? Manual Therapy Soft tissue mobilization;Joint mobilization   ? Joint Mobilization grade III first rib depression; cervical PAs, UPAs and lateral glides grade II to III   ? Soft tissue mobilization STM and TPR anterior/mid/post scalene, UTs, levator scap   ? Manual Traction manual cervical traction 30 sec on, 10 sec off x 10   ? ?  ?  ? ?   ? ? ? ? ? ? ? ? ? ? ? ? ? ? ? PT Long Term Goals - 03/13/21 1650   ? ?  ? PT LONG TERM GOAL #1  ? Title Pt will be independent with HEP   ? Time 6   ? Period Weeks   ? Status New   ? Target Date 04/24/21   ?  ? PT LONG TERM GOAL #2  ? Title Pt will improve FOTO to >= 70 to demo improved functional mobility   ? Time 6   ? Period Weeks   ? Status New   ? Target Date 04/24/21   ?  ? PT LONG TERM GOAL #3  ? Title Pt will be able to sleep x 6 hours without being awoken by pain   ? Time 6   ? Period Weeks   ? Status New   ? Target Date 04/24/21   ?  ? PT LONG TERM GOAL #4  ? Title pt will tolerate sitting x 30 minutes with symptoms <= 1/10   ? Time 6   ? Period Weeks   ? Status New   ? Target Date 04/24/21   ? ?  ?  ? ?  ? ? ? ? ? ? ? ? Plan - 03/28/21 1729   ? ? Clinical Impression Statement Treatment focused on improving pt's postural stability. Cueing to decrease UT overactivity. Worked on anterior scalene stretch to reduce her tension that may be contributing to the N/T. Manual work throughout c-spine for improved mobility. Progressed her nerve glide to include elbow movement and shoulders abducted.   ? Personal Factors and Comorbidities Time since onset of injury/illness/exacerbation;Profession   ? Examination-Activity Limitations Sleep;Sit;Lift   ? Examination-Participation Restrictions Community Activity;Occupation;Driving   ? Stability/Clinical Decision Making Stable/Uncomplicated   ? Rehab Potential Good   ? PT Frequency 2x / week   ? PT Duration 6 weeks   ? PT Treatment/Interventions Aquatic Therapy;Cryotherapy;Moist Heat;Traction;Iontophoresis 4mg /ml Dexamethasone;Electrical Stimulation;Neuromuscular re-education;Therapeutic exercise;Therapeutic activities;Patient/family education;Manual techniques;Passive range of motion;Dry needling;Taping   ? PT Next Visit Plan assess HEP, trial of mechanical traction, postural strength   ? PT Home Exercise Plan [id number]   ? Consulted and Agree with Plan of Care Patient    ? ?  ?  ? ?  ? ? ?Patient will benefit from skilled therapeutic intervention in order to improve the following deficits and impairments:  Pain, Postural dysfunction, Decreased strength, Decreased activity tolerance,  Decreased range of motion, Impaired sensation, Increased muscle spasms ? ?Visit Diagnosis: ?Radiculopathy, cervical region ? ?Abnormal posture ? ?Muscle weakness (generalized) ? ?Chronic left shoulder pain ? ?Stiffness of left shoulder, not elsewhere classified ? ?Other symptoms and signs involving the musculoskeletal system ? ? ? ? ?Problem List ?Patient Active Problem List  ? Diagnosis Date Noted  ? Numbness and tingling of right arm 12/26/2020  ? Left shoulder pain 08/23/2020  ? Eye exam, routine 10/27/2019  ? Vitamin D deficiency 10/27/2019  ? Hypothyroidism 05/25/2019  ? OSA (obstructive sleep apnea) 04/14/2019  ? Essential hypertension 04/14/2019  ? Hyperlipidemia LDL goal <70 04/14/2019  ? Diabetes mellitus without complication ([place]) [id number]  ? Annual visit for general adult medical examination with abnormal findings 04/14/2019  ? ? ?[place] Dalporto April Gordy Levan, PT, DPT ?03/28/2021, 5:32 PM ? ?[place] [facility] ?[place] ?[place], [place], [address] ?Phone: [phone]   Fax:  [phone] ? ?Name: Redia Turco ?MRN: [id number] ?Date of Birth: 01-26-1960 ? ? ? ?

## 2021-04-01 ENCOUNTER — Other Ambulatory Visit: Payer: Self-pay

## 2021-04-01 ENCOUNTER — Ambulatory Visit: Payer: Federal, State, Local not specified - PPO | Admitting: Physical Therapy

## 2021-04-01 DIAGNOSIS — G8929 Other chronic pain: Secondary | ICD-10-CM | POA: Diagnosis not present

## 2021-04-01 DIAGNOSIS — M5412 Radiculopathy, cervical region: Secondary | ICD-10-CM | POA: Diagnosis not present

## 2021-04-01 DIAGNOSIS — R29898 Other symptoms and signs involving the musculoskeletal system: Secondary | ICD-10-CM

## 2021-04-01 DIAGNOSIS — M25612 Stiffness of left shoulder, not elsewhere classified: Secondary | ICD-10-CM

## 2021-04-01 DIAGNOSIS — M25512 Pain in left shoulder: Secondary | ICD-10-CM | POA: Diagnosis not present

## 2021-04-01 DIAGNOSIS — M6281 Muscle weakness (generalized): Secondary | ICD-10-CM | POA: Diagnosis not present

## 2021-04-01 DIAGNOSIS — R293 Abnormal posture: Secondary | ICD-10-CM

## 2021-04-01 NOTE — Therapy (Signed)
[place] ?[facility] ?[place] [place] ?[place], [place], [address] ?Phone: [phone]   Fax:  [phone] ? ?Physical Therapy Treatment ? ?Patient Details  ?Name: Sophia Robbins ?MRN: [id number] ?Date of Birth: 06/03/1960 ?Referring Provider (PT): ELSNER,HENRY ? ? ?Encounter Date: 04/01/2021 ? ? PT End of Session - 04/01/21 1602   ? ? Visit Number 3   ? Number of Visits 12   ? Date for PT Re-Evaluation 04/24/21   ? PT Start Time 1603   ? PT Stop Time [id number]   ? PT Time Calculation (min) 42 min   ? Activity Tolerance Patient tolerated treatment well   ? Behavior During Therapy [facility] for tasks assessed/performed   ? ?  ?  ? ?  ? ? ?Past Medical History:  ?Diagnosis Date  ? Allergy   ? Asthma   ? Diabetes mellitus without complication ([place])   ? Hyperlipidemia   ? OSA on CPAP   ? ? ?Past Surgical History:  ?Procedure Laterality Date  ? APPENDECTOMY    ? BIOPSY  06/26/2020  ? Procedure: BIOPSY;  Surgeon: Harvel Quale, MD;  [facility];  Service: Gastroenterology;;  ? CESAREAN SECTION    ? COLONOSCOPY WITH PROPOFOL N/A 06/26/2020  ? Procedure: COLONOSCOPY WITH PROPOFOL;  Surgeon: Harvel Quale, MD;  [facility];  Service: Gastroenterology;  Laterality: N/A;  9:00  ? ESOPHAGOGASTRODUODENOSCOPY (EGD) WITH PROPOFOL N/A 06/26/2020  ? Procedure: ESOPHAGOGASTRODUODENOSCOPY (EGD) WITH PROPOFOL;  Surgeon: Harvel Quale, MD;  [facility];  Service: Gastroenterology;  Laterality: N/A;  ? EXPLORATION POST OPERATIVE OPEN HEART    ? KNEE ARTHROSCOPY AND ARTHROTOMY Left   ? TUBAL LIGATION    ? ? ?There were no vitals filed for this visit. ? ? Subjective Assessment - 04/01/21 1605   ? ? Subjective Pt reports some soreness. Pt did exercises on Sat. Had a birthday on Sunday (yesterday) so did not do her exercises then. No issues noted with her HEP.   ? Limitations Sitting   sleeping  ? How long can you sit comfortably? 10-15 minutes    ? Diagnostic tests MRI: Cervical spine degeneration with up to mild spinal stenosis at  both C5-C6 and C6-C7 related to disc bulging and posterior element  hypertrophy. No spinal cord mass effect or signal abnormality.  Up to moderate degenerative neural foraminal stenosis at the left C5  and left > right C6 nerve levels.   ? Patient Stated Goals decrease the numbness, be able to sleep without pain/symptoms   ? Currently in Pain? Yes   ? Pain Score 7    ? Pain Location Shoulder   ? Pain Orientation Right   ? Pain Onset More than a month ago   ? ?  ?  ? ?  ? ? ? ? ? [facility] PT Assessment - 04/01/21 0001   ? ?  ? Assessment  ? Medical Diagnosis cervical radiculopathy   ? Referring Provider (PT) ELSNER,HENRY   ? Onset Date/Surgical Date 09/20/20   ? Hand Dominance Right   ? ?  ?  ? ?  ? ? ? ? ? ? ? ? ? ? ? ? ? ? ? ? [facility] Adult PT Treatment/Exercise - 04/01/21 0001   ? ?  ? Neck Exercises: Supine  ? Upper Extremity D1 Flexion;20 reps;Theraband   ? Theraband Level (UE D1) Level 2 (Red)   ? Other Supine Exercise Shoulder ER red tband 2x10 with neck  retraction   ? Other Supine Exercise W with red tband + neck retraction 2x10   ?  ? Neck Exercises: Stretches  ? Levator Stretch Right;Left;20 seconds   ? Other Neck Stretches doorway stretch mid, low, high 2x30 sec   ? Other Neck Stretches anterior scalene stretch 2x30 sec with towel   ?  ? Hand Exercises for Cervical Radiculopathy  ? Other Hand Exercise for Cervical Radiculopathy median nerve glides week 1 x20   ?  ? Modalities  ? Modalities Traction   ?  ? Traction  ? Type of Traction Cervical   ? Min (lbs) 0   ? Max (lbs) 15   ? Hold Time 60 sec   ? Rest Time 60 sec   ? Time 10 min   ?  ? Manual Therapy  ? Joint Mobilization grade III first rib depression; cervical  lateral glides grade II to III   ? Soft tissue mobilization STM and TPR anterior/mid/post scalene, UTs, SCM   ? Manual Traction manual cervical traction 30 sec on, 10 sec off x 10   ? ?  ?  ? ?   ? ? ? ? ? ? ? ? ? ? ? ? ? ? ? PT Long Term Goals - 03/13/21 1650   ? ?  ? PT LONG TERM GOAL #1  ? Title Pt will be independent with HEP   ? Time 6   ? Period Weeks   ? Status New   ? Target Date 04/24/21   ?  ? PT LONG TERM GOAL #2  ? Title Pt will improve FOTO to >= 70 to demo improved functional mobility   ? Time 6   ? Period Weeks   ? Status New   ? Target Date 04/24/21   ?  ? PT LONG TERM GOAL #3  ? Title Pt will be able to sleep x 6 hours without being awoken by pain   ? Time 6   ? Period Weeks   ? Status New   ? Target Date 04/24/21   ?  ? PT LONG TERM GOAL #4  ? Title pt will tolerate sitting x 30 minutes with symptoms <= 1/10   ? Time 6   ? Period Weeks   ? Status New   ? Target Date 04/24/21   ? ?  ?  ? ?  ? ? ? ? ? ? ? ? Plan - 04/01/21 1633   ? ? Clinical Impression Statement Worked on progressing pt's postural stabilization exercises to red tband. Trialed mechanical traction this session.   ? Personal Factors and Comorbidities Time since onset of injury/illness/exacerbation;Profession   ? Examination-Activity Limitations Sleep;Sit;Lift   ? Examination-Participation Restrictions Community Activity;Occupation;Driving   ? Stability/Clinical Decision Making Stable/Uncomplicated   ? Rehab Potential Good   ? PT Frequency 2x / week   ? PT Duration 6 weeks   ? PT Treatment/Interventions Aquatic Therapy;Cryotherapy;Moist Heat;Traction;Iontophoresis 4mg /ml Dexamethasone;Electrical Stimulation;Neuromuscular re-education;Therapeutic exercise;Therapeutic activities;Patient/family education;Manual techniques;Passive range of motion;Dry needling;Taping   ? PT Next Visit Plan assess HEP, how was mechanical traction, postural strength   ? PT Home Exercise Plan [id number]   ? Consulted and Agree with Plan of Care Patient   ? ?  ?  ? ?  ? ? ?Patient will benefit from skilled therapeutic intervention in order to improve the following deficits and impairments:  Pain, Postural dysfunction, Decreased strength, Decreased  activity tolerance, Decreased range of motion, Impaired sensation, Increased muscle spasms ? ?Visit Diagnosis: ?  Radiculopathy, cervical region ? ?Abnormal posture ? ?Muscle weakness (generalized) ? ?Chronic left shoulder pain ? ?Stiffness of left shoulder, not elsewhere classified ? ?Other symptoms and signs involving the musculoskeletal system ? ? ? ? ?Problem List ?Patient Active Problem List  ? Diagnosis Date Noted  ? Numbness and tingling of right arm 12/26/2020  ? Left shoulder pain 08/23/2020  ? Eye exam, routine 10/27/2019  ? Vitamin D deficiency 10/27/2019  ? Hypothyroidism 05/25/2019  ? OSA (obstructive sleep apnea) 04/14/2019  ? Essential hypertension 04/14/2019  ? Hyperlipidemia LDL goal <70 04/14/2019  ? Diabetes mellitus without complication ([place]) [id number]  ? Annual visit for general adult medical examination with abnormal findings 04/14/2019  ? ? ?Kalix Meinecke April Gordy Levan, PT, DPT ?04/01/2021, 5:03 PM ? ?[place] ?[facility] ?[place] ?[place], [place], [address] ?Phone: [phone]   Fax:  [phone] ? ?Name: Lakiea Veron ?MRN: [id number] ?Date of Birth: 23-Oct-1960 ? ? ? ?

## 2021-04-03 ENCOUNTER — Ambulatory Visit: Payer: Federal, State, Local not specified - PPO | Admitting: Physical Therapy

## 2021-04-03 ENCOUNTER — Other Ambulatory Visit: Payer: Self-pay

## 2021-04-03 DIAGNOSIS — R293 Abnormal posture: Secondary | ICD-10-CM

## 2021-04-03 DIAGNOSIS — M25612 Stiffness of left shoulder, not elsewhere classified: Secondary | ICD-10-CM

## 2021-04-03 DIAGNOSIS — M6281 Muscle weakness (generalized): Secondary | ICD-10-CM | POA: Diagnosis not present

## 2021-04-03 DIAGNOSIS — M5412 Radiculopathy, cervical region: Secondary | ICD-10-CM

## 2021-04-03 DIAGNOSIS — G8929 Other chronic pain: Secondary | ICD-10-CM

## 2021-04-03 DIAGNOSIS — R29898 Other symptoms and signs involving the musculoskeletal system: Secondary | ICD-10-CM

## 2021-04-03 DIAGNOSIS — M25512 Pain in left shoulder: Secondary | ICD-10-CM | POA: Diagnosis not present

## 2021-04-03 NOTE — Therapy (Signed)
[place] ?[facility] ?[address] [place] [address] ?[place], [place], [address] ?Phone: [phone]   Fax:  [phone] ? ?Physical Therapy Treatment ? ?Patient Details  ?Name: Sophia Robbins ?MRN: [id number] ?Date of Birth: 1960-05-06 ?Referring Provider (PT): ELSNER,HENRY ? ? ?Encounter Date: 04/03/2021 ? ? PT End of Session - 04/03/21 1606   ? ? Visit Number 4   ? Number of Visits 12   ? Date for PT Re-Evaluation 04/24/21   ? PT Start Time 1605   ? PT Stop Time 1645   ? PT Time Calculation (min) 40 min   ? Activity Tolerance Patient tolerated treatment well   ? Behavior During Therapy [facility] for tasks assessed/performed   ? ?  ?  ? ?  ? ? ?Past Medical History:  ?Diagnosis Date  ? Allergy   ? Asthma   ? Diabetes mellitus without complication (H[facility])   ? Hyperlipidemia   ? OSA on CPAP   ? ? ?Past Surgical History:  ?Procedure Laterality Date  ? APPENDECTOMY    ? BIOPSY  06/26/2020  ? Procedure: BIOPSY;  Surgeon: Dolores Frame, MD;  [facility];  Service: Gastroenterology;;  ? CESAREAN SECTION    ? COLONOSCOPY WITH PROPOFOL N/A 06/26/2020  ? Procedure: COLONOSCOPY WITH PROPOFOL;  Surgeon: Dolores Frame, MD;  [facility];  Service: Gastroenterology;  Laterality: N/A;  9:00  ? ESOPHAGOGASTRODUODENOSCOPY (EGD) WITH PROPOFOL N/A 06/26/2020  ? Procedure: ESOPHAGOGASTRODUODENOSCOPY (EGD) WITH PROPOFOL;  Surgeon: Dolores Frame, MD;  [facility];  Service: Gastroenterology;  Laterality: N/A;  ? EXPLORATION POST OPERATIVE OPEN HEART    ? KNEE ARTHROSCOPY AND ARTHROTOMY Left   ? TUBAL LIGATION    ? ? ?There were no vitals filed for this visit. ? ? Subjective Assessment - 04/03/21 1607   ? ? Subjective Pt reports feeling good after last session on traction. Reports some increased pain today after work -- had to do a lot of bending, pushing, and pulling.   ? Limitations Sitting   sleeping  ? How long can you sit comfortably? 10-15  minutes   ? Diagnostic tests MRI: Cervical spine degeneration with up to mild spinal stenosis at  both C5-C6 and C6-C7 related to disc bulging and posterior element  hypertrophy. No spinal cord mass effect or signal abnormality.  Up to moderate degenerative neural foraminal stenosis at the left C5  and left > right C6 nerve levels.   ? Patient Stated Goals decrease the numbness, be able to sleep without pain/symptoms   ? Currently in Pain? Yes   ? Pain Score 7    ? Pain Location Shoulder   ? Pain Orientation Right   ? Pain Descriptors / Indicators Sore   ? Pain Type Chronic pain   ? Pain Onset More than a month ago   ? ?  ?  ? ?  ? ? ? ? ? [facility] PT Assessment - 04/03/21 0001   ? ?  ? Assessment  ? Medical Diagnosis cervical radiculopathy   ? Referring Provider (PT) ELSNER,HENRY   ? Onset Date/Surgical Date 09/20/20   ? Hand Dominance Right   ? ?  ?  ? ?  ? ? ? ? ? ? ? ? ? ? ? ? ? ? ? ? [facility] Adult PT Treatment/Exercise - 04/03/21 0001   ? ?  ? Neck Exercises: Machines for Strengthening  ? UBE (Upper Arm Bike) L2 2 min forward, 2 min back ward   ?  ?  Neck Exercises: Supine  ? Upper Extremity D1 Flexion;20 reps;Theraband   ? Theraband Level (UE D1) Level 2 (Red)   ?  ? Neck Exercises: Stretches  ? Other Neck Stretches doorway stretch mid, low, high 2x30 sec   ? Other Neck Stretches anterior scalene stretch x30 sec   ?  ? Shoulder Exercises: Standing  ? Horizontal ABduction Strengthening;20 reps;Theraband   ? Theraband Level (Shoulder Horizontal ABduction) Level 2 (Red)   ? External Rotation Strengthening;20 reps;Theraband   ? Theraband Level (Shoulder External Rotation) Level 2 (Red)   ? Extension Strengthening;20 reps;Theraband   ? Theraband Level (Shoulder Extension) Level 3 (Green)   ? Row Strengthening;Both;20 reps;Theraband   ? Theraband Level (Shoulder Row) Level 3 (Green)   ? Other Standing Exercises "W" with red tband 2x10   ? Other Standing Exercises trialed diagonals in standing; however, pt unable to perform  without UT compensation   ?  ? Traction  ? Type of Traction Cervical   ? Min (lbs) 0   ? Max (lbs) 15   ? Hold Time 60 sec   ? Rest Time 60 sec   ? Time 10 min   ? ?  ?  ? ?  ? ? ? ? ? ? ? ? ? ? ? ? ? ? ? PT Long Term Goals - 03/13/21 1650   ? ?  ? PT LONG TERM GOAL #1  ? Title Pt will be independent with HEP   ? Time 6   ? Period Weeks   ? Status New   ? Target Date 04/24/21   ?  ? PT LONG TERM GOAL #2  ? Title Pt will improve FOTO to >= 70 to demo improved functional mobility   ? Time 6   ? Period Weeks   ? Status New   ? Target Date 04/24/21   ?  ? PT LONG TERM GOAL #3  ? Title Pt will be able to sleep x 6 hours without being awoken by pain   ? Time 6   ? Period Weeks   ? Status New   ? Target Date 04/24/21   ?  ? PT LONG TERM GOAL #4  ? Title pt will tolerate sitting x 30 minutes with symptoms <= 1/10   ? Time 6   ? Period Weeks   ? Status New   ? Target Date 04/24/21   ? ?  ?  ? ?  ? ? ? ? ? ? ? ? Plan - 04/03/21 1650   ? ? Clinical Impression Statement Continued traction this session to provide stretch and alignment. Worked on shoulder/neck postural stabilization. Able to tolerate red tband exercises in standing. Pt unable to perform standing shoulder PNF D2 flexion without UT compensation. Improved in supine.   ? Personal Factors and Comorbidities Time since onset of injury/illness/exacerbation;Profession   ? Examination-Activity Limitations Sleep;Sit;Lift   ? Examination-Participation Restrictions Community Activity;Occupation;Driving   ? Stability/Clinical Decision Making Stable/Uncomplicated   ? Rehab Potential Good   ? PT Frequency 2x / week   ? PT Duration 6 weeks   ? PT Treatment/Interventions Aquatic Therapy;Cryotherapy;Moist Heat;Traction;Iontophoresis 4mg /ml Dexamethasone;Electrical Stimulation;Neuromuscular re-education;Therapeutic exercise;Therapeutic activities;Patient/family education;Manual techniques;Passive range of motion;Dry needling;Taping   ? PT Next Visit Plan assess HEP, how was  mechanical traction, postural strength   ? PT Home Exercise Plan [id number]   ? Consulted and Agree with Plan of Care Patient   ? ?  ?  ? ?  ? ? ?Patient will  benefit from skilled therapeutic intervention in order to improve the following deficits and impairments:  Pain, Postural dysfunction, Decreased strength, Decreased activity tolerance, Decreased range of motion, Impaired sensation, Increased muscle spasms ? ?Visit Diagnosis: ?Radiculopathy, cervical region ? ?Abnormal posture ? ?Muscle weakness (generalized) ? ?Chronic left shoulder pain ? ?Stiffness of left shoulder, not elsewhere classified ? ?Other symptoms and signs involving the musculoskeletal system ? ? ? ? ?Problem List ?Patient Active Problem List  ? Diagnosis Date Noted  ? Numbness and tingling of right arm 12/26/2020  ? Left shoulder pain 08/23/2020  ? Eye exam, routine 10/27/2019  ? Vitamin D deficiency 10/27/2019  ? Hypothyroidism 05/25/2019  ? OSA (obstructive sleep apnea) 04/14/2019  ? Essential hypertension 04/14/2019  ? Hyperlipidemia LDL goal <70 04/14/2019  ? Diabetes mellitus without complication (HCC) 04/14/2019  ? Annual visit for general adult medical examination with abnormal findings 04/14/2019  ? ? ?Elford Evilsizer April Dell PontoMa L Shatonya Passon, PT, DPT ?04/03/2021, 4:53 PM ? ?[place] ?[facility] ?[address] ?[place], [place], [address] ?Phone: [phone]   Fax:  [phone] ? ?Name: Sophia CortiLisa Robbins ?MRN: [id number] ?Date of Birth: 1960-05-17 ? ? ? ?

## 2021-04-08 ENCOUNTER — Other Ambulatory Visit: Payer: Self-pay

## 2021-04-08 ENCOUNTER — Ambulatory Visit: Payer: Federal, State, Local not specified - PPO | Admitting: Physical Therapy

## 2021-04-08 DIAGNOSIS — G8929 Other chronic pain: Secondary | ICD-10-CM

## 2021-04-08 DIAGNOSIS — M5412 Radiculopathy, cervical region: Secondary | ICD-10-CM | POA: Diagnosis not present

## 2021-04-08 DIAGNOSIS — R29898 Other symptoms and signs involving the musculoskeletal system: Secondary | ICD-10-CM

## 2021-04-08 DIAGNOSIS — M25612 Stiffness of left shoulder, not elsewhere classified: Secondary | ICD-10-CM

## 2021-04-08 DIAGNOSIS — M6281 Muscle weakness (generalized): Secondary | ICD-10-CM

## 2021-04-08 DIAGNOSIS — R293 Abnormal posture: Secondary | ICD-10-CM | POA: Diagnosis not present

## 2021-04-08 DIAGNOSIS — M25512 Pain in left shoulder: Secondary | ICD-10-CM | POA: Diagnosis not present

## 2021-04-08 NOTE — Therapy (Signed)
[place] ?[facility] ?[place] ?[place], [place], [address] ?Phone: [phone]   Fax:  [phone] ? ?Physical Therapy Treatment ? ?Patient Details  ?Name: Sophia Robbins ?MRN: [id number] ?Date of Birth: 1960/12/31 ?Referring Provider (PT): ELSNER,HENRY ? ? ?Encounter Date: 04/08/2021 ? ? PT End of Session - 04/08/21 1603   ? ? Visit Number 5   ? Number of Visits 12   ? Date for PT Re-Evaluation 04/24/21   ? Authorization Type BCBS   ? PT Start Time [id number]   ? PT Stop Time [id number]   ? PT Time Calculation (min) 40 min   ? Activity Tolerance Patient tolerated treatment well   ? Behavior During Therapy [facility] for tasks assessed/performed   ? ?  ?  ? ?  ? ? ?Past Medical History:  ?Diagnosis Date  ? Allergy   ? Asthma   ? Diabetes mellitus without complication ([place])   ? Hyperlipidemia   ? OSA on CPAP   ? ? ?Past Surgical History:  ?Procedure Laterality Date  ? APPENDECTOMY    ? BIOPSY  06/26/2020  ? Procedure: BIOPSY;  Surgeon: Harvel Quale, MD;  [facility];  Service: Gastroenterology;;  ? CESAREAN SECTION    ? COLONOSCOPY WITH PROPOFOL N/A 06/26/2020  ? Procedure: COLONOSCOPY WITH PROPOFOL;  Surgeon: Harvel Quale, MD;  [facility];  Service: Gastroenterology;  Laterality: N/A;  9:00  ? ESOPHAGOGASTRODUODENOSCOPY (EGD) WITH PROPOFOL N/A 06/26/2020  ? Procedure: ESOPHAGOGASTRODUODENOSCOPY (EGD) WITH PROPOFOL;  Surgeon: Harvel Quale, MD;  [facility];  Service: Gastroenterology;  Laterality: N/A;  ? EXPLORATION POST OPERATIVE OPEN HEART    ? KNEE ARTHROSCOPY AND ARTHROTOMY Left   ? TUBAL LIGATION    ? ? ?There were no vitals filed for this visit. ? ? Subjective Assessment - 04/08/21 1606   ? ? Subjective Pt states some mild soreness after last session. Pt also notes that after feeling the slight pull in the front of her neck from performing the UBE she could feel it for the next 2 days. Does report bilat hand  numbness after last session driving back home.   ? Limitations Sitting   sleeping  ? How long can you sit comfortably? 10-15 minutes   ? Diagnostic tests MRI: Cervical spine degeneration with up to mild spinal stenosis at  both C5-C6 and C6-C7 related to disc bulging and posterior element  hypertrophy. No spinal cord mass effect or signal abnormality.  Up to moderate degenerative neural foraminal stenosis at the left C5  and left > right C6 nerve levels.   ? Patient Stated Goals decrease the numbness, be able to sleep without pain/symptoms   ? Currently in Pain? Yes   ? Pain Score 5    ? Pain Location Shoulder   ? Pain Orientation Right   ? Pain Descriptors / Indicators Sore   ? Pain Type Chronic pain   ? Pain Onset More than a month ago   ? ?  ?  ? ?  ? ? ? ? ? [facility] PT Assessment - 04/08/21 0001   ? ?  ? Assessment  ? Medical Diagnosis cervical radiculopathy   ? Referring Provider (PT) ELSNER,HENRY   ? Onset Date/Surgical Date 09/20/20   ? Hand Dominance Right   ? ?  ?  ? ?  ? ? ? ? ? ? ? ? ? ? ? ? ? ? ? ? [facility] Adult PT Treatment/Exercise - 04/08/21  0001   ? ?  ? Neck Exercises: Theraband  ? Shoulder Extension 20 reps;Green   ? Rows 10 reps;Green   ? Rows Limitations reactive isometrics   ? Shoulder External Rotation 10 reps;Green;Red   ? Horizontal ABduction 10 reps;Red;Green   ?  ? Neck Exercises: Sidelying  ? Lateral Flexion --   ? Other Sidelying Exercise scapular depression and retraction x20   ? Other Sidelying Exercise shoulder abduction with red tband x10 AAROM x10, shoulder flexion red tband x10 AROM x10   ?  ? Neck Exercises: Stretches  ? Other Neck Stretches doorway stretch mid, low, high 2x30 sec   ? Other Neck Stretches anterior scalene stretch x30 sec   ?  ? Traction  ? Type of Traction Cervical   ? Min (lbs) 0   ? Max (lbs) 15   ? Hold Time 60 sec   ? Rest Time 60 sec   ? Time 10 min   ? ?  ?  ? ?  ? ? ? ? ? ? ? ? ? ? ? ? ? ? ? PT Long Term Goals - 03/13/21 1650   ? ?  ? PT LONG TERM GOAL #1  ? Title  Pt will be independent with HEP   ? Time 6   ? Period Weeks   ? Status New   ? Target Date 04/24/21   ?  ? PT LONG TERM GOAL #2  ? Title Pt will improve FOTO to >= 70 to demo improved functional mobility   ? Time 6   ? Period Weeks   ? Status New   ? Target Date 04/24/21   ?  ? PT LONG TERM GOAL #3  ? Title Pt will be able to sleep x 6 hours without being awoken by pain   ? Time 6   ? Period Weeks   ? Status New   ? Target Date 04/24/21   ?  ? PT LONG TERM GOAL #4  ? Title pt will tolerate sitting x 30 minutes with symptoms <= 1/10   ? Time 6   ? Period Weeks   ? Status New   ? Target Date 04/24/21   ? ?  ?  ? ?  ? ? ? ? ? ? ? ? Plan - 04/08/21 1659   ? ? Clinical Impression Statement Pt with improving ability to keep UTs from elevating during exercises requiring less cues. Advanced pt to green tband where tolerated and PNF D2 flexion into sidelying. Worked on improving pt's scapular control in sidelying (demos shoulder hike with eccentric phase of shoulder elevation).   ? Personal Factors and Comorbidities Time since onset of injury/illness/exacerbation;Profession   ? Examination-Activity Limitations Sleep;Sit;Lift   ? Examination-Participation Restrictions Community Activity;Occupation;Driving   ? Stability/Clinical Decision Making Stable/Uncomplicated   ? Rehab Potential Good   ? PT Frequency 2x / week   ? PT Duration 6 weeks   ? PT Treatment/Interventions Aquatic Therapy;Cryotherapy;Moist Heat;Traction;Iontophoresis 4mg /ml Dexamethasone;Electrical Stimulation;Neuromuscular re-education;Therapeutic exercise;Therapeutic activities;Patient/family education;Manual techniques;Passive range of motion;Dry needling;Taping   ? PT Next Visit Plan assess HEP, manual work/mechanical traction, postural strength   ? PT Home Exercise Plan [id number]   ? Consulted and Agree with Plan of Care Patient   ? ?  ?  ? ?  ? ? ?Patient will benefit from skilled therapeutic intervention in order to improve the following deficits and  impairments:  Pain, Postural dysfunction, Decreased strength, Decreased activity tolerance, Decreased range of motion, Impaired sensation,  Increased muscle spasms ? ?Visit Diagnosis: ?Radiculopathy, cervical region ? ?Abnormal posture ? ?Muscle weakness (generalized) ? ?Chronic left shoulder pain ? ?Stiffness of left shoulder, not elsewhere classified ? ?Other symptoms and signs involving the musculoskeletal system ? ? ? ? ?Problem List ?Patient Active Problem List  ? Diagnosis Date Noted  ? Numbness and tingling of right arm 12/26/2020  ? Left shoulder pain 08/23/2020  ? Eye exam, routine 10/27/2019  ? Vitamin D deficiency 10/27/2019  ? Hypothyroidism 05/25/2019  ? OSA (obstructive sleep apnea) 04/14/2019  ? Essential hypertension 04/14/2019  ? Hyperlipidemia LDL goal <70 04/14/2019  ? Diabetes mellitus without complication ([place]) [id number]  ? Annual visit for general adult medical examination with abnormal findings 04/14/2019  ? ? ?Kenna Seward April Gordy Levan, PT, DPT ?04/08/2021, 5:04 PM ? ?[place] ?[facility] ?[address] ?[place], [place], [address] ?Phone: [phone]   Fax:  [phone] ? ?Name: Sophia Robbins ?MRN: [id number] ?Date of Birth: [birth year] ? ? ? ?

## 2021-04-10 ENCOUNTER — Encounter: Payer: Federal, State, Local not specified - PPO | Admitting: Physical Therapy

## 2021-04-19 ENCOUNTER — Ambulatory Visit: Payer: Federal, State, Local not specified - PPO | Admitting: Physical Therapy

## 2021-04-19 DIAGNOSIS — M6281 Muscle weakness (generalized): Secondary | ICD-10-CM

## 2021-04-19 DIAGNOSIS — R293 Abnormal posture: Secondary | ICD-10-CM

## 2021-04-19 DIAGNOSIS — M25512 Pain in left shoulder: Secondary | ICD-10-CM | POA: Diagnosis not present

## 2021-04-19 DIAGNOSIS — G8929 Other chronic pain: Secondary | ICD-10-CM | POA: Diagnosis not present

## 2021-04-19 DIAGNOSIS — M25612 Stiffness of left shoulder, not elsewhere classified: Secondary | ICD-10-CM | POA: Diagnosis not present

## 2021-04-19 DIAGNOSIS — M5412 Radiculopathy, cervical region: Secondary | ICD-10-CM

## 2021-04-19 DIAGNOSIS — R29898 Other symptoms and signs involving the musculoskeletal system: Secondary | ICD-10-CM | POA: Diagnosis not present

## 2021-04-19 NOTE — Therapy (Signed)
[place] [facility] ?[address] [place] [address] ?[place], [place], [address] ?Phone: [phone]   Fax:  [phone] ? ?Physical Therapy Treatment ? ?Patient Details  ?Name: Sophia Robbins ?MRN: [id number] ?Date of Birth: 14-Jan-1961 ?Referring Provider (PT): ELSNER,HENRY ? ? ?Encounter Date: 04/19/2021 ? ? PT End of Session - 04/19/21 1044   ? ? Visit Number 6   ? Number of Visits 12   ? Date for PT Re-Evaluation 04/24/21   ? Authorization Type BCBS   ? PT Start Time 1015   ? PT Stop Time 1055   ? PT Time Calculation (min) 40 min   ? Activity Tolerance Patient tolerated treatment well   ? Behavior During Therapy [facility] for tasks assessed/performed   ? ?  ?  ? ?  ? ? ?Past Medical History:  ?Diagnosis Date  ? Allergy   ? Asthma   ? Diabetes mellitus without complication (HCC)   ? Hyperlipidemia   ? OSA on CPAP   ? ? ?Past Surgical History:  ?Procedure Laterality Date  ? APPENDECTOMY    ? BIOPSY  06/26/2020  ? Procedure: BIOPSY;  Surgeon: Dolores Frame, MD;  [facility];  Service: Gastroenterology;;  ? CESAREAN SECTION    ? COLONOSCOPY WITH PROPOFOL N/A 06/26/2020  ? Procedure: COLONOSCOPY WITH PROPOFOL;  Surgeon: Dolores Frame, MD;  [facility];  Service: Gastroenterology;  Laterality: N/A;  9:00  ? ESOPHAGOGASTRODUODENOSCOPY (EGD) WITH PROPOFOL N/A 06/26/2020  ? Procedure: ESOPHAGOGASTRODUODENOSCOPY (EGD) WITH PROPOFOL;  Surgeon: Dolores Frame, MD;  [facility];  Service: Gastroenterology;  Laterality: N/A;  ? EXPLORATION POST OPERATIVE OPEN HEART    ? KNEE ARTHROSCOPY AND ARTHROTOMY Left   ? TUBAL LIGATION    ? ? ?There were no vitals filed for this visit. ? ? Subjective Assessment - 04/19/21 1023   ? ? Subjective Pt states she does not have any pain today, "just a crick in my neck"   ? Patient Stated Goals decrease the numbness, be able to sleep without pain/symptoms   ? Currently in Pain? No/denies   ? ?  ?  ? ?   ? ? ? ? ? [facility] PT Assessment - 04/19/21 0001   ? ?  ? Assessment  ? Medical Diagnosis cervical radiculopathy   ? Referring Provider (PT) ELSNER,HENRY   ? Onset Date/Surgical Date 09/20/20   ? Hand Dominance Right   ?  ? AROM  ? Cervical - Right Side Bend 44   ? Cervical - Left Side Bend 40   ? Cervical - Right Rotation 65   ? Cervical - Left Rotation 65   ? ?  ?  ? ?  ? ? ? ? ? ? ? ? ? ? ? ? ? ? ? ? [facility] Adult PT Treatment/Exercise - 04/19/21 0001   ? ?  ? Neck Exercises: Theraband  ? Shoulder Extension 20 reps;Green   ? Rows 20 reps;Green   ? Shoulder External Rotation Green;20 reps   ? Horizontal ABduction Green;20 reps   ?  ? Neck Exercises: Standing  ? Other Standing Exercises serratus wall slide x 10, wall clock red TB x 10 bilat   ?  ? Neck Exercises: Stretches  ? Other Neck Stretches doorway stretch mid, low, high 2x30 sec   ? Other Neck Stretches anterior scalene stretch x30 sec   ?  ? Traction  ? Type of Traction Cervical   ? Min (lbs) 0   ?  Max (lbs) 15   ? Hold Time 60 sec   ? Rest Time 60 sec   ? Time 10 min   ? ?  ?  ? ?  ? ? ? ? ? ? ? ? ? ? ? ? ? ? ? PT Long Term Goals - 03/13/21 1650   ? ?  ? PT LONG TERM GOAL #1  ? Title Pt will be independent with HEP   ? Time 6   ? Period Weeks   ? Status New   ? Target Date 04/24/21   ?  ? PT LONG TERM GOAL #2  ? Title Pt will improve FOTO to >= 70 to demo improved functional mobility   ? Time 6   ? Period Weeks   ? Status New   ? Target Date 04/24/21   ?  ? PT LONG TERM GOAL #3  ? Title Pt will be able to sleep x 6 hours without being awoken by pain   ? Time 6   ? Period Weeks   ? Status New   ? Target Date 04/24/21   ?  ? PT LONG TERM GOAL #4  ? Title pt will tolerate sitting x 30 minutes with symptoms <= 1/10   ? Time 6   ? Period Weeks   ? Status New   ? Target Date 04/24/21   ? ?  ?  ? ?  ? ? ? ? ? ? ? ? Plan - 04/19/21 1046   ? ? Clinical Impression Statement Pt with good tolerance to postural strength this visit. Pt able to self correct over recruitment of  upper traps without cuing. Added serratus wall slide and scap clock with no c/o pain. Pt still with good response to traction.   ? PT Next Visit Plan RECERT (pt returns to MD in May), postural strength, traction   ? PT Home Exercise Plan [id number]   ? Consulted and Agree with Plan of Care Patient   ? ?  ?  ? ?  ? ? ?Patient will benefit from skilled therapeutic intervention in order to improve the following deficits and impairments:    ? ?Visit Diagnosis: ?Radiculopathy, cervical region ? ?Abnormal posture ? ?Muscle weakness (generalized) ? ? ? ? ?Problem List ?Patient Active Problem List  ? Diagnosis Date Noted  ? Numbness and tingling of right arm 12/26/2020  ? Left shoulder pain 08/23/2020  ? Eye exam, routine 10/27/2019  ? Vitamin D deficiency 10/27/2019  ? Hypothyroidism 05/25/2019  ? OSA (obstructive sleep apnea) 04/14/2019  ? Essential hypertension 04/14/2019  ? Hyperlipidemia LDL goal <70 04/14/2019  ? Diabetes mellitus without complication (HCC) 04/14/2019  ? Annual visit for general adult medical examination with abnormal findings 04/14/2019  ? ? ?Hamid Brookens, PT ?04/19/2021, 10:49 AM ? ?[place] ?[facility] ?[address] [place] [address] ?[place], [place], [address] ?Phone: [phone]   Fax:  [phone] ? ?Name: Sophia Robbins ?MRN: [id number] ?Date of Birth: [birth year] ? ? ? ?

## 2021-04-25 ENCOUNTER — Ambulatory Visit: Payer: Federal, State, Local not specified - PPO | Attending: Neurological Surgery | Admitting: Physical Therapy

## 2021-04-25 DIAGNOSIS — M6281 Muscle weakness (generalized): Secondary | ICD-10-CM | POA: Insufficient documentation

## 2021-04-25 DIAGNOSIS — M25512 Pain in left shoulder: Secondary | ICD-10-CM | POA: Diagnosis not present

## 2021-04-25 DIAGNOSIS — R293 Abnormal posture: Secondary | ICD-10-CM | POA: Diagnosis not present

## 2021-04-25 DIAGNOSIS — M25612 Stiffness of left shoulder, not elsewhere classified: Secondary | ICD-10-CM | POA: Diagnosis not present

## 2021-04-25 DIAGNOSIS — G8929 Other chronic pain: Secondary | ICD-10-CM | POA: Insufficient documentation

## 2021-04-25 DIAGNOSIS — R29898 Other symptoms and signs involving the musculoskeletal system: Secondary | ICD-10-CM | POA: Diagnosis not present

## 2021-04-25 DIAGNOSIS — M5412 Radiculopathy, cervical region: Secondary | ICD-10-CM | POA: Diagnosis not present

## 2021-04-25 NOTE — Therapy (Addendum)
[place], [place], [address] Phone: [phone]   Fax:  [phone]  Physical Therapy Treatment and Discharge  Patient Details  Name: Sophia Robbins MRN: [id number] Date of Birth: 1960-02-06 Referring Provider (PT): ELSNER,HENRY  PHYSICAL THERAPY DISCHARGE SUMMARY  Visits from Start of Care: 7  Current functional level related to goals / functional outcomes: See below   Remaining deficits: See below   Education / Equipment: See below   Patient agrees to discharge. Patient goals were met. Patient is being discharged due to being pleased with the current functional level.  Encounter Date: 04/25/2021   PT End of Session - 04/25/21 1616     Visit Number 7    Number of Visits 13    Date for PT Re-Evaluation 06/06/21    Authorization Type BCBS    PT Start Time 1616    PT Stop Time 1700    PT Time Calculation (min) 44 min    Activity Tolerance Patient tolerated treatment well    Behavior During Therapy WFL for tasks assessed/performed             Past Medical History:  Diagnosis Date   Allergy    Asthma    Diabetes mellitus without complication ([place])    Hyperlipidemia    OSA on CPAP     Past Surgical History:  Procedure Laterality Date   APPENDECTOMY     BIOPSY  06/26/2020   Procedure: BIOPSY;  Surgeon: Harvel Quale, MD;  [facility];  Service: Gastroenterology;;   CESAREAN SECTION     COLONOSCOPY WITH PROPOFOL N/A 06/26/2020   Procedure: COLONOSCOPY WITH PROPOFOL;  Surgeon: Harvel Quale, MD;  [facility];  Service: Gastroenterology;  Laterality: N/A;  9:00   ESOPHAGOGASTRODUODENOSCOPY (EGD) WITH PROPOFOL N/A 06/26/2020   Procedure: ESOPHAGOGASTRODUODENOSCOPY (EGD) WITH PROPOFOL;  Surgeon: Harvel Quale, MD;  [facility];  Service: Gastroenterology;  Laterality: N/A;   EXPLORATION POST OPERATIVE OPEN HEART      KNEE ARTHROSCOPY AND ARTHROTOMY Left    TUBAL LIGATION      There were no vitals filed for this visit.   Subjective Assessment - 04/25/21 1616     Subjective Pt reports she's been doing her exercises. Pt states it has helped her sleep. Pt notes that her arm soreness is a lot better.    Limitations Sitting    How long can you sit comfortably? 10-15 minutes    Diagnostic tests MRI: Cervical spine degeneration with up to mild spinal stenosis at  both C5-C6 and C6-C7 related to disc bulging and posterior element  hypertrophy. No spinal cord mass effect or signal abnormality.  Up to moderate degenerative neural foraminal stenosis at the left C5  and left > right C6 nerve levels.    Patient Stated Goals decrease the numbness, be able to sleep without pain/symptoms    Currently in Pain? Yes    Pain Score 2     Pain Location Shoulder    Pain Orientation Right    Pain Descriptors / Indicators Sore    Pain Type Chronic pain                [facility] PT Assessment - 04/25/21 0001       Observation/Other Assessments   Focus on Therapeutic Outcomes (FOTO)  76      Strength   Right Shoulder Flexion 4+/5    Right Shoulder ABduction 4+/5  Right Shoulder Internal Rotation 5/5    Right Shoulder External Rotation 5/5    Left Shoulder Flexion 4+/5    Left Shoulder ABduction 4+/5    Left Shoulder Internal Rotation 5/5    Left Shoulder External Rotation 5/5      Palpation   Palpation comment Mildly tender in UTs but otherwise no gross spasticity felt in suboccipitals, anterior scalenes, or cervical paraspinals                           [facility] Adult PT Treatment/Exercise - 04/25/21 0001       Neck Exercises: Theraband   Other Theraband Exercises green tband bow & arrow 2x10      Neck Exercises: Standing   Other Standing Exercises serratus wall slide 2x 10, wall clock red TB x 10 bilat      Neck Exercises: Seated   Neck Retraction 10 reps;3 secs    Neck Retraction  Limitations red tband      Neck Exercises: Stretches   Neck Stretch 30 seconds                          PT Long Term Goals - 04/25/21 1618       PT LONG TERM GOAL #1   Title Pt will be independent with HEP    Time 6    Period Weeks    Status On-going    Target Date 06/06/21      PT LONG TERM GOAL #2   Title Pt will improve FOTO to >= 70 to demo improved functional mobility    Time 6    Period Weeks    Status Achieved    Target Date 04/24/21      PT LONG TERM GOAL #3   Title Pt will be able to sleep x 6 hours without being awoken by pain    Time 6    Period Weeks    Status Achieved    Target Date 04/24/21      PT LONG TERM GOAL #4   Title pt will tolerate sitting x 30 minutes with symptoms <= 1/10    Baseline will at times get some radiating arm symptoms during driving    Time 6    Period Weeks    Status Partially Met    Target Date 06/06/21                   Plan - 04/25/21 1638     Clinical Impression Statement Rechecked pt's goals -- she has met most of her LTGs at this time. Pt feels she has been doing well with therapy but would like to wait to close out her chart until she sees her MD in May. Would like to see PT as needed within that time frame if any issues with her HEP or any exacerbations. She has continued N/T in her R 3rd and 4th finger but otherwise pain has greatly improved and she is able to sleep with less arm pain. Will re-cert until May at this time for any issues.    Personal Factors and Comorbidities Time since onset of injury/illness/exacerbation;Profession    Examination-Activity Limitations Sleep;Sit;Lift    Examination-Participation Restrictions Community Activity;Occupation;Driving    Rehab Potential Good    PT Frequency 1x / week   as needed   PT Duration 6 weeks    PT Treatment/Interventions Aquatic Therapy;Cryotherapy;Moist Heat;Traction;Iontophoresis 29m/ml Dexamethasone;Electrical Stimulation;Neuromuscular  re-education;Therapeutic exercise;Therapeutic activities;Patient/family education;Manual techniques;Passive range of motion;Dry needling;Taping    PT Next Visit Plan postural strength, traction    PT Home Exercise Plan [id number]    Consulted and Agree with Plan of Care Patient             Patient will benefit from skilled therapeutic intervention in order to improve the following deficits and impairments:  Pain, Postural dysfunction, Decreased strength, Decreased activity tolerance, Decreased range of motion, Impaired sensation, Increased muscle spasms  Visit Diagnosis: Radiculopathy, cervical region  Abnormal posture  Muscle weakness (generalized)  Chronic left shoulder pain  Stiffness of left shoulder, not elsewhere classified  Other symptoms and signs involving the musculoskeletal system     Problem List Patient Active Problem List   Diagnosis Date Noted   Numbness and tingling of right arm 12/26/2020   Left shoulder pain 08/23/2020   Eye exam, routine 10/27/2019   Vitamin D deficiency 10/27/2019   Hypothyroidism 05/25/2019   OSA (obstructive sleep apnea) 04/14/2019   Essential hypertension 04/14/2019   Hyperlipidemia LDL goal <70 04/14/2019   Diabetes mellitus without complication ([place]) 71/82/0990   Annual visit for general adult medical examination with abnormal findings 04/14/2019    [facility] Gordy Levan, PT, DPT 04/25/2021, 5:01 PM  [facility] [place], [place], [address] Phone: [phone]   Fax:  [phone]  Name: Sophia Robbins MRN: [id number] Date of Birth: [birth year]

## 2021-05-03 ENCOUNTER — Ambulatory Visit: Payer: Federal, State, Local not specified - PPO | Admitting: Nurse Practitioner

## 2021-05-03 ENCOUNTER — Encounter: Payer: Self-pay | Admitting: Nurse Practitioner

## 2021-05-03 VITALS — BP 124/62 | HR 60 | Ht 63.0 in | Wt 158.0 lb

## 2021-05-03 DIAGNOSIS — Z1231 Encounter for screening mammogram for malignant neoplasm of breast: Secondary | ICD-10-CM

## 2021-05-03 DIAGNOSIS — E119 Type 2 diabetes mellitus without complications: Secondary | ICD-10-CM

## 2021-05-03 DIAGNOSIS — E039 Hypothyroidism, unspecified: Secondary | ICD-10-CM

## 2021-05-03 DIAGNOSIS — Z23 Encounter for immunization: Secondary | ICD-10-CM | POA: Diagnosis not present

## 2021-05-03 DIAGNOSIS — E785 Hyperlipidemia, unspecified: Secondary | ICD-10-CM

## 2021-05-03 DIAGNOSIS — I10 Essential (primary) hypertension: Secondary | ICD-10-CM

## 2021-05-03 DIAGNOSIS — E118 Type 2 diabetes mellitus with unspecified complications: Secondary | ICD-10-CM

## 2021-05-03 MED ORDER — LEVOTHYROXINE SODIUM 25 MCG PO TABS
50.0000 ug | ORAL_TABLET | Freq: Every day | ORAL | 0 refills | Status: DC
Start: 2021-05-03 — End: 2021-08-15

## 2021-05-03 MED ORDER — OZEMPIC (1 MG/DOSE) 2 MG/1.5ML ~~LOC~~ SOPN: 1.0000 mg | PEN_INJECTOR | SUBCUTANEOUS | 1 refills | Status: DC

## 2021-05-03 MED ORDER — REPATHA SURECLICK 140 MG/ML ~~LOC~~ SOAJ: 140.0000 mg | SUBCUTANEOUS | 11 refills | Status: DC

## 2021-05-03 MED ORDER — METFORMIN HCL 500 MG PO TABS: 500.0000 mg | ORAL_TABLET | Freq: Two times a day (BID) | ORAL | 0 refills | Status: DC

## 2021-05-03 NOTE — Patient Instructions (Addendum)
Please get your shingles and TDAP vaccine today .  ?Please stop taking metformin until we get your fasting blood work done. ? ? ?It is important that you exercise regularly at least 30 minutes 5 times a week.  ?Think about what you will eat, plan ahead. ?Choose " clean, green, fresh or frozen" over canned, processed or packaged foods which are more sugary, salty and fatty. ?70 to 75% of food eaten should be vegetables and fruit. ?Three meals at set times with snacks allowed between meals, but they must be fruit or vegetables. ?Aim to eat over a 12 hour period , example 7 am to 7 pm, and STOP after  your last meal of the day. ?Drink water,generally about 64 ounces per day, no other drink is as healthy. Fruit juice is best enjoyed in a healthy way, by EATING the fruit. ? ?Thanks for choosing [facility], we consider it a privelige to serve you. ? ?

## 2021-05-03 NOTE — Progress Notes (Signed)
? ?  Sophia Robbins     MRN: [id number]      DOB: Jan 04, 1961 ? ? ?HPI ?Sophia Robbins with past medical history of essential hypertension, obstructive sleep apnea, diabetes mellitus without complication, hypothyroidism, hyperlipidemia is here for follow up and medication refill. ?Patient denies any adverse reactions to current medication, states that she has been taking all medications as prescribed.  ? ?Reports Cbg of 90-105 at home patient denies any hypoglycemic episodes on Ozempic 1 mg injection once weekly, metformin 500 mg twice daily.  ? ?Patient not able to get blood work done today because she has a eating, patient encouraged to get her blood work done next week, she verbalized understanding. ? ? ?ROS ?Denies recent fever or chills. ?Denies sinus pressure, nasal congestion, ear pain or sore throat. ?Denies chest congestion, productive cough or wheezing. ?Denies chest pains, palpitations and leg swelling ?Denies abdominal pain, nausea, vomiting,diarrhea or constipation.   ?Denies dysuria, frequency, hesitancy or incontinence. ?Denies joint pain, swelling and limitation in mobility. ?Denies depression, anxiety or insomnia. ? ? ? ?PE ? ?BP 124/62 (BP Location: Right Arm, Cuff Size: Normal)   Pulse 60   Ht $R'5\' 3"'dm$  (1.6 m)   Wt 158 lb (71.7 kg)   SpO2 96%   BMI 27.99 kg/m?  ? ?Patient alert and oriented and in no cardiopulmonary distress. ? ?Chest: Clear to auscultation bilaterally. ? ?CVS: S1, S2,  murmurs, no S3.Regular rate. ? ?ABD: Soft non tender.  ? ?Ext: No edema ? ?MS: Adequate ROM spine, shoulders, hips and knees. ? ?Psych: Good eye contact, normal affect. Memory intact not anxious or depressed appearing. ? ? ? ? ?Assessment & Plan ? ?Essential hypertension ?BP Readings from Last 3 Encounters:  ?05/03/21 124/62  ?12/26/20 131/74  ?10/30/20 131/69  ?Chronic condition well-controlled on losartan 100 mg daily ?DASH diet advised, exercise regularly at least 150 minutes weekly ?CMP plus EGFR  today ? ?Hypothyroidism ?Takes levothyroxine 50 mcg daily ?Check labs  ? ?Hyperlipidemia LDL goal <70 ?On is a 10-day 10 mg daily, Repatha 140 mg every 2 weeks injection ?Check lipid panel  ? ?Controlled diabetes mellitus type 2 with complications (Sophia Robbins) ?Lab Results  ?Component Value Date  ? HGBA1C 5.3 08/23/2020  ?On metformin 500 mg twice daily, Ozempic 1 mg weekly ?lastA1C was normal , not sure why pt is till taking both metformin and ozempic.  Patient told to stop taking metformin for now until we get her labs.  Denies hypoglycemic episode ?Check A1c  ?Urine creatinine clearance ordered.  ? ?Need for varicella vaccine ?Patient educated on CDC recommendation for the vaccine. Verbal consent was obtained from the patient, vaccine administered by nurse, no sign of adverse reactions noted at this time. Patient education on arm soreness and use of tylenol or this patient  was discussed. Patient educated on the signs and symptoms of adverse effect and advise to contact the office if they occur.  ? ?Need for Tdap vaccination ?Patient educated on CDC recommendation for the vaccine. Verbal consent was obtained from the patient, vaccine administered by nurse, no sign of adverse reactions noted at this time. Patient education on arm soreness and use of tylenolfor this patient  was discussed. Patient educated on the signs and symptoms of adverse effect and advise to contact the office if they occur.   ?

## 2021-05-03 NOTE — Assessment & Plan Note (Addendum)
Lab Results  ?Component Value Date  ? HGBA1C 5.3 08/23/2020  ?On metformin 500 mg twice daily, Ozempic 1 mg weekly ?lastA1C was normal , not sure why pt is till taking both metformin and ozempic.  Patient told to stop taking metformin for now until we get her labs.  Denies hypoglycemic episode ?Check A1c  ?Urine creatinine clearance ordered.  ?

## 2021-05-03 NOTE — Assessment & Plan Note (Signed)
BP Readings from Last 3 Encounters:  ?05/03/21 124/62  ?12/26/20 131/74  ?10/30/20 131/69  ?Chronic condition well-controlled on losartan 100 mg daily ?DASH diet advised, exercise regularly at least 150 minutes weekly ?CMP plus EGFR today ?

## 2021-05-03 NOTE — Assessment & Plan Note (Signed)
Patient educated on CDC recommendation for the vaccine. Verbal consent was obtained from the patient, vaccine administered by nurse, no sign of adverse reactions noted at this time. Patient education on arm soreness and use of tylenol or this patient  was discussed. Patient educated on the signs and symptoms of adverse effect and advise to contact the office if they occur.  ?

## 2021-05-03 NOTE — Assessment & Plan Note (Addendum)
On is a 10-day 10 mg daily, Repatha 140 mg every 2 weeks injection ?Check lipid panel  ?

## 2021-05-03 NOTE — Assessment & Plan Note (Signed)
Patient educated on CDC recommendation for the vaccine. Verbal consent was obtained from the patient, vaccine administered by nurse, no sign of adverse reactions noted at this time. Patient education on arm soreness and use of tylenol  for this patient  was discussed. Patient educated on the signs and symptoms of adverse effect and advise to contact the office if they occur.  ?

## 2021-05-03 NOTE — Assessment & Plan Note (Addendum)
Takes levothyroxine 50 mcg daily ?Check labs  ?

## 2021-05-06 ENCOUNTER — Ambulatory Visit (HOSPITAL_COMMUNITY)
Admission: RE | Admit: 2021-05-06 | Discharge: 2021-05-06 | Disposition: A | Discharge status: Home / Self Care | Payer: Federal, State, Local not specified - PPO | Source: Ambulatory Visit | Attending: Nurse Practitioner | Admitting: Nurse Practitioner

## 2021-05-06 DIAGNOSIS — Z1231 Encounter for screening mammogram for malignant neoplasm of breast: Secondary | ICD-10-CM | POA: Diagnosis not present

## 2021-05-07 NOTE — Progress Notes (Signed)
In the right axilla, a possible mass warrants further evaluation. In ?the left breast, no findings suspicious for malignancy. ? ?Radiology will contact patient for further testing

## 2021-05-08 ENCOUNTER — Other Ambulatory Visit (HOSPITAL_COMMUNITY): Payer: Self-pay | Admitting: Nurse Practitioner

## 2021-05-08 DIAGNOSIS — R928 Other abnormal and inconclusive findings on diagnostic imaging of breast: Secondary | ICD-10-CM

## 2021-05-08 DIAGNOSIS — E039 Hypothyroidism, unspecified: Secondary | ICD-10-CM | POA: Diagnosis not present

## 2021-05-08 DIAGNOSIS — E785 Hyperlipidemia, unspecified: Secondary | ICD-10-CM | POA: Diagnosis not present

## 2021-05-08 DIAGNOSIS — I10 Essential (primary) hypertension: Secondary | ICD-10-CM | POA: Diagnosis not present

## 2021-05-08 DIAGNOSIS — E119 Type 2 diabetes mellitus without complications: Secondary | ICD-10-CM | POA: Diagnosis not present

## 2021-05-09 ENCOUNTER — Other Ambulatory Visit: Payer: Self-pay | Admitting: Nurse Practitioner

## 2021-05-09 NOTE — Progress Notes (Signed)
Please review results with patient ?A1c is normal, patient should stop taking Ozempic, she should continue other medication, avoid sugar sweets soda ?Thyroid function is normal ?Lipid panel is normal.

## 2021-05-10 ENCOUNTER — Telehealth: Payer: Self-pay

## 2021-05-10 LAB — MICROALBUMIN / CREATININE URINE RATIO
Creatinine, Urine: 79.3 mg/dL
Microalb/Creat Ratio: <4 mg/g creat (ref 0–29)
Microalbumin, Urine: <3 ug/mL

## 2021-05-10 LAB — CMP14+EGFR
ALT: 18 IU/L (ref 0–32)
AST: 21 IU/L (ref 0–40)
Albumin/Globulin Ratio: 2 (ref 1.2–2.2)
Albumin: 4.7 g/dL (ref 3.8–4.8)
Alkaline Phosphatase: 93 IU/L (ref 44–121)
BUN/Creatinine Ratio: 15 (ref 12–28)
BUN: 13 mg/dL (ref 8–27)
Bilirubin Total: 0.4 mg/dL (ref 0.0–1.2)
CO2: 23 mmol/L (ref 20–29)
Calcium: 9.8 mg/dL (ref 8.7–10.3)
Chloride: 106 mmol/L (ref 96–106)
Creatinine, Ser: 0.88 mg/dL (ref 0.57–1.00)
Globulin, Total: 2.3 g/dL (ref 1.5–4.5)
Glucose: 103 mg/dL — ABNORMAL HIGH (ref 70–99)
Potassium: 4.6 mmol/L (ref 3.5–5.2)
Sodium: 145 mmol/L — ABNORMAL HIGH (ref 134–144)
Total Protein: 7 g/dL (ref 6.0–8.5)
eGFR: 74 mL/min/{1.73_m2} (ref >59)

## 2021-05-10 LAB — LIPID PANEL
Chol/HDL Ratio: 1.9 ratio (ref 0.0–4.4)
Cholesterol, Total: 130 mg/dL (ref 100–199)
HDL: 67 mg/dL (ref >39)
LDL Chol Calc (NIH): 44 mg/dL (ref 0–99)
Triglycerides: 105 mg/dL (ref 0–149)
VLDL Cholesterol Cal: 19 mg/dL (ref 5–40)

## 2021-05-10 LAB — TSH: TSH: 3.78 u[IU]/mL (ref 0.450–4.500)

## 2021-05-10 LAB — HEMOGLOBIN A1C
Est. average glucose Bld gHb Est-mCnc: 111 mg/dL
Hgb A1c MFr Bld: 5.5 % (ref 4.8–5.6)

## 2021-05-10 LAB — CBC
Hematocrit: 40.3 % (ref 34.0–46.6)
Hemoglobin: 14.2 g/dL (ref 11.1–15.9)
MCH: 31.4 pg (ref 26.6–33.0)
MCHC: 35.2 g/dL (ref 31.5–35.7)
MCV: 89 fL (ref 79–97)
Platelets: 243 10*3/uL (ref 150–450)
RBC: 4.52 x10E6/uL (ref 3.77–5.28)
RDW: 12.3 % (ref 11.7–15.4)
WBC: 3.7 10*3/uL (ref 3.4–10.8)

## 2021-05-14 ENCOUNTER — Other Ambulatory Visit (HOSPITAL_COMMUNITY): Payer: Self-pay | Admitting: Nurse Practitioner

## 2021-05-14 ENCOUNTER — Ambulatory Visit (HOSPITAL_COMMUNITY)
Admission: RE | Admit: 2021-05-14 | Discharge: 2021-05-14 | Disposition: A | Discharge status: Home / Self Care | Payer: Federal, State, Local not specified - PPO | Source: Ambulatory Visit | Attending: Nurse Practitioner | Admitting: Nurse Practitioner

## 2021-05-14 DIAGNOSIS — R928 Other abnormal and inconclusive findings on diagnostic imaging of breast: Secondary | ICD-10-CM

## 2021-05-14 DIAGNOSIS — N6489 Other specified disorders of breast: Secondary | ICD-10-CM | POA: Diagnosis not present

## 2021-05-15 ENCOUNTER — Telehealth: Payer: Self-pay | Admitting: Nurse Practitioner

## 2021-05-15 NOTE — Telephone Encounter (Signed)
Patient called in regard to recent mammo ? ?Unusual lymphs were found on R side ?Patient would like to know in which arm she had her most recent vaccines in appt date 4/14. Mammo finding could be a result of vaccines. ? ? ?Patient would like a call back in regard. ?

## 2021-05-16 NOTE — Telephone Encounter (Signed)
Called pt back no answer left vm 

## 2021-05-17 ENCOUNTER — Other Ambulatory Visit: Payer: Self-pay | Admitting: Nurse Practitioner

## 2021-05-17 NOTE — Telephone Encounter (Signed)
Called pt no answer left vm 

## 2021-05-20 ENCOUNTER — Other Ambulatory Visit (HOSPITAL_COMMUNITY): Payer: Self-pay

## 2021-05-20 MED ORDER — CLOBETASOL PROPIONATE 0.05 % EX OINT
1.0000 | TOPICAL_OINTMENT | Freq: Two times a day (BID) | CUTANEOUS | 1 refills | Status: AC | PRN
  Filled 2021-05-20: qty 30, 15d supply, fill #0

## 2021-05-20 NOTE — Telephone Encounter (Signed)
Patient was returning this call from last week, sorry for missing the call. Calling about mammogram.  ?Call back  # [phone] ?

## 2021-05-21 ENCOUNTER — Telehealth: Payer: Self-pay | Admitting: Nurse Practitioner

## 2021-05-21 NOTE — Telephone Encounter (Addendum)
Patient return call per last tele. Can leave detailed message on answering machine. ?

## 2021-05-21 NOTE — Telephone Encounter (Signed)
Called pt no answer left vm 

## 2021-05-22 NOTE — Telephone Encounter (Signed)
Spoke with pt already.

## 2021-05-29 DIAGNOSIS — M5412 Radiculopathy, cervical region: Secondary | ICD-10-CM | POA: Diagnosis not present

## 2021-06-11 ENCOUNTER — Other Ambulatory Visit (HOSPITAL_COMMUNITY): Payer: Self-pay | Admitting: Nurse Practitioner

## 2021-06-11 ENCOUNTER — Ambulatory Visit (HOSPITAL_COMMUNITY)
Admission: RE | Admit: 2021-06-11 | Discharge: 2021-06-11 | Disposition: A | Discharge status: Home / Self Care | Payer: Federal, State, Local not specified - PPO | Source: Ambulatory Visit | Attending: Nurse Practitioner | Admitting: Nurse Practitioner

## 2021-06-11 DIAGNOSIS — R928 Other abnormal and inconclusive findings on diagnostic imaging of breast: Secondary | ICD-10-CM | POA: Diagnosis not present

## 2021-06-11 DIAGNOSIS — N6489 Other specified disorders of breast: Secondary | ICD-10-CM | POA: Diagnosis not present

## 2021-06-11 MED ORDER — LIDOCAINE-EPINEPHRINE (PF) 1 %-1:200000 IJ SOLN
INTRAMUSCULAR | Status: AC
  Filled 2021-06-11: qty 30

## 2021-06-11 MED ORDER — LIDOCAINE HCL (PF) 2 % IJ SOLN
INTRAMUSCULAR | Status: AC
  Filled 2021-06-11: qty 10

## 2021-08-14 ENCOUNTER — Other Ambulatory Visit: Payer: Self-pay | Admitting: Nurse Practitioner

## 2021-08-14 DIAGNOSIS — E039 Hypothyroidism, unspecified: Secondary | ICD-10-CM

## 2021-08-15 ENCOUNTER — Other Ambulatory Visit: Payer: Self-pay | Admitting: Nurse Practitioner

## 2021-08-15 DIAGNOSIS — E039 Hypothyroidism, unspecified: Secondary | ICD-10-CM

## 2021-08-15 MED ORDER — LEVOTHYROXINE SODIUM 25 MCG PO TABS: 50.0000 ug | ORAL_TABLET | Freq: Every day | ORAL | 0 refills | Status: DC

## 2021-09-04 ENCOUNTER — Ambulatory Visit (INDEPENDENT_AMBULATORY_CARE_PROVIDER_SITE_OTHER): Payer: Federal, State, Local not specified - PPO | Admitting: Internal Medicine

## 2021-09-04 ENCOUNTER — Encounter: Payer: Self-pay | Admitting: Internal Medicine

## 2021-09-04 DIAGNOSIS — J0100 Acute maxillary sinusitis, unspecified: Secondary | ICD-10-CM

## 2021-09-04 MED ORDER — AMOXICILLIN-POT CLAVULANATE 875-125 MG PO TABS: 1.0000 | ORAL_TABLET | Freq: Two times a day (BID) | ORAL | 0 refills | Status: DC

## 2021-09-04 MED ORDER — BENZONATATE 100 MG PO CAPS: 100.0000 mg | ORAL_CAPSULE | Freq: Two times a day (BID) | ORAL | 0 refills | Status: DC | PRN

## 2021-09-04 NOTE — Progress Notes (Signed)
Virtual Visit via Telephone Note   This visit type was conducted due to national recommendations for restrictions regarding the COVID-19 Pandemic (e.g. social distancing) in an effort to limit this patient's exposure and mitigate transmission in our community.  Due to her co-morbid illnesses, this patient is at least at moderate risk for complications without adequate follow up.  This format is felt to be most appropriate for this patient at this time.  The patient did not have access to video technology/had technical difficulties with video requiring transitioning to audio format only (telephone).  All issues noted in this document were discussed and addressed.  No physical exam could be performed with this format.  Evaluation Performed:  Follow-up visit  Date:  09/04/2021   ID:  Sophia Robbins, DOB 1960/06/11, MRN [id number]  Patient Location: Home Provider Location: Office/Clinic  Participants: Patient Location of Patient: Home Location of Provider: Telehealth Consent was obtain for visit to be over via telehealth. I verified that I am speaking with the correct person using two identifiers.  PCP:  Donell Beers, FNP   Chief Complaint: Nasal congestion, cough and sinus pressure  History of Present Illness:    Sophia Robbins is a 61 y.o. female who has a televisit for complaint of nasal congestion, sinus pressure, cough, fever and sore throat for the last 1 week.  She also reports sinus pressure related headache and left sided facial pain.  She took home COVID test, which was negative.  He has tried Mucinex with minimal relief.  Her husband also has similar symptoms.  The patient does have symptoms concerning for COVID-19 infection (fever, chills, cough, or new shortness of breath).   Past Medical, Surgical, Social History, Allergies, and Medications have been Reviewed.  Past Medical History:  Diagnosis Date   Allergy    Asthma    Diabetes mellitus without complication  (HCC)    Hyperlipidemia    OSA on CPAP    Past Surgical History:  Procedure Laterality Date   APPENDECTOMY     BIOPSY  06/26/2020   Procedure: BIOPSY;  Surgeon: Dolores Frame, MD;  [facility]: [facility];  Service: Gastroenterology;;   CESAREAN SECTION     COLONOSCOPY WITH PROPOFOL N/A 06/26/2020   Procedure: COLONOSCOPY WITH PROPOFOL;  Surgeon: Dolores Frame, MD;  Location: [facility];  Service: Gastroenterology;  Laterality: N/A;  9:00   ESOPHAGOGASTRODUODENOSCOPY (EGD) WITH PROPOFOL N/A 06/26/2020   Procedure: ESOPHAGOGASTRODUODENOSCOPY (EGD) WITH PROPOFOL;  Surgeon: Dolores Frame, MD;  Location: [facility];  Service: Gastroenterology;  Laterality: N/A;   EXPLORATION POST OPERATIVE OPEN HEART     KNEE ARTHROSCOPY AND ARTHROTOMY Left    TUBAL LIGATION       Current Meds  Medication Sig   amLODipine (NORVASC) 5 MG tablet Take 1 tablet (5 mg total) by mouth daily.   aspirin EC 81 MG tablet Take 81 mg by mouth daily.   cetirizine (ZYRTEC) 10 MG tablet Take 10 mg by mouth daily.   clobetasol ointment (TEMOVATE) 0.05 % Apply topically 2 times daily as needed (Skin Rash).   Evolocumab (REPATHA SURECLICK) 140 MG/ML SOAJ Inject 140 mg into the skin every 14 (fourteen) days.   ezetimibe (ZETIA) 10 MG tablet Take 1 tablet (10 mg total) by mouth daily.   fluticasone (FLOVENT HFA) 110 MCG/ACT inhaler Inhale 2 puffs into the lungs daily as needed (Shortness of breath).   gabapentin (NEURONTIN) 300 MG capsule Take 300 mg by mouth 2 (two)  times daily.   glucose blood test strip Use as instructed   levothyroxine (SYNTHROID) 25 MCG tablet TAKE 2 TABLETS (50 MCG TOTAL) BY MOUTH DAILY BEFORE BREAKFAST.   losartan (COZAAR) 100 MG tablet Take 1 tablet (100 mg total) by mouth daily.   nystatin cream (MYCOSTATIN) Apply 1 application  topically daily as needed for dry skin.   triamcinolone cream (KENALOG) 0.1 % Apply 1 application  topically 2 (two) times daily as  needed (Rash).     Allergies:   Other, Codeine, Shellfish allergy, and Statins   ROS:   Please see the history of present illness.     All other systems reviewed and are negative.   Labs/Other Tests and Data Reviewed:    Recent Labs: 05/08/2021: ALT 18; BUN 13; Creatinine, Ser 0.88; Hemoglobin 14.2; Platelets 243; Potassium 4.6; Sodium 145; TSH 3.780   Recent Lipid Panel Lab Results  Component Value Date/Time   CHOL 130 05/08/2021 08:06 AM   TRIG 105 05/08/2021 08:06 AM   HDL 67 05/08/2021 08:06 AM   CHOLHDL 1.9 05/08/2021 08:06 AM   CHOLHDL 4.4 10/27/2019 09:03 AM   LDLCALC 44 05/08/2021 08:06 AM   LDLCALC 156 (H) 10/27/2019 09:03 AM    Wt Readings from Last 3 Encounters:  05/03/21 158 lb (71.7 kg)  12/26/20 154 lb 12.8 oz (70.2 kg)  10/30/20 152 lb (68.9 kg)     ASSESSMENT & PLAN:    Acute sinusitis Started empiric Augmentin as he has persistent symptoms despite symptomatic treatment Nasal saline spray as needed for nasal congestion Mucinex as needed for cough Tessalon as needed for cough  Time:   Today, I have spent 9 minutes reviewing the chart, including problem list, medications, and with the patient with telehealth technology discussing the above problems.   Medication Adjustments/Labs and Tests Ordered: Current medicines are reviewed at length with the patient today.  Concerns regarding medicines are outlined above.   Tests Ordered: No orders of the defined types were placed in this encounter.   Medication Changes: No orders of the defined types were placed in this encounter.    Note: This dictation was prepared with Dragon dictation along with smaller phrase technology. Similar sounding words can be transcribed inadequately or may not be corrected upon review. Any transcriptional errors that result from this process are unintentional.      Disposition:  Follow up  Signed, Anabel Halon, MD  09/04/2021 12:08 PM     [facility]

## 2021-09-05 ENCOUNTER — Encounter: Payer: Federal, State, Local not specified - PPO | Admitting: Nurse Practitioner

## 2021-09-05 ENCOUNTER — Ambulatory Visit (INDEPENDENT_AMBULATORY_CARE_PROVIDER_SITE_OTHER): Payer: Federal, State, Local not specified - PPO | Admitting: Nurse Practitioner

## 2021-09-05 ENCOUNTER — Encounter: Payer: Self-pay | Admitting: Nurse Practitioner

## 2021-09-05 VITALS — BP 158/70 | HR 62 | Ht 63.0 in | Wt 169.0 lb

## 2021-09-05 DIAGNOSIS — Z0001 Encounter for general adult medical examination with abnormal findings: Secondary | ICD-10-CM

## 2021-09-05 DIAGNOSIS — E118 Type 2 diabetes mellitus with unspecified complications: Secondary | ICD-10-CM

## 2021-09-05 DIAGNOSIS — Z0000 Encounter for general adult medical examination without abnormal findings: Secondary | ICD-10-CM | POA: Insufficient documentation

## 2021-09-05 DIAGNOSIS — E039 Hypothyroidism, unspecified: Secondary | ICD-10-CM | POA: Diagnosis not present

## 2021-09-05 DIAGNOSIS — E785 Hyperlipidemia, unspecified: Secondary | ICD-10-CM

## 2021-09-05 DIAGNOSIS — I10 Essential (primary) hypertension: Secondary | ICD-10-CM

## 2021-09-05 MED ORDER — REPATHA SURECLICK 140 MG/ML ~~LOC~~ SOAJ: 140.0000 mg | SUBCUTANEOUS | 11 refills | Status: DC

## 2021-09-05 MED ORDER — AMLODIPINE BESYLATE 2.5 MG PO TABS: 2.5000 mg | ORAL_TABLET | Freq: Every day | ORAL | 0 refills | Status: DC

## 2021-09-05 NOTE — Assessment & Plan Note (Signed)
Lab Results  Component Value Date   TSH 3.780 05/08/2021  On levothyroxine 50 mcg tablet daily Check TSH T4

## 2021-09-05 NOTE — Progress Notes (Signed)
Complete physical exam  Patient: Sophia Robbins   DOB: 07-28-1960   61 y.o. Female  MRN: [id number]  Subjective:    Chief Complaint  Patient presents with   Annual Exam    cpe    Sophia Robbins is a 61 y.o. female with past medical history of diabetes, hypertension, hyperlipidemia, vitamin D deficiency, who presents today for a complete physical exam. She reports consuming a low fat and low sodium diet. She generally feels well. Does some walking during her lunch  at work.  She has been taking Augmentin that was prescribed for acute sinusitis.  Hypertension.  Currently on amlodipine 5 mg daily, losartan 100 mg daily.  Patient denies chest pain, dizziness, edema.  Stated that she has been taking both medications as ordered  Has upcoming diagnostic mammography with possible ultrasound of right axilla in October.      Most recent fall risk assessment:    09/05/2021    3:33 PM  [place] in the past year? 0  Number falls in past yr: 0  Injury with Fall? 0  Risk for fall due to : No Fall Risks  Follow up Falls evaluation completed     Most recent depression screenings:    09/05/2021    3:33 PM 09/04/2021   11:43 AM  PHQ 2/9 Scores  PHQ - 2 Score 0 0        Patient Care Team: Renee Rival, FNP as PCP - General (Nurse Practitioner) Beryle Lathe, [place] (Inactive) (Pharmacist)   Outpatient Medications Prior to Visit  Medication Sig   amLODipine (NORVASC) 5 MG tablet Take 1 tablet (5 mg total) by mouth daily.   amoxicillin-clavulanate (AUGMENTIN) 875-125 MG tablet Take 1 tablet by mouth 2 (two) times daily.   aspirin EC 81 MG tablet Take 81 mg by mouth daily.   benzonatate (TESSALON) 100 MG capsule Take 1 capsule (100 mg total) by mouth 2 (two) times daily as needed for cough.   cetirizine (ZYRTEC) 10 MG tablet Take 10 mg by mouth daily.   clobetasol ointment (TEMOVATE) 0.05 % Apply topically 2 times daily as needed (Skin Rash).   ezetimibe (ZETIA)  10 MG tablet Take 1 tablet (10 mg total) by mouth daily.   gabapentin (NEURONTIN) 300 MG capsule Take 300 mg by mouth 2 (two) times daily.   levothyroxine (SYNTHROID) 25 MCG tablet TAKE 2 TABLETS (50 MCG TOTAL) BY MOUTH DAILY BEFORE BREAKFAST.   losartan (COZAAR) 100 MG tablet Take 1 tablet (100 mg total) by mouth daily.   nystatin cream (MYCOSTATIN) Apply 1 application  topically daily as needed for dry skin.   triamcinolone cream (KENALOG) 0.1 % Apply 1 application  topically 2 (two) times daily as needed (Rash).   [DISCONTINUED] Evolocumab (REPATHA SURECLICK) [id number] MG/ML SOAJ Inject 140 mg into the skin every 14 (fourteen) days.   fluticasone (FLOVENT HFA) 110 MCG/ACT inhaler Inhale 2 puffs into the lungs daily as needed (Shortness of breath). (Patient not taking: Reported on 09/05/2021)   glucose blood test strip Use as instructed (Patient not taking: Reported on 09/05/2021)   No facility-administered medications prior to visit.    Review of Systems  Constitutional: Negative.   HENT: Negative.    Eyes: Negative.   Respiratory: Negative.    Cardiovascular: Negative.   Gastrointestinal: Negative.   Genitourinary: Negative.   Musculoskeletal: Negative.   Skin: Negative.   Neurological: Negative.   Endo/Heme/Allergies: Negative.   Psychiatric/Behavioral: Negative.  Objective:     BP (!) 158/70   Pulse 62   Ht 5\' 3"  (1.6 m)   Wt 169 lb (76.7 kg)   SpO2 94%   BMI 29.94 kg/m    Physical Exam Constitutional:      General: She is not in acute distress.    Appearance: She is obese. She is not ill-appearing, toxic-appearing or diaphoretic.  HENT:     Head: Normocephalic and atraumatic.     Right Ear: Tympanic membrane, ear canal and external ear normal. There is no impacted cerumen.     Left Ear: Tympanic membrane, ear canal and external ear normal. There is no impacted cerumen.     Nose: No congestion or rhinorrhea.     Mouth/Throat:     Mouth: Mucous membranes  are moist.     Pharynx: Oropharynx is clear. No oropharyngeal exudate or posterior oropharyngeal erythema.  Eyes:     General: No scleral icterus.       Right eye: No discharge.        Left eye: No discharge.     Extraocular Movements: Extraocular movements intact.     Conjunctiva/sclera: Conjunctivae normal.     Pupils: Pupils are equal, round, and reactive to light.     Comments: Puffy eyes  Neck:     Vascular: No carotid bruit.  Cardiovascular:     Rate and Rhythm: Normal rate and regular rhythm.     Pulses: Normal pulses.     Heart sounds: Normal heart sounds. No murmur heard.    No friction rub. No gallop.  Pulmonary:     Effort: Pulmonary effort is normal. No respiratory distress.     Breath sounds: Normal breath sounds. No stridor. No wheezing, rhonchi or rales.  Chest:     Chest wall: No mass, lacerations, deformity, swelling, tenderness, crepitus or edema. There is no dullness to percussion.  Breasts:    Tanner Score is 5.     Right: No swelling, bleeding, inverted nipple, mass, nipple discharge or tenderness.     Left: Normal. No swelling, bleeding, inverted nipple, mass, nipple discharge, skin change or tenderness.     Comments: Brown colored Mole noted on right breast, mole unchanged per patient . No drainage or redness noted Abdominal:     General: There is no distension.     Palpations: Abdomen is soft. There is no mass.     Tenderness: There is no abdominal tenderness. There is no right CVA tenderness, left CVA tenderness, guarding or rebound.     Hernia: No hernia is present.  Musculoskeletal:        General: No swelling, tenderness, deformity or signs of injury.     Cervical back: Normal range of motion and neck supple. No rigidity or tenderness.     Right lower leg: No edema.     Left lower leg: No edema.  Lymphadenopathy:     Cervical: No cervical adenopathy.     Upper Body:     Right upper body: No supraclavicular, axillary or pectoral adenopathy.     Left  upper body: No supraclavicular, axillary or pectoral adenopathy.  Skin:    General: Skin is warm and dry.     Capillary Refill: Capillary refill takes less than 2 seconds.     Coloration: Skin is not jaundiced or pale.     Findings: No bruising, erythema, lesion or rash.  Neurological:     Mental Status: She is alert and oriented to person, place,  and time.     Cranial Nerves: No cranial nerve deficit.     Sensory: No sensory deficit.     Motor: No weakness.     Coordination: Coordination normal.     Gait: Gait normal.     Deep Tendon Reflexes: Reflexes normal.  Psychiatric:        Mood and Affect: Mood normal.        Behavior: Behavior normal.        Thought Content: Thought content normal.        Judgment: Judgment normal.      No results found for any visits on 09/05/21.     Assessment & Plan:    Routine Health Maintenance and Physical Exam  Immunization History  Administered Date(s) Administered   Influenza,inj,Quad PF,6+ Mos 10/11/2019   Influenza-Unspecified 11/09/2018   Moderna Sars-Covid-2 Vaccination 01/18/2019, 02/15/2019, 07/26/2020   PFIZER(Purple Top)SARS-COV-2 Vaccination 11/25/2019   Tdap 05/03/2021   Zoster Recombinat (Shingrix) 05/03/2021    Health Maintenance  Topic Date Due   HIV Screening  Never done   Zoster Vaccines- Shingrix (2 of 2) 06/28/2021   INFLUENZA VACCINE  08/20/2021   COVID-19 Vaccine (5 - Moderna series) 09/19/2021 (Originally 09/20/2020)   HEMOGLOBIN A1C  11/07/2021   PAP SMEAR-Modifier  01/18/2022   OPHTHALMOLOGY EXAM  03/04/2022   FOOT EXAM  09/06/2022   MAMMOGRAM  05/07/2023   COLONOSCOPY (Pts 45-64yrs Insurance coverage will need to be confirmed)  06/27/2030   TETANUS/TDAP  05/04/2031   Hepatitis C Screening  Completed   HPV VACCINES  Aged Out    Discussed health benefits of physical activity, and encouraged her to engage in regular exercise appropriate for her age and condition.  Problem List Items Addressed This Visit        Cardiovascular and Mediastinum   Essential hypertension    BP Readings from Last 3 Encounters:  09/05/21 (!) 158/70  05/03/21 124/62  12/26/20 131/74  Condition uncontrolled Current on amlodipine 5 mg daily, losartan 100 mg daily Start amlodipine 7.5 mg daily DASH diet advised engage in regular moderate exercises at least 150 minutes weekly CMP ordered For follow-up in 4 weeks BP goal is less than 130/80      Relevant Medications   Evolocumab (REPATHA SURECLICK) 140 MG/ML SOAJ   amLODipine (NORVASC) 2.5 MG tablet     Endocrine   Controlled diabetes mellitus type 2 with complications (HCC)    Diet controlled Check A1c Diet exam today Foot exam completed today Avoid sugar , sweets , soda      Hypothyroidism    Lab Results  Component Value Date   TSH 3.780 05/08/2021  On levothyroxine 50 mcg tablet daily Check TSH T4        Other   Hyperlipidemia LDL goal <70    On Repatha 140 mg every 2 weeks, Zetia 10 mg daily Check fasting lipid panel      Relevant Medications   Evolocumab (REPATHA SURECLICK) 140 MG/ML SOAJ   amLODipine (NORVASC) 2.5 MG tablet   Need for varicella vaccine    Patient educated on CDC recommendation for the vaccine. Verbal consent was obtained from the patient, vaccine administered by nurse, no sign of adverse reactions noted at this time. Patient education on arm soreness and use of tylenol or  for this patient  was discussed. Patient educated on the signs and symptoms of adverse effect and advise to contact the office if they occur.        Annual physical  exam - Primary    Annual exam as documented.  Counseling done include healthy lifestyle involving committing to 150 minutes of exercise per week, heart healthy diet, and attaining healthy weight. The importance of adequate sleep also discussed.  Regular use of seat belt and home safety were also discussed . Changes in health habits are decided on by patient with goals and time frames set  for achieving them. Immunization and cancer screening  needs are specifically addressed at this visit.  Second dose of shingles vaccine given today      Relevant Orders   Basic Metabolic Panel (BMET)   HgB A1c   Lipid Profile   TSH + free T4   Vitamin D (25 hydroxy)   HIV antibody (with reflex)   Return in about 4 weeks (around 10/03/2021) for HTN.     Renee Rival, FNP

## 2021-09-05 NOTE — Assessment & Plan Note (Deleted)
Patient educated on CDC recommendation for the vaccine. Verbal consent was obtained from the patient, vaccine administered by nurse, no sign of adverse reactions noted at this time. Patient education on arm soreness and use of tylenol orfor this patient  was discussed. Patient educated on the signs and symptoms of adverse effect and advise to contact the office if they occur 

## 2021-09-05 NOTE — Patient Instructions (Addendum)
Please start taking amlodipine 7.5mg  daily , the goal for your blood pressure is less than 130/80.     It is important that you exercise regularly at least 30 minutes 5 times a week.  Think about what you will eat, plan ahead. Choose " clean, green, fresh or frozen" over canned, processed or packaged foods which are more sugary, salty and fatty. 70 to 75% of food eaten should be vegetables and fruit. Three meals at set times with snacks allowed between meals, but they must be fruit or vegetables. Aim to eat over a 12 hour period , example 7 am to 7 pm, and STOP after  your last meal of the day. Drink water,generally about 64 ounces per day, no other drink is as healthy. Fruit juice is best enjoyed in a healthy way, by EATING the fruit.  Thanks for choosing [facility], we consider it a privelige to serve you.

## 2021-09-05 NOTE — Assessment & Plan Note (Signed)
On Repatha 140 mg every 2 weeks, Zetia 10 mg daily Check fasting lipid panel

## 2021-09-05 NOTE — Assessment & Plan Note (Signed)
Annual exam as documented.  Counseling done include healthy lifestyle involving committing to 150 minutes of exercise per week, heart healthy diet, and attaining healthy weight. The importance of adequate sleep also discussed.  Regular use of seat belt and home safety were also discussed . Changes in health habits are decided on by patient with goals and time frames set for achieving them. Immunization and cancer screening  needs are specifically addressed at this visit.  Second dose of shingles vaccine given today

## 2021-09-05 NOTE — Assessment & Plan Note (Signed)
BP Readings from Last 3 Encounters:  09/05/21 (!) 158/70  05/03/21 124/62  12/26/20 131/74   Condition uncontrolled Current on amlodipine 5 mg daily, losartan 100 mg daily Start amlodipine 7.5 mg daily DASH diet advised engage in regular moderate exercises at least 150 minutes weekly CMP ordered For follow-up in 4 weeks BP goal is less than 130/80

## 2021-09-05 NOTE — Assessment & Plan Note (Signed)
Diet controlled Check A1c Diet exam today Foot exam completed today Avoid sugar , sweets , soda

## 2021-09-06 ENCOUNTER — Other Ambulatory Visit: Payer: Self-pay | Admitting: Nurse Practitioner

## 2021-09-06 NOTE — Progress Notes (Unsigned)
The 10-year ASCVD risk score (Arnett DK, et al., 2019) is: 10.4%   Values used to calculate the score:     Age: 61 years     Sex: Female     Is Non-Hispanic African American: No     Diabetic: Yes     Tobacco smoker: No     Systolic Blood Pressure: 158 mmHg     Is BP treated: Yes     HDL Cholesterol: 67 mg/dL     Total Cholesterol: 130 mg/dL

## 2021-09-11 DIAGNOSIS — Z0000 Encounter for general adult medical examination without abnormal findings: Secondary | ICD-10-CM | POA: Diagnosis not present

## 2021-09-12 LAB — HEMOGLOBIN A1C
Est. average glucose Bld gHb Est-mCnc: 123 mg/dL
Hgb A1c MFr Bld: 5.9 % — ABNORMAL HIGH (ref 4.8–5.6)

## 2021-09-12 LAB — LIPID PANEL
Chol/HDL Ratio: 3.4 ratio (ref 0.0–4.4)
Cholesterol, Total: 192 mg/dL (ref 100–199)
HDL: 57 mg/dL (ref >39)
LDL Chol Calc (NIH): 107 mg/dL — ABNORMAL HIGH (ref 0–99)
Triglycerides: 160 mg/dL — ABNORMAL HIGH (ref 0–149)
VLDL Cholesterol Cal: 28 mg/dL (ref 5–40)

## 2021-09-12 LAB — VITAMIN D 25 HYDROXY (VIT D DEFICIENCY, FRACTURES): Vit D, 25-Hydroxy: 24.5 ng/mL — ABNORMAL LOW (ref 30.0–100.0)

## 2021-09-12 LAB — BASIC METABOLIC PANEL
BUN/Creatinine Ratio: 18 (ref 12–28)
BUN: 14 mg/dL (ref 8–27)
CO2: 24 mmol/L (ref 20–29)
Calcium: 9.5 mg/dL (ref 8.7–10.3)
Chloride: 103 mmol/L (ref 96–106)
Creatinine, Ser: 0.8 mg/dL (ref 0.57–1.00)
Glucose: 130 mg/dL — ABNORMAL HIGH (ref 70–99)
Potassium: 5.2 mmol/L (ref 3.5–5.2)
Sodium: 139 mmol/L (ref 134–144)
eGFR: 83 mL/min/{1.73_m2} (ref >59)

## 2021-09-12 LAB — HIV ANTIBODY (ROUTINE TESTING W REFLEX): HIV Screen 4th Generation wRfx: NONREACTIVE

## 2021-09-12 LAB — TSH+FREE T4
Free T4: 1.25 ng/dL (ref 0.82–1.77)
TSH: 0.886 u[IU]/mL (ref 0.450–4.500)

## 2021-09-13 NOTE — Progress Notes (Signed)
Diabetes remains well controlled but her A1C hs increased, avoid sugar sweets, soda, juice  LDL not at goal of less than 70, please find out if she has been taking zetia 10mg  daily and repatha injection. If yes pt should avoid fatty fried foods and kindly refer the  patient to cardiology(Dr. ) for further management of her hyperlipidemia.  Vitamin D deficiency. Start vitamin D1000 units daily .   Other labs are normal

## 2021-09-19 ENCOUNTER — Other Ambulatory Visit: Payer: Self-pay | Admitting: Nurse Practitioner

## 2021-09-19 ENCOUNTER — Other Ambulatory Visit: Payer: Self-pay

## 2021-09-19 DIAGNOSIS — E785 Hyperlipidemia, unspecified: Secondary | ICD-10-CM

## 2021-09-20 ENCOUNTER — Other Ambulatory Visit: Payer: Self-pay | Admitting: Nurse Practitioner

## 2021-09-20 ENCOUNTER — Other Ambulatory Visit: Payer: Self-pay

## 2021-09-20 DIAGNOSIS — I10 Essential (primary) hypertension: Secondary | ICD-10-CM

## 2021-09-20 MED ORDER — GABAPENTIN 300 MG PO CAPS
300.0000 mg | ORAL_CAPSULE | Freq: Two times a day (BID) | ORAL | 0 refills | Status: DC
  Filled 2021-09-20: qty 180, 90d supply, fill #0

## 2021-09-20 MED ORDER — GABAPENTIN 300 MG PO CAPS: 300.0000 mg | ORAL_CAPSULE | Freq: Two times a day (BID) | ORAL | 0 refills | Status: DC

## 2021-10-16 ENCOUNTER — Ambulatory Visit: Payer: Federal, State, Local not specified - PPO | Admitting: Nurse Practitioner

## 2021-10-17 ENCOUNTER — Ambulatory Visit: Payer: Federal, State, Local not specified - PPO | Admitting: Internal Medicine

## 2021-10-17 ENCOUNTER — Encounter: Payer: Self-pay | Admitting: Internal Medicine

## 2021-10-17 VITALS — BP 138/68 | HR 58 | Ht 63.0 in | Wt 174.0 lb

## 2021-10-17 DIAGNOSIS — Z23 Encounter for immunization: Secondary | ICD-10-CM

## 2021-10-17 DIAGNOSIS — I10 Essential (primary) hypertension: Secondary | ICD-10-CM | POA: Diagnosis not present

## 2021-10-17 MED ORDER — AMLODIPINE BESYLATE 10 MG PO TABS: 10.0000 mg | ORAL_TABLET | Freq: Every day | ORAL | 2 refills | Status: DC

## 2021-10-17 NOTE — Assessment & Plan Note (Signed)
Second Shingrix vaccine administered today 

## 2021-10-17 NOTE — Assessment & Plan Note (Signed)
BP 138/68 today.  She has been checking her blood pressure at home over the last month and reports systolic readings consistently 130-140s.  Her current antihypertensive medication regimen consist of losartan 100 mg daily and amlodipine 7.5 mg daily. -We discussed that her BP goal is less than 130/80.  Given that most of her home BP readings have systolic readings 169-450T, she is in agreement with further increasing amlodipine to 10 mg daily. -We will plan for follow-up in 4 weeks for BP check.  In the interim, I have encouraged her to continue checking her blood pressure routinely and to keep a log to bring to her next appointment for review.

## 2021-10-17 NOTE — Progress Notes (Signed)
Established Patient Office Visit  Subjective   Patient ID: Sophia Robbins, female    DOB: 01/13/1961  Age: 61 y.o. MRN: [id number]  Chief Complaint  Patient presents with   Follow-up   [name] is a 61 year old woman presenting for follow-up today.  She has a past medical history significant for HTN, OSA, T2DM, hypothyroidism, and HLD.  Last seen in route seen follow-up by [name], NP on 8/17.  Her blood pressure was elevated at that time and amlodipine was increased to 7.5 mg daily.  She returns today for BP check. [name] states that she feels well.  She has no acute concerns today and denies all symptoms.  Past Medical History:  Diagnosis Date   Allergy    Asthma    Diabetes mellitus without complication ([place])    Hyperlipidemia    OSA on CPAP    Past Surgical History:  Procedure Laterality Date   APPENDECTOMY     BIOPSY  06/26/2020   Procedure: BIOPSY;  Surgeon: Harvel Quale, MD;  [facility];  Service: Gastroenterology;;   CESAREAN SECTION     COLONOSCOPY WITH PROPOFOL N/A 06/26/2020   Procedure: COLONOSCOPY WITH PROPOFOL;  Surgeon: Harvel Quale, MD;  [facility];  Service: Gastroenterology;  Laterality: N/A;  9:00   ESOPHAGOGASTRODUODENOSCOPY (EGD) WITH PROPOFOL N/A 06/26/2020   Procedure: ESOPHAGOGASTRODUODENOSCOPY (EGD) WITH PROPOFOL;  Surgeon: Harvel Quale, MD;  [facility];  Service: Gastroenterology;  Laterality: N/A;   EXPLORATION POST OPERATIVE OPEN HEART     KNEE ARTHROSCOPY AND ARTHROTOMY Left    TUBAL LIGATION     Social History   Tobacco Use   Smoking status: Former    Types: Cigarettes    Quit date: 06/20/2012    Years since quitting: 9.3   Smokeless tobacco: Never  Vaping Use   Vaping Use: Never used  Substance Use Topics   Alcohol use: Yes    Alcohol/week: 3.0 standard drinks of alcohol    Types: 3 Glasses of wine per week    Comment: occasional   Drug use: Not  Currently   Family History  Problem Relation Age of Onset   Cancer Mother    Lung cancer Mother    Cancer Father    Esophageal cancer Father    Diabetes Sister    Sleep apnea Sister    COPD Brother    Sleep apnea Brother    Stomach cancer Brother    Cancer - Ovarian Paternal Grandmother    Allergies  Allergen Reactions   Other Itching, Rash, Shortness Of Breath and Swelling   Codeine Hives   Shellfish Allergy Hives   Statins Hives    Blisters and peeling of skin   Review of Systems  Constitutional:  Negative for chills and fever.  HENT:  Negative for sore throat.   Respiratory:  Negative for cough and shortness of breath.   Cardiovascular:  Negative for chest pain, palpitations and leg swelling.  Gastrointestinal:  Negative for abdominal pain, blood in stool, constipation, diarrhea, nausea and vomiting.  Genitourinary:  Negative for dysuria and hematuria.  Musculoskeletal:  Negative for myalgias.  Skin:  Negative for itching and rash.  Neurological:  Negative for dizziness and headaches.  Psychiatric/Behavioral:  Negative for depression and suicidal ideas.      Objective:     BP 138/68   Pulse (!) 58   Ht '5\' 3"'  (1.6 m)   Wt 174 lb (78.9 kg)  SpO2 96%   BMI 30.82 kg/m  BP Readings from Last 3 Encounters:  10/17/21 138/68  09/05/21 (!) 158/70  05/03/21 124/62   Physical Exam Constitutional:      General: She is not in acute distress.    Appearance: Normal appearance. She is normal weight. She is not toxic-appearing.  HENT:     Head: Normocephalic and atraumatic.  Cardiovascular:     Rate and Rhythm: Normal rate and regular rhythm.     Pulses: Normal pulses.     Heart sounds: Murmur heard.  Pulmonary:     Effort: Pulmonary effort is normal.     Breath sounds: Normal breath sounds. No wheezing, rhonchi or rales.  Abdominal:     General: Abdomen is flat. Bowel sounds are normal. There is no distension.     Palpations: Abdomen is soft.     Tenderness:  There is no abdominal tenderness.  Musculoskeletal:        General: No swelling.     Right lower leg: No edema.     Left lower leg: No edema.  Skin:    General: Skin is warm and dry.     Coloration: Skin is not jaundiced.  Neurological:     Mental Status: She is alert.    Last CBC Lab Results  Component Value Date   WBC 3.7 05/08/2021   HGB 14.2 05/08/2021   HCT 40.3 05/08/2021   MCV 89 05/08/2021   MCH 31.4 05/08/2021   RDW 12.3 05/08/2021   PLT 243 56/38/7564   Last metabolic panel Lab Results  Component Value Date   GLUCOSE 130 (H) 09/11/2021   NA 139 09/11/2021   K 5.2 09/11/2021   CL 103 09/11/2021   CO2 24 09/11/2021   BUN 14 09/11/2021   CREATININE 0.80 09/11/2021   EGFR 83 09/11/2021   CALCIUM 9.5 09/11/2021   PROT 7.0 05/08/2021   ALBUMIN 4.7 05/08/2021   LABGLOB 2.3 05/08/2021   AGRATIO 2.0 05/08/2021   BILITOT 0.4 05/08/2021   ALKPHOS 93 05/08/2021   AST 21 05/08/2021   ALT 18 05/08/2021   Last lipids Lab Results  Component Value Date   CHOL 192 09/11/2021   HDL 57 09/11/2021   LDLCALC 107 (H) 09/11/2021   TRIG 160 (H) 09/11/2021   CHOLHDL 3.4 09/11/2021   Last hemoglobin A1c Lab Results  Component Value Date   HGBA1C 5.9 (H) 09/11/2021   Last thyroid functions Lab Results  Component Value Date   TSH 0.886 09/11/2021   Last vitamin D Lab Results  Component Value Date   VD25OH 24.5 (L) 09/11/2021   The 10-year ASCVD risk score (Arnett DK, et al., 2019) is: 11.3%    Assessment & Plan:   Problem List Items Addressed This Visit     Essential hypertension - Primary    BP 138/68 today.  She has been checking her blood pressure at home over the last month and reports systolic readings consistently 130-140s.  Her current antihypertensive medication regimen consist of losartan 100 mg daily and amlodipine 7.5 mg daily. -We discussed that her BP goal is less than 130/80.  Given that most of her home BP readings have systolic readings  [id number], she is in agreement with further increasing amlodipine to 10 mg daily. -We will plan for follow-up in 4 weeks for BP check.  In the interim, I have encouraged her to continue checking her blood pressure routinely and to keep a log to bring to her next appointment for  review.      Relevant Medications   amLODipine (NORVASC) 10 MG tablet   Need for varicella vaccine    Second Shingrix vaccine administered today      Return in about 4 weeks (around 11/14/2021).    Johnette Abraham, MD

## 2021-10-17 NOTE — Patient Instructions (Signed)
It was a pleasure to see you today.  Thank you for giving [place] the opportunity to be involved in your care.  Below is a brief recap of your visit and next steps.  We will plan to see you again in 4 weeks.  Summary We have increased amlodipine to 10 mg daily.  Next steps Follow up in 4 weeks. Please keep a blood pressure log to bring with you to your next appointment.

## 2021-10-28 ENCOUNTER — Encounter: Payer: Self-pay | Admitting: *Deleted

## 2021-10-28 NOTE — Progress Notes (Unsigned)
PATIENT: Sophia Robbins DOB: 1960/09/26  REASON FOR VISIT: follow up HISTORY FROM: patient PRIMARY NEUROLOGIST:   Virtual Visit via Video Note  I connected with Sophia Robbins on 10/29/21 at  3:15 PM EDT by a video enabled telemedicine application located remotely at [facility] and verified that I am speaking with the correct person using two identifiers who was located at their own home.   I discussed the limitations of evaluation and management by telemedicine and the availability of in person appointments. The patient expressed understanding and agreed to proceed.   PATIENT: Sophia Robbins DOB: March 01, 1960  REASON FOR VISIT: follow up HISTORY FROM: patient  HISTORY OF PRESENT ILLNESS: Today 10/29/21:  Sophia Robbins is a 61 year old female with a history of obstructive sleep apnea on CPAP.  She returns today for follow-up.  Her download is below.  She states the CPAP is working well.  She has noticed more sleepiness but states that she started a new medication and she feels that this is the cause.  She returns today for an evaluation.   REVIEW OF SYSTEMS: Out of a complete 14 system review of symptoms, the patient complains only of the following symptoms, and all other reviewed systems are negative.  ALLERGIES: Allergies  Allergen Reactions   Other Itching, Rash, Shortness Of Breath and Swelling   Codeine Hives   Shellfish Allergy Hives   Statins Hives    Blisters and peeling of skin    HOME MEDICATIONS: Outpatient Medications Prior to Visit  Medication Sig Dispense Refill   amLODipine (NORVASC) 10 MG tablet Take 1 tablet (10 mg total) by mouth daily. 30 tablet 2   aspirin EC 81 MG tablet Take 81 mg by mouth daily.     benzonatate (TESSALON) 100 MG capsule Take 1 capsule (100 mg total) by mouth 2 (two) times daily as needed for cough. 20 capsule 0   cetirizine (ZYRTEC) 10 MG tablet Take 10 mg by mouth daily.     clobetasol ointment (TEMOVATE) 0.05 %  Apply topically 2 times daily as needed (Skin Rash). 30 g 1   Evolocumab (REPATHA SURECLICK) 140 MG/ML SOAJ Inject 140 mg into the skin every 14 (fourteen) days. 2 mL 11   ezetimibe (ZETIA) 10 MG tablet Take 1 tablet (10 mg total) by mouth daily. 90 tablet 2   fluticasone (FLOVENT HFA) 110 MCG/ACT inhaler Inhale 2 puffs into the lungs daily as needed (Shortness of breath). (Patient not taking: Reported on 09/05/2021)     gabapentin (NEURONTIN) 300 MG capsule Take 1 capsule (300 mg total) by mouth 2 (two) times daily. 180 capsule 0   glucose blood test strip Use as instructed (Patient not taking: Reported on 09/05/2021) 100 each 12   levothyroxine (SYNTHROID) 25 MCG tablet TAKE 2 TABLETS (50 MCG TOTAL) BY MOUTH DAILY BEFORE BREAKFAST. 180 tablet 0   losartan (COZAAR) 100 MG tablet Take 1 tablet (100 mg total) by mouth daily. 90 tablet 2   nystatin cream (MYCOSTATIN) Apply 1 application  topically daily as needed for dry skin.     triamcinolone cream (KENALOG) 0.1 % Apply 1 application  topically 2 (two) times daily as needed (Rash).     No facility-administered medications prior to visit.    PAST MEDICAL HISTORY: Past Medical History:  Diagnosis Date   Allergy    Asthma    Diabetes mellitus without complication (HCC)    Hyperlipidemia    OSA on CPAP     PAST SURGICAL HISTORY:  Past Surgical History:  Procedure Laterality Date   APPENDECTOMY     BIOPSY  06/26/2020   Procedure: BIOPSY;  Surgeon: Dolores Frame, MD;  [facility];  Service: Gastroenterology;;   CESAREAN SECTION     COLONOSCOPY WITH PROPOFOL N/A 06/26/2020   Procedure: COLONOSCOPY WITH PROPOFOL;  Surgeon: Dolores Frame, MD;  [facility];  Service: Gastroenterology;  Laterality: N/A;  9:00   ESOPHAGOGASTRODUODENOSCOPY (EGD) WITH PROPOFOL N/A 06/26/2020   Procedure: ESOPHAGOGASTRODUODENOSCOPY (EGD) WITH PROPOFOL;  Surgeon: Dolores Frame, MD;  [facility];   Service: Gastroenterology;  Laterality: N/A;   EXPLORATION POST OPERATIVE OPEN HEART     KNEE ARTHROSCOPY AND ARTHROTOMY Left    TUBAL LIGATION      FAMILY HISTORY: Family History  Problem Relation Age of Onset   Cancer Mother    Lung cancer Mother    Cancer Father    Esophageal cancer Father    Diabetes Sister    Sleep apnea Sister    COPD Brother    Sleep apnea Brother    Stomach cancer Brother    Cancer - Ovarian Paternal Grandmother     SOCIAL HISTORY: Social History   Socioeconomic History   Marital status: Married    Spouse name: Not on file   Number of children: 1   Years of education: Not on file   Highest education level: Some college, no degree  Occupational History   Occupation: Public relations account executive    Comment: [employer]  Tobacco Use   Smoking status: Former    Types: Cigarettes    Quit date: 06/20/2012    Years since quitting: 9.3   Smokeless tobacco: Never  Vaping Use   Vaping Use: Never used  Substance and Sexual Activity   Alcohol use: Yes    Alcohol/week: 3.0 standard drinks of alcohol    Types: 3 Glasses of wine per week    Comment: occasional   Drug use: Not Currently   Sexual activity: Yes    Birth control/protection: Surgical    Comment: tubal  Other Topics Concern   Not on file  Social History Narrative   Lives with husband of 39 years    1 daughter -granddaughter and grandson      Two cats-outside      Enjoys: crafts, Web designer, redoing home       Diet: eat all food groups, avoids milk -little intolerance   Caffeine: 1 cup coffee, unsweet iced tea   Water: 6 cups daily       Wears seat belt   Smoke detectors at home    Does not use phone while driving    Social Determinants of Corporate investment banker Strain: Not on file  Food Insecurity: Not on file  Transportation Needs: Not on file  Physical Activity: Not on file  Stress: Not on file  Social Connections: Not on file  Intimate Partner Violence: Not on file      PHYSICAL  EXAM Generalized: Well developed, in no acute distress   Neurological examination  Mentation: Alert oriented to time, place, history taking. Follows all commands speech and language fluent Cranial nerve II-XII:Extraocular movements were full. Facial symmetry noted. Head turning and shoulder shrug  were normal and symmetric.Marland Kitchen  Reflexes: UTA  DIAGNOSTIC DATA (LABS, IMAGING, TESTING) - I reviewed patient records, labs, notes, testing and imaging myself where available.  Lab Results  Component Value Date   WBC 3.7 05/08/2021   HGB 14.2  05/08/2021   HCT 40.3 05/08/2021   MCV 89 05/08/2021   PLT 243 05/08/2021      Component Value Date/Time   NA 139 09/11/2021 0806   K 5.2 09/11/2021 0806   CL 103 09/11/2021 0806   CO2 24 09/11/2021 0806   GLUCOSE 130 (H) 09/11/2021 0806   GLUCOSE 104 (H) 11/10/2019 0818   BUN 14 09/11/2021 0806   CREATININE 0.80 09/11/2021 0806   CREATININE 0.91 11/10/2019 0818   CALCIUM 9.5 09/11/2021 0806   PROT 7.0 05/08/2021 0806   ALBUMIN 4.7 05/08/2021 0806   AST 21 05/08/2021 0806   ALT 18 05/08/2021 0806   ALKPHOS 93 05/08/2021 0806   BILITOT 0.4 05/08/2021 0806   GFRNONAA 79 01/30/2020 1622   GFRNONAA 69 11/10/2019 0818   GFRAA 91 01/30/2020 1622   GFRAA 79 11/10/2019 0818   Lab Results  Component Value Date   CHOL 192 09/11/2021   HDL 57 09/11/2021   LDLCALC 107 (H) 09/11/2021   TRIG 160 (H) 09/11/2021   CHOLHDL 3.4 09/11/2021   Lab Results  Component Value Date   HGBA1C 5.9 (H) 09/11/2021   No results found for: "VITAMINB12" Lab Results  Component Value Date   TSH 0.886 09/11/2021      ASSESSMENT AND PLAN 61 y.o. year old female  has a past medical history of Allergy, Asthma, Diabetes mellitus without complication (HCC), Hyperlipidemia, and OSA on CPAP. here with:  OSA on CPAP  CPAP compliance excellent Residual AHI is good Encouraged patient to continue using CPAP nightly and > 4 hours each night F/U in 1 year or sooner if  needed  Butch Penny, MSN, NP-C 10/29/2021, 2:59 PM [facility] [address], [address] [place], [place] [address] [phone]

## 2021-10-29 ENCOUNTER — Telehealth (INDEPENDENT_AMBULATORY_CARE_PROVIDER_SITE_OTHER): Payer: Federal, State, Local not specified - PPO | Admitting: Adult Health

## 2021-10-29 DIAGNOSIS — G4733 Obstructive sleep apnea (adult) (pediatric): Secondary | ICD-10-CM

## 2021-10-29 DIAGNOSIS — I10 Essential (primary) hypertension: Secondary | ICD-10-CM | POA: Diagnosis not present

## 2021-10-31 ENCOUNTER — Telehealth: Payer: Self-pay | Admitting: Internal Medicine

## 2021-10-31 MED ORDER — HYDROCHLOROTHIAZIDE 25 MG PO TABS: 12.5000 mg | ORAL_TABLET | Freq: Every day | ORAL | 0 refills | Status: DC

## 2021-10-31 MED ORDER — AMLODIPINE BESYLATE 5 MG PO TABS: 5.0000 mg | ORAL_TABLET | Freq: Every day | ORAL | 0 refills | Status: DC

## 2021-10-31 NOTE — Telephone Encounter (Signed)
This may be a side effect of increasing amlodipine to 10 mg daily.  She should reduce amlodipine to 5 mg daily.  I will prescribe HCTZ 12.5 mg daily.  She has follow-up scheduled in 2 weeks.

## 2021-10-31 NOTE — Telephone Encounter (Signed)
Pt called stating she is having feet & ankle swelling that will not go down. States BP was 130/68 this am before her meds. She is wanting to know what to do? States she is at work and it is ok to leave a detailed message.

## 2021-10-31 NOTE — Addendum Note (Signed)
Addended by: Marland Kitchen E on: 10/31/2021 01:28 PM   Modules accepted: Orders

## 2021-11-14 ENCOUNTER — Ambulatory Visit: Payer: Federal, State, Local not specified - PPO | Admitting: Internal Medicine

## 2021-11-14 ENCOUNTER — Encounter: Payer: Self-pay | Admitting: Internal Medicine

## 2021-11-14 VITALS — BP 147/69 | HR 58 | Ht 63.0 in | Wt 170.6 lb

## 2021-11-14 DIAGNOSIS — I10 Essential (primary) hypertension: Secondary | ICD-10-CM

## 2021-11-14 MED ORDER — HYDROCHLOROTHIAZIDE 25 MG PO TABS: 25.0000 mg | ORAL_TABLET | Freq: Every day | ORAL | 2 refills | Status: DC

## 2021-11-14 NOTE — Patient Instructions (Signed)
It was a pleasure to see you today.  Thank you for giving [place] the opportunity to be involved in your care.  Below is a brief recap of your visit and next steps.  We will plan to see you again in 2 weeks for telephone visit.  Summary Increase HCTZ to 25 mg daily Continue amlodipine and losartan at current doses.  Next steps Follow up in 2 weeks for a telephone visit to review blood pressure readings after increasing HCTZ

## 2021-11-14 NOTE — Assessment & Plan Note (Signed)
BP 147/69 today.  Her current regimen includes amlodipine 5 mg daily, losartan 100 g daily, and HCTZ 12.5 mg daily.  She has been checking her blood pressure at home and keeping a log.  Systolic readings are consistently 130s. -Increase HCTZ to 25 mg daily. -Continue amlodipine and losartan at current doses -Follow-up in 2 weeks for BP check.  This can be a telephone encounter send she can check and record her blood pressures at home.

## 2021-11-14 NOTE — Progress Notes (Signed)
Established Patient Office Visit  Subjective   Patient ID: Sophia Robbins, female    DOB: 1960/08/20  Age: 61 y.o. MRN: [id number]  Chief Complaint  Patient presents with   Follow-up   Sophia Robbins returns to care today.  Sophia Robbins is a 61 year old woman with a past medical history significant for HTN, OSA, T2DM, hypothyroidism, and HLD.  Sophia Robbins was last seen by me on 9/28 for BP check.  At that time Sophia Robbins reported feeling well and had no additional concerns.  Amlodipine was increased to 10 mg daily.  4-week follow-up arranged for BP check.  In the interim, Sophia Robbins called on 10/12 reporting swelling in her ankles and feet.  Amlodipine was decreased to 5 mg and HCTZ 12.5 mg daily added.  Today Sophia Robbins is doing well.  Sophia Robbins states that her ankle swelling has resolved.  Sophia Robbins has been checking her blood pressure at home each day and keeping a log.  Systolic pressures are consistently 130s.  Sophia Robbins has no additional concerns to discuss today.  Past Medical History:  Diagnosis Date   Allergy    Asthma    Diabetes mellitus without complication ([place])    Hyperlipidemia    OSA on CPAP    Past Surgical History:  Procedure Laterality Date   APPENDECTOMY     BIOPSY  06/26/2020   Procedure: BIOPSY;  Surgeon: Harvel Quale, MD;  [facility];  Service: Gastroenterology;;   CESAREAN SECTION     COLONOSCOPY WITH PROPOFOL N/A 06/26/2020   Procedure: COLONOSCOPY WITH PROPOFOL;  Surgeon: Harvel Quale, MD;  [facility];  Service: Gastroenterology;  Laterality: N/A;  9:00   ESOPHAGOGASTRODUODENOSCOPY (EGD) WITH PROPOFOL N/A 06/26/2020   Procedure: ESOPHAGOGASTRODUODENOSCOPY (EGD) WITH PROPOFOL;  Surgeon: Harvel Quale, MD;  [facility];  Service: Gastroenterology;  Laterality: N/A;   EXPLORATION POST OPERATIVE OPEN HEART     KNEE ARTHROSCOPY AND ARTHROTOMY Left    TUBAL LIGATION     Social History   Tobacco Use   Smoking status: Former    Types:  Cigarettes    Quit date: 06/20/2012    Years since quitting: 9.4   Smokeless tobacco: Never  Vaping Use   Vaping Use: Never used  Substance Use Topics   Alcohol use: Yes    Alcohol/week: 3.0 standard drinks of alcohol    Types: 3 Glasses of wine per week    Comment: occasional   Drug use: Not Currently   Family History  Problem Relation Age of Onset   Cancer Mother    Lung cancer Mother    Cancer Father    Esophageal cancer Father    Diabetes Sister    Sleep apnea Sister    COPD Brother    Sleep apnea Brother    Stomach cancer Brother    Cancer - Ovarian Paternal Grandmother    Allergies  Allergen Reactions   Other Itching, Rash, Shortness Of Breath and Swelling   Codeine Hives   Shellfish Allergy Hives   Statins Hives    Blisters and peeling of skin    Review of Systems  Constitutional:  Negative for chills and fever.  HENT:  Negative for sore throat.   Respiratory:  Negative for cough and shortness of breath.   Cardiovascular:  Negative for chest pain, palpitations and leg swelling.  Gastrointestinal:  Negative for abdominal pain, blood in stool, constipation, diarrhea, nausea and vomiting.  Genitourinary:  Negative for dysuria and hematuria.  Musculoskeletal:  Negative for myalgias.  Skin:  Negative for itching and rash.  Neurological:  Negative for dizziness and headaches.  Psychiatric/Behavioral:  Negative for depression and suicidal ideas.      Objective:     BP (!) 147/69   Pulse (!) 58   Ht '5\' 3"'  (1.6 m)   Wt 170 lb 9.6 oz (77.4 kg)   SpO2 96%   BMI 30.22 kg/m  BP Readings from Last 3 Encounters:  11/14/21 (!) 147/69  10/17/21 138/68  09/05/21 (!) 158/70    Physical Exam Constitutional:      General: Sophia Robbins is not in acute distress.    Appearance: Normal appearance. Sophia Robbins is normal weight. Sophia Robbins is not toxic-appearing.  HENT:     Head: Normocephalic and atraumatic.  Cardiovascular:     Rate and Rhythm: Normal rate and regular rhythm.     Pulses:  Normal pulses.     Heart sounds: Murmur heard.  Pulmonary:     Effort: Pulmonary effort is normal.     Breath sounds: Normal breath sounds. No wheezing, rhonchi or rales.  Abdominal:     General: Abdomen is flat. Bowel sounds are normal. There is no distension.     Palpations: Abdomen is soft.     Tenderness: There is no abdominal tenderness.  Musculoskeletal:        General: No swelling.     Right lower leg: No edema.     Left lower leg: No edema.  Skin:    General: Skin is warm and dry.     Coloration: Skin is not jaundiced.  Neurological:     Mental Status: Sophia Robbins is alert.    Last CBC Lab Results  Component Value Date   WBC 3.7 05/08/2021   HGB 14.2 05/08/2021   HCT 40.3 05/08/2021   MCV 89 05/08/2021   MCH 31.4 05/08/2021   RDW 12.3 05/08/2021   PLT 243 80/32/1224   Last metabolic panel Lab Results  Component Value Date   GLUCOSE 130 (H) 09/11/2021   NA 139 09/11/2021   K 5.2 09/11/2021   CL 103 09/11/2021   CO2 24 09/11/2021   BUN 14 09/11/2021   CREATININE 0.80 09/11/2021   EGFR 83 09/11/2021   CALCIUM 9.5 09/11/2021   PROT 7.0 05/08/2021   ALBUMIN 4.7 05/08/2021   LABGLOB 2.3 05/08/2021   AGRATIO 2.0 05/08/2021   BILITOT 0.4 05/08/2021   ALKPHOS 93 05/08/2021   AST 21 05/08/2021   ALT 18 05/08/2021   Last lipids Lab Results  Component Value Date   CHOL 192 09/11/2021   HDL 57 09/11/2021   LDLCALC 107 (H) 09/11/2021   TRIG 160 (H) 09/11/2021   CHOLHDL 3.4 09/11/2021   Last hemoglobin A1c Lab Results  Component Value Date   HGBA1C 5.9 (H) 09/11/2021   Last thyroid functions Lab Results  Component Value Date   TSH 0.886 09/11/2021   Last vitamin D Lab Results  Component Value Date   VD25OH 24.5 (L) 09/11/2021    The 10-year ASCVD risk score (Arnett DK, et al., 2019) is: 12.8%    Assessment & Plan:   Problem List Items Addressed This Visit       Essential hypertension - Primary    BP 147/69 today.  Her current regimen includes  amlodipine 5 mg daily, losartan 100 g daily, and HCTZ 12.5 mg daily.  Sophia Robbins has been checking her blood pressure at home and keeping a log.  Systolic readings are consistently 130s. -Increase HCTZ to  25 mg daily. -Continue amlodipine and losartan at current doses -Follow-up in 2 weeks for BP check.  This can be a telephone encounter send Sophia Robbins can check and record her blood pressures at home.      Relevant Medications   hydrochlorothiazide (HYDRODIURIL) 25 MG tablet    Return in about 2 weeks (around 11/28/2021) for BP check (telephone visit).    Johnette Abraham, MD

## 2021-11-23 ENCOUNTER — Other Ambulatory Visit: Payer: Self-pay | Admitting: Nurse Practitioner

## 2021-11-23 DIAGNOSIS — E039 Hypothyroidism, unspecified: Secondary | ICD-10-CM

## 2021-11-23 DIAGNOSIS — E119 Type 2 diabetes mellitus without complications: Secondary | ICD-10-CM

## 2021-11-23 DIAGNOSIS — E785 Hyperlipidemia, unspecified: Secondary | ICD-10-CM

## 2021-11-29 ENCOUNTER — Encounter: Payer: Self-pay | Admitting: Internal Medicine

## 2021-11-29 ENCOUNTER — Ambulatory Visit (INDEPENDENT_AMBULATORY_CARE_PROVIDER_SITE_OTHER): Payer: Federal, State, Local not specified - PPO | Admitting: Internal Medicine

## 2021-11-29 DIAGNOSIS — I10 Essential (primary) hypertension: Secondary | ICD-10-CM

## 2021-11-29 NOTE — Progress Notes (Signed)
   Established Patient Telephone Visit  Virtual Visit via Telephone Note  I connected with Sophia Robbins on 11/29/21 at 11:40 AM EST by telephone and verified that I am speaking with the correct person using two identifiers.  Location: Patient: [address], [place], [place] [address] Provider: [phone] S. [address] [place], [place] [address]   I discussed the limitations, risks, security and privacy concerns of performing an evaluation and management service by telephone and the availability of in person appointments. I also discussed with the patient that there may be a patient responsible charge related to this service. The patient expressed understanding and agreed to proceed.   History of Present Illness:  Ms. Sophia Robbins is evaluated today via telephone encounter for HTN follow-up.  She is a 61 year old woman with a past medical history significant for HTN, OSA, T2DM, hypothyroidism, and HLD.  She was last seen by me on 10/26 for BP check.  HCTZ was increased to 25 mg daily at that time.  Her additional antihypertensive medications include amlodipine 5 mg daily and losartan 100 mg daily.  She has demonstrated reliability in being able to routinely check her blood pressure at home.  2-week follow-up was arranged for BP check in light of recent medication changes.  Today Ms. Sophia Robbins states that she is doing well.  She is not experiencing side effects from recent medication adjustments.  She has been checking her blood pressure multiple times daily at home.  Her systolic readings have ranged 622-297 mmHg.  She endorses a high reading of 121.  Her diastolic pressures have been in the 70s.  Assessment and Plan:  Essential Hypertension HCTZ increased to 25 mg daily at her last appointment on 10/26.  She is additionally prescribed amlodipine 5 mg daily and losartan 100 mg daily.  Her hypertension is adequately controlled on this regimen based on recent home readings.  No medication changes  today. -Follow-up in early March for routine care   Follow Up Instructions:    I discussed the assessment and treatment plan with the patient. The patient was provided an opportunity to ask questions and all were answered. The patient agreed with the plan and demonstrated an understanding of the instructions.   The patient was advised to call back or seek an in-person evaluation if the symptoms worsen or if the condition fails to improve as anticipated.  I provided 5 minutes of non-face-to-face time during this encounter.   Billie Lade, MD

## 2021-12-25 ENCOUNTER — Other Ambulatory Visit: Payer: Self-pay | Admitting: Nurse Practitioner

## 2021-12-25 DIAGNOSIS — E119 Type 2 diabetes mellitus without complications: Secondary | ICD-10-CM

## 2021-12-25 DIAGNOSIS — I10 Essential (primary) hypertension: Secondary | ICD-10-CM

## 2022-01-11 ENCOUNTER — Other Ambulatory Visit: Payer: Self-pay | Admitting: Internal Medicine

## 2022-01-11 DIAGNOSIS — I10 Essential (primary) hypertension: Secondary | ICD-10-CM

## 2022-02-15 ENCOUNTER — Other Ambulatory Visit: Payer: Self-pay | Admitting: Nurse Practitioner

## 2022-02-15 ENCOUNTER — Other Ambulatory Visit: Payer: Self-pay | Admitting: Internal Medicine

## 2022-02-15 DIAGNOSIS — I10 Essential (primary) hypertension: Secondary | ICD-10-CM

## 2022-02-15 DIAGNOSIS — E039 Hypothyroidism, unspecified: Secondary | ICD-10-CM

## 2022-03-03 ENCOUNTER — Other Ambulatory Visit: Payer: Self-pay | Admitting: Nurse Practitioner

## 2022-03-10 LAB — HM DIABETES EYE EXAM

## 2022-03-20 ENCOUNTER — Encounter: Payer: Self-pay | Admitting: Radiology

## 2022-03-20 ENCOUNTER — Encounter: Payer: Self-pay | Admitting: Internal Medicine

## 2022-03-20 ENCOUNTER — Ambulatory Visit: Payer: Federal, State, Local not specified - PPO | Attending: Internal Medicine | Admitting: Internal Medicine

## 2022-03-20 VITALS — BP 132/72 | HR 64 | Ht 63.0 in | Wt 187.6 lb

## 2022-03-20 DIAGNOSIS — E119 Type 2 diabetes mellitus without complications: Secondary | ICD-10-CM

## 2022-03-20 DIAGNOSIS — E785 Hyperlipidemia, unspecified: Secondary | ICD-10-CM

## 2022-03-20 DIAGNOSIS — Z888 Allergy status to other drugs, medicaments and biological substances status: Secondary | ICD-10-CM | POA: Diagnosis not present

## 2022-03-20 NOTE — Patient Instructions (Signed)
Medication Instructions:  NO CHANGES -- will likely have you resume Repatha   *If you need a refill on your cardiac medications before your next appointment, please call your pharmacy*   Lab Work: FASTING lab work to check cholesterol at a [employer] locations:  [place] (Dr. Lysbeth Penner office) - [place] - [address]   [place] - [address] - [address] [place] - [address])   If you have labs (blood work) drawn today and your tests are completely normal, you will receive your results only by: [employer] (if you have MyChart) OR A paper copy in the mail If you have any lab test that is abnormal or we need to change your treatment, we will call you to review the results.  Follow-Up: At [facility], you and your health needs are our priority.  As part of our continuing mission to provide you with exceptional heart care, we have created designated Provider Care Teams.  These Care Teams include your primary Cardiologist (physician) and Advanced Practice Providers (APPs -  Physician Assistants and Nurse Practitioners) who all work together to provide you with the care you need, when you need it.  We recommend signing up for the patient portal called "MyChart".  Sign up information is provided on this After Visit Summary.  MyChart is used to connect with patients for Virtual Visits (Telemedicine).  Patients are able to view lab/test results, encounter notes, upcoming appointments, etc.  Non-urgent messages can be sent to your provider as well.   To learn more about what you can do with MyChart, go to [email or web address].    Your next appointment:    4  months with Dr. Debara Pickett

## 2022-03-20 NOTE — Progress Notes (Signed)
LIPID CLINIC CONSULT NOTE  Chief Complaint:  Manage dyslipidemia  Primary Care Physician: Johnette Abraham, MD  Primary Cardiologist:  None  HPI:  Sophia Robbins is a 62 y.o. female who is being seen today for the evaluation of dyslipidemia at the request of Renee Rival, FNP. This is a pleasant 63 year old female with a history of type 2 diabetes that has improved with A1c now at 5.9%, dyslipidemia, asthma and allergies and obstructive sleep apnea on CPAP.  There is a history of heart disease in her father however not early onset.  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  Based on that she is really not a candidate for statin therapy.  Her target LDL was less than 70 and she remained well above that.  She was placed on ezetimibe but was not able to reach target and ultimately started and approved for Repatha.  She took this for a while and had good results with an LDL as low as 35 however her LDL then crept up to more recently total 192, triglycerides 160, HDL 57 and LDL 107.  She ultimately ran out of the medication and could not get it represcribed by her primary care providers who had switched.  She was then referred to the lipid clinic for further management recommendations.  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  She tries to avoid foods high in saturated fats.  PMHx:  Past Medical History:  Diagnosis Date   Allergy    Asthma    Diabetes mellitus without complication ([place])    Hyperlipidemia    OSA on CPAP     Past Surgical History:  Procedure Laterality Date   APPENDECTOMY     BIOPSY  06/26/2020   Procedure: BIOPSY;  Surgeon: Harvel Quale, MD;  [facility];  Service: Gastroenterology;;   CESAREAN SECTION     COLONOSCOPY WITH PROPOFOL N/A 06/26/2020   Procedure: COLONOSCOPY WITH PROPOFOL;  Surgeon:  Harvel Quale, MD;  [facility];  Service: Gastroenterology;  Laterality: N/A;  9:00   ESOPHAGOGASTRODUODENOSCOPY (EGD) WITH PROPOFOL N/A 06/26/2020   Procedure: ESOPHAGOGASTRODUODENOSCOPY (EGD) WITH PROPOFOL;  Surgeon: Harvel Quale, MD;  [facility];  Service: Gastroenterology;  Laterality: N/A;   EXPLORATION POST OPERATIVE OPEN HEART     KNEE ARTHROSCOPY AND ARTHROTOMY Left    TUBAL LIGATION      FAMHx:  Family History  Problem Relation Age of Onset   Cancer Mother    Lung cancer Mother    Cancer Father    Esophageal cancer Father    Diabetes Sister    Sleep apnea Sister    COPD Brother    Sleep apnea Brother    Stomach cancer Brother    Cancer - Ovarian Paternal Grandmother     SOCHx:   reports that she quit smoking about 9 years ago. Her smoking use included cigarettes. She has never used smokeless tobacco. She reports current alcohol use of about 3.0 standard drinks of alcohol per week. She reports that she does not currently use drugs.  ALLERGIES:  Allergies  Allergen Reactions   Other Itching, Rash, Shortness Of Breath and Swelling   Codeine Hives   Shellfish Allergy Hives   Statins Hives    Blisters and peeling of skin    ROS: Pertinent items noted in  HPI and remainder of comprehensive ROS otherwise negative.  HOME MEDS: Current Outpatient Medications on File Prior to Visit  Medication Sig Dispense Refill   amLODipine (NORVASC) 5 MG tablet Take 1 tablet (5 mg total) by mouth daily. 30 tablet 0   aspirin EC 81 MG tablet Take 81 mg by mouth daily.     cetirizine (ZYRTEC) 10 MG tablet Take 10 mg by mouth daily.     clobetasol ointment (TEMOVATE) 0.05 % Apply topically 2 times daily as needed (Skin Rash). 30 g 1   ezetimibe (ZETIA) 10 MG tablet TAKE 1 TABLET BY MOUTH EVERY DAY 90 tablet 2   gabapentin (NEURONTIN) 300 MG capsule TAKE 1 CAPSULE BY MOUTH TWICE A DAY 180 capsule 0   glucose blood test strip Use as  instructed 100 each 12   hydrochlorothiazide (HYDRODIURIL) 25 MG tablet TAKE 1 TABLET (25 MG TOTAL) BY MOUTH DAILY. 90 tablet 0   levothyroxine (SYNTHROID) 25 MCG tablet TAKE 2 TABLETS (50 MCG TOTAL) BY MOUTH DAILY BEFORE BREAKFAST. 180 tablet 0   losartan (COZAAR) 100 MG tablet TAKE 1 TABLET BY MOUTH EVERY DAY 90 tablet 2   benzonatate (TESSALON) 100 MG capsule Take 1 capsule (100 mg total) by mouth 2 (two) times daily as needed for cough. (Patient not taking: Reported on 03/20/2022) 20 capsule 0   Evolocumab (REPATHA SURECLICK) [id number] MG/ML SOAJ Inject 140 mg into the skin every 14 (fourteen) days. (Patient not taking: Reported on 03/20/2022) 2 mL 11   fluticasone (FLOVENT HFA) 110 MCG/ACT inhaler Inhale 2 puffs into the lungs daily as needed (Shortness of breath). (Patient not taking: Reported on 03/20/2022)     nystatin cream (MYCOSTATIN) Apply 1 application  topically daily as needed for dry skin. (Patient not taking: Reported on 03/20/2022)     triamcinolone cream (KENALOG) 0.1 % Apply 1 application  topically 2 (two) times daily as needed (Rash). (Patient not taking: Reported on 03/20/2022)     No current facility-administered medications on file prior to visit.    LABS/IMAGING: No results found for this or any previous visit (from the past 48 hour(s)). No results found.  LIPID PANEL:    Component Value Date/Time   CHOL 192 09/11/2021 0806   TRIG 160 (H) 09/11/2021 0806   HDL 57 09/11/2021 0806   CHOLHDL 3.4 09/11/2021 0806   CHOLHDL 4.4 10/27/2019 0903   LDLCALC 107 (H) 09/11/2021 0806   LDLCALC 156 (H) 10/27/2019 0903    WEIGHTS: Wt Readings from Last 3 Encounters:  03/20/22 187 lb 9.6 oz (85.1 kg)  11/14/21 170 lb 9.6 oz (77.4 kg)  10/17/21 174 lb (78.9 kg)    VITALS: BP 132/72 (BP Location: Left Arm, Patient Position: Sitting, Cuff Size: Normal)   Pulse 64   Ht '5\' 3"'$  (1.6 m)   Wt 187 lb 9.6 oz (85.1 kg)   SpO2 96%   BMI 33.23 kg/m    EXAM: Deferred  EKG: Deferred  ASSESSMENT: Mixed dyslipidemia, goal LDL<70 DM2 - A1c 5.9% 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   Sophia Robbins is statin allergic and has previously done well on a combination of Repatha and ezetimibe, however, her prior authorization for Repatha had run out and was not renewed by her PCP.  She still remains on ezetimibe.  I like to repeat her labs and we will reapply for the [place].  Check an NMR and LP(a).  Although there is family history of heart disease in her father she has  no known cardiovascular disease.  We may want to do a screening calcium score at some point to look for this.  Follow-up in 3-4 months.  Pixie Casino, MD, [facility], [place] Director of the Advanced Lipid Disorders &  Cardiovascular Risk Reduction Clinic Diplomate of the American Board of Clinical Lipidology Attending Cardiologist  Direct Dial: [phone]  Fax: [phone]  Website:  [email or web address]  Nadean Corwin Coran Dipaola 03/20/2022, 8:20 AM

## 2022-03-21 DIAGNOSIS — E785 Hyperlipidemia, unspecified: Secondary | ICD-10-CM | POA: Diagnosis not present

## 2022-03-25 LAB — LIPOPROTEIN A (LPA): Lipoprotein (a): 13.6 nmol/L (ref <75.0)

## 2022-03-25 LAB — NMR, LIPOPROFILE
Cholesterol, Total: 255 mg/dL — ABNORMAL HIGH (ref 100–199)
HDL Particle Number: 39.2 umol/L (ref >=30.5)
HDL-C: 62 mg/dL (ref >39)
LDL Particle Number: 1631 nmol/L — ABNORMAL HIGH (ref <1000)
LDL Size: 21.3 nm (ref >20.5)
LDL-C (NIH Calc): 164 mg/dL — ABNORMAL HIGH (ref 0–99)
LP-IR Score: 49 — ABNORMAL HIGH (ref <=45)
Small LDL Particle Number: 694 nmol/L — ABNORMAL HIGH (ref <=527)
Triglycerides: 162 mg/dL — ABNORMAL HIGH (ref 0–149)

## 2022-03-26 ENCOUNTER — Other Ambulatory Visit: Payer: Self-pay | Admitting: Internal Medicine

## 2022-03-26 ENCOUNTER — Other Ambulatory Visit: Payer: Self-pay | Admitting: Nurse Practitioner

## 2022-03-26 DIAGNOSIS — I10 Essential (primary) hypertension: Secondary | ICD-10-CM

## 2022-03-27 ENCOUNTER — Other Ambulatory Visit: Payer: Self-pay | Admitting: *Deleted

## 2022-03-27 DIAGNOSIS — E785 Hyperlipidemia, unspecified: Secondary | ICD-10-CM

## 2022-03-27 MED ORDER — REPATHA SURECLICK 140 MG/ML ~~LOC~~ SOAJ: 140.0000 mg | SUBCUTANEOUS | 3 refills | Status: DC

## 2022-04-03 ENCOUNTER — Encounter: Payer: Self-pay | Admitting: Internal Medicine

## 2022-04-03 ENCOUNTER — Ambulatory Visit: Payer: Federal, State, Local not specified - PPO | Admitting: Internal Medicine

## 2022-04-03 VITALS — BP 148/77 | HR 61 | Ht 63.0 in | Wt 184.4 lb

## 2022-04-03 DIAGNOSIS — E785 Hyperlipidemia, unspecified: Secondary | ICD-10-CM

## 2022-04-03 DIAGNOSIS — R600 Localized edema: Secondary | ICD-10-CM | POA: Diagnosis not present

## 2022-04-03 DIAGNOSIS — E118 Type 2 diabetes mellitus with unspecified complications: Secondary | ICD-10-CM | POA: Diagnosis not present

## 2022-04-03 DIAGNOSIS — I10 Essential (primary) hypertension: Secondary | ICD-10-CM

## 2022-04-03 LAB — POCT GLYCOSYLATED HEMOGLOBIN (HGB A1C): HbA1c, POC (controlled diabetic range): 8.4 % — AB (ref 0.0–7.0)

## 2022-04-03 MED ORDER — METFORMIN HCL ER 500 MG PO TB24: ORAL_TABLET | ORAL | 0 refills | Status: DC

## 2022-04-03 MED ORDER — CHLORTHALIDONE 25 MG PO TABS: 25.0000 mg | ORAL_TABLET | Freq: Every day | ORAL | 2 refills | Status: DC

## 2022-04-03 NOTE — Assessment & Plan Note (Signed)
BP is mildly elevated today, 150/75 initially and 148/77 on repeat.  She is currently prescribed amlodipine 7.5 mg daily, HCTZ 25 mg daily, and losartan 100 mg daily.  She endorses symptoms of persistent, nonpitting bilateral lower extremity edema, which could be a side effect of amlodipine. -Discontinue amlodipine and HCTZ -Start chlorthalidone 25 mg daily -Continue losartan 100 mg daily -Follow-up in 4 weeks for BP check

## 2022-04-03 NOTE — Assessment & Plan Note (Signed)
She has establish care in the lipid clinic.  Followed by Dr. Debara Pickett.  She reports that she will be starting Repatha soon.

## 2022-04-03 NOTE — Assessment & Plan Note (Addendum)
Her acute concern today is nonpitting, bilateral lower extremity edema that has been persistent for the past 6 months.  She is currently wearing compression stockings.  Edema is worse at the end of the day and resolves with leg elevation.  She denies dyspnea on exertion and orthopnea/PND. -We discussed that this is likely venous insufficiency, but could also be a side effect of amlodipine.  Amlodipine has been discontinued.  I have recommended continued use of compression stockings and leg elevation as able.  She is also encouraged to increase her exercise routine. -Follow-up in 4 weeks

## 2022-04-03 NOTE — Assessment & Plan Note (Addendum)
Previously well-controlled on diet alone.  POC A1c today increased to 8.4. -Restart metformin XR and gradually increase dose to 1000 mg twice daily -Diabetic eye exam recently completed -Follow-up in 4 weeks

## 2022-04-03 NOTE — Progress Notes (Signed)
Established Patient Office Visit  Subjective   Patient ID: Sophia Robbins, female    DOB: April 03, 1960  Age: 62 y.o. MRN: [id number]  Chief Complaint  Patient presents with   Hypertension    Follow up   Sophia Robbins returns to care today for routine follow-up.  She was last evaluated by me on 11/29/21 through video encounter for HTN follow-up.  In the interim she has establish care with Dr. Debara Pickett in the lipid clinic.  There have otherwise been no acute interval events. Sophia Robbins reports feeling fairly well today.  Her acute concern is persistent bilateral lower extremity edema.  Edema is worse at the end of the day and resolves with leg elevation.  She denies symptoms of dyspnea on exertion and orthopnea/PND.  Her symptoms have not worsened recently, but have persisted for the past 6 months..  She is otherwise asymptomatic and has no additional concerns to discuss today.  Past Medical History:  Diagnosis Date   Allergy    Asthma    Diabetes mellitus without complication ([place])    Hyperlipidemia    OSA on CPAP    Past Surgical History:  Procedure Laterality Date   APPENDECTOMY     BIOPSY  06/26/2020   Procedure: BIOPSY;  Surgeon: Harvel Quale, MD;  [facility];  Service: Gastroenterology;;   CESAREAN SECTION     COLONOSCOPY WITH PROPOFOL N/A 06/26/2020   Procedure: COLONOSCOPY WITH PROPOFOL;  Surgeon: Harvel Quale, MD;  [facility];  Service: Gastroenterology;  Laterality: N/A;  9:00   ESOPHAGOGASTRODUODENOSCOPY (EGD) WITH PROPOFOL N/A 06/26/2020   Procedure: ESOPHAGOGASTRODUODENOSCOPY (EGD) WITH PROPOFOL;  Surgeon: Harvel Quale, MD;  [facility];  Service: Gastroenterology;  Laterality: N/A;   EXPLORATION POST OPERATIVE OPEN HEART     KNEE ARTHROSCOPY AND ARTHROTOMY Left    TUBAL LIGATION     Social History   Tobacco Use   Smoking status: Former    Types: Cigarettes    Quit date: 06/20/2012    Years since  quitting: 9.7   Smokeless tobacco: Never  Vaping Use   Vaping Use: Never used  Substance Use Topics   Alcohol use: Yes    Alcohol/week: 3.0 standard drinks of alcohol    Types: 3 Glasses of wine per week    Comment: occasional   Drug use: Not Currently   Family History  Problem Relation Age of Onset   Cancer Mother    Lung cancer Mother    Cancer Father    Esophageal cancer Father    Diabetes Sister    Sleep apnea Sister    COPD Brother    Sleep apnea Brother    Stomach cancer Brother    Cancer - Ovarian Paternal Grandmother    Allergies  Allergen Reactions   Other Itching, Rash, Shortness Of Breath and Swelling   Codeine Hives   Shellfish Allergy Hives   Statins Hives    Blisters and peeling of skin   Review of Systems  Constitutional:  Negative for chills and fever.  HENT:  Negative for sore throat.   Respiratory:  Negative for cough and shortness of breath.   Cardiovascular:  Positive for leg swelling. Negative for chest pain and palpitations.  Gastrointestinal:  Negative for abdominal pain, blood in stool, constipation, diarrhea, nausea and vomiting.  Genitourinary:  Negative for dysuria and hematuria.  Musculoskeletal:  Negative for myalgias.  Skin:  Negative for itching and rash.  Neurological:  Negative for  dizziness and headaches.  Psychiatric/Behavioral:  Negative for depression and suicidal ideas.      Objective:     BP (!) 148/77   Pulse 61   Ht '5\' 3"'$  (1.6 m)   Wt 184 lb 6.4 oz (83.6 kg)   SpO2 93%   BMI 32.66 kg/m  BP Readings from Last 3 Encounters:  04/03/22 (!) 148/77  03/20/22 132/72  11/14/21 (!) 147/69   Physical Exam Vitals reviewed.  Constitutional:      General: She is not in acute distress.    Appearance: Normal appearance. She is obese. She is not toxic-appearing.  HENT:     Head: Normocephalic and atraumatic.     Right Ear: External ear normal.     Left Ear: External ear normal.     Nose: Nose normal. No congestion or  rhinorrhea.     Mouth/Throat:     Mouth: Mucous membranes are moist.     Pharynx: Oropharynx is clear. No oropharyngeal exudate or posterior oropharyngeal erythema.  Eyes:     General: No scleral icterus.    Extraocular Movements: Extraocular movements intact.     Conjunctiva/sclera: Conjunctivae normal.     Pupils: Pupils are equal, round, and reactive to light.  Cardiovascular:     Rate and Rhythm: Normal rate and regular rhythm.     Pulses: Normal pulses.     Heart sounds: Murmur heard.     No friction rub. No gallop.  Pulmonary:     Effort: Pulmonary effort is normal.     Breath sounds: Normal breath sounds. No wheezing, rhonchi or rales.  Abdominal:     General: Abdomen is flat. Bowel sounds are normal. There is no distension.     Palpations: Abdomen is soft.     Tenderness: There is no abdominal tenderness.  Musculoskeletal:        General: Swelling (Nonpitting bilateral lower extremity edema) present. Normal range of motion.     Cervical back: Normal range of motion.     Right lower leg: Edema present.     Left lower leg: Edema present.  Lymphadenopathy:     Cervical: No cervical adenopathy.  Skin:    General: Skin is warm and dry.     Capillary Refill: Capillary refill takes less than 2 seconds.     Coloration: Skin is not jaundiced.  Neurological:     General: No focal deficit present.     Mental Status: She is alert and oriented to person, place, and time.  Psychiatric:        Mood and Affect: Mood normal.        Behavior: Behavior normal.   Last CBC Lab Results  Component Value Date   WBC 3.7 05/08/2021   HGB 14.2 05/08/2021   HCT 40.3 05/08/2021   MCV 89 05/08/2021   MCH 31.4 05/08/2021   RDW 12.3 05/08/2021   PLT 243 [id number]   Last metabolic panel Lab Results  Component Value Date   GLUCOSE 130 (H) 09/11/2021   NA 139 09/11/2021   K 5.2 09/11/2021   CL 103 09/11/2021   CO2 24 09/11/2021   BUN 14 09/11/2021   CREATININE 0.80 09/11/2021   EGFR  83 09/11/2021   CALCIUM 9.5 09/11/2021   PROT 7.0 05/08/2021   ALBUMIN 4.7 05/08/2021   LABGLOB 2.3 05/08/2021   AGRATIO 2.0 05/08/2021   BILITOT 0.4 05/08/2021   ALKPHOS 93 05/08/2021   AST 21 05/08/2021   ALT 18 05/08/2021   Last  lipids Lab Results  Component Value Date   CHOL 192 09/11/2021   HDL 57 09/11/2021   LDLCALC 107 (H) 09/11/2021   TRIG 160 (H) 09/11/2021   CHOLHDL 3.4 09/11/2021   Last hemoglobin A1c Lab Results  Component Value Date   HGBA1C 8.4 (A) 04/03/2022   Last thyroid functions Lab Results  Component Value Date   TSH 0.886 09/11/2021   Last vitamin D Lab Results  Component Value Date   VD25OH 24.5 (L) 09/11/2021   The 10-year ASCVD risk score (Arnett DK, et al., 2019) is: 16.9%    Assessment & Plan:   Problem List Items Addressed This Visit       Essential hypertension - Primary    BP is mildly elevated today, 150/75 initially and 148/77 on repeat.  She is currently prescribed amlodipine 7.5 mg daily, HCTZ 25 mg daily, and losartan 100 mg daily.  She endorses symptoms of persistent, nonpitting bilateral lower extremity edema, which could be a side effect of amlodipine. -Discontinue amlodipine and HCTZ -Start chlorthalidone 25 mg daily -Continue losartan 100 mg daily -Follow-up in 4 weeks for BP check      Controlled diabetes mellitus type 2 with complications ([facility])    Previously well-controlled on diet alone.  POC A1c today increased to 8.4. -Restart metformin XR and gradually increase dose to 1000 mg twice daily -Diabetic eye exam recently completed -Follow-up in 4 weeks      Hyperlipidemia LDL goal <70    She has establish care in the lipid clinic.  Followed by Dr. Debara Pickett.  She reports that she will be starting Repatha soon.      Lower extremity edema    Her acute concern today is nonpitting, bilateral lower extremity edema that has been persistent for the past 6 months.  She is currently wearing compression stockings.  Edema is  worse at the end of the day and resolves with leg elevation.  She denies dyspnea on exertion and orthopnea/PND. -We discussed that this is likely venous insufficiency, but could also be a side effect of amlodipine.  Amlodipine has been discontinued.  I have recommended continued use of compression stockings and leg elevation as able.  She is also encouraged to increase her exercise routine. -Follow-up in 4 weeks       Return in about 4 weeks (around 05/01/2022) for HTN, repeat labs.    Johnette Abraham, MD

## 2022-04-03 NOTE — Patient Instructions (Signed)
It was a pleasure to see you today.  Thank you for giving [place] the opportunity to be involved in your care.  Below is a brief recap of your visit and next steps.  We will plan to see you again in 1 month.  Summary Stop amlodipine and HCTZ Start chlorthalidone 25 mg daily Follow up in 1 month for BP check and repeat labs

## 2022-04-28 ENCOUNTER — Other Ambulatory Visit: Payer: Self-pay | Admitting: Internal Medicine

## 2022-04-28 DIAGNOSIS — E118 Type 2 diabetes mellitus with unspecified complications: Secondary | ICD-10-CM

## 2022-05-12 ENCOUNTER — Ambulatory Visit: Payer: Federal, State, Local not specified - PPO | Admitting: Internal Medicine

## 2022-05-12 ENCOUNTER — Other Ambulatory Visit (HOSPITAL_COMMUNITY): Payer: Self-pay | Admitting: Internal Medicine

## 2022-05-12 ENCOUNTER — Other Ambulatory Visit: Payer: Self-pay

## 2022-05-12 ENCOUNTER — Encounter: Payer: Self-pay | Admitting: Internal Medicine

## 2022-05-12 VITALS — BP 124/72 | HR 65 | Ht 63.0 in | Wt 178.2 lb

## 2022-05-12 DIAGNOSIS — E559 Vitamin D deficiency, unspecified: Secondary | ICD-10-CM | POA: Diagnosis not present

## 2022-05-12 DIAGNOSIS — E039 Hypothyroidism, unspecified: Secondary | ICD-10-CM

## 2022-05-12 DIAGNOSIS — R1013 Epigastric pain: Secondary | ICD-10-CM | POA: Insufficient documentation

## 2022-05-12 DIAGNOSIS — E118 Type 2 diabetes mellitus with unspecified complications: Secondary | ICD-10-CM

## 2022-05-12 DIAGNOSIS — N6310 Unspecified lump in the right breast, unspecified quadrant: Secondary | ICD-10-CM

## 2022-05-12 DIAGNOSIS — I10 Essential (primary) hypertension: Secondary | ICD-10-CM

## 2022-05-12 DIAGNOSIS — E785 Hyperlipidemia, unspecified: Secondary | ICD-10-CM | POA: Diagnosis not present

## 2022-05-12 MED ORDER — PANTOPRAZOLE SODIUM 40 MG PO TBEC
40.0000 mg | DELAYED_RELEASE_TABLET | Freq: Every day | ORAL | 2 refills | Status: DC | PRN
Start: 2022-05-12 — End: 2022-08-08

## 2022-05-12 MED ORDER — LEVOTHYROXINE SODIUM 25 MCG PO TABS
50.0000 ug | ORAL_TABLET | Freq: Every day | ORAL | 0 refills | Status: DC
Start: 2022-05-12 — End: 2022-08-11

## 2022-05-12 NOTE — Assessment & Plan Note (Signed)
Metformin XR was restarted at her last appointment as her A1c had increased to 8.4.  She has not experienced any adverse side effects since restarting metformin. -No medication changes today -Urine microalbumin/creatinine ratio ordered

## 2022-05-12 NOTE — Patient Instructions (Signed)
It was a pleasure to see you today.  Thank you for giving [place] the opportunity to be involved in your care.  Below is a brief recap of your visit and next steps.  We will plan to see you again in 6 months.  Summary Try pantoprazole for indigestion relief No additional medication changes Repeat labs today Follow up in 6 months

## 2022-05-12 NOTE — Assessment & Plan Note (Signed)
Presents today for HTN follow-up.  Amlodipine and HCTZ were discontinued at her last appointment.  Chlorthalidone 25 mg daily was started.  She is additionally prescribed losartan 100 mg daily.  BP is well-controlled currently, 124/72.  Lower extremity edema has resolved. -No medication changes today.  Continue current antihypertensive regimen. -Repeat labs ordered today

## 2022-05-12 NOTE — Assessment & Plan Note (Signed)
She describes recent symptoms of dyspepsia.  Not currently on any antacid medication. -Start Protonix 40 mg as needed for symptom relief

## 2022-05-12 NOTE — Progress Notes (Signed)
Established Patient Office Visit  Subjective   Patient ID: Sophia Robbins, female    DOB: 1960/08/02  Age: 62 y.o. MRN: [id number]  Chief Complaint  Patient presents with   Hypertension    Four week follow up. Patient is having indigestion and lack of appetite    Sophia Robbins returns to care today for HTN follow-up.  She was last evaluated by me on 3/14 at which time her blood pressure was elevated and she endorsed bilateral lower extremity edema.  Amlodipine and HCTZ were discontinued and chlorthalidone 25 mg daily was started.  4-week follow-up was arranged.  Metformin was also restarted in the setting of uncontrolled diabetes mellitus.  4-week follow-up was arranged.  There have been no acute interval events.Sophia Robbins reports feeling fairly well today.  She states that her lower extremity edema has resolved and her blood pressure readings have been well within goal at home.  Her acute concern today is symptoms of indigestion and bloating that have occurred over the last week.  She is not currently taking any antacid medication.  Denies symptoms of diarrhea.  Unable to associate with any particular foods.  Past Medical History:  Diagnosis Date   Allergy    Asthma    Diabetes mellitus without complication    Hyperlipidemia    OSA on CPAP    Past Surgical History:  Procedure Laterality Date   APPENDECTOMY     BIOPSY  06/26/2020   Procedure: BIOPSY;  Surgeon: Dolores Frame, MD;  [facility];  Service: Gastroenterology;;   CESAREAN SECTION     COLONOSCOPY WITH PROPOFOL N/A 06/26/2020   Procedure: COLONOSCOPY WITH PROPOFOL;  Surgeon: Dolores Frame, MD;  [facility];  Service: Gastroenterology;  Laterality: N/A;  9:00   ESOPHAGOGASTRODUODENOSCOPY (EGD) WITH PROPOFOL N/A 06/26/2020   Procedure: ESOPHAGOGASTRODUODENOSCOPY (EGD) WITH PROPOFOL;  Surgeon: Dolores Frame, MD;  [facility];  Service: Gastroenterology;  Laterality:  N/A;   EXPLORATION POST OPERATIVE OPEN HEART     KNEE ARTHROSCOPY AND ARTHROTOMY Left    TUBAL LIGATION     Social History   Tobacco Use   Smoking status: Former    Types: Cigarettes    Quit date: 06/20/2012    Years since quitting: 9.8   Smokeless tobacco: Never  Vaping Use   Vaping Use: Never used  Substance Use Topics   Alcohol use: Yes    Alcohol/week: 3.0 standard drinks of alcohol    Types: 3 Glasses of wine per week    Comment: occasional   Drug use: Not Currently   Family History  Problem Relation Age of Onset   Cancer Mother    Lung cancer Mother    Cancer Father    Esophageal cancer Father    Diabetes Sister    Sleep apnea Sister    COPD Brother    Sleep apnea Brother    Stomach cancer Brother    Cancer - Ovarian Paternal Grandmother    Allergies  Allergen Reactions   Other Itching, Rash, Shortness Of Breath and Swelling   Codeine Hives   Shellfish Allergy Hives   Statins Hives    Blisters and peeling of skin   Review of Systems  Gastrointestinal:  Positive for heartburn.  All other systems reviewed and are negative.    Objective:     BP 124/72 (BP Location: Right Arm, Patient Position: Sitting, Cuff Size: Normal)   Pulse 65   Ht 5\' 3"  (1.6 m)  Wt 178 lb 3.2 oz (80.8 kg)   SpO2 95%   BMI 31.57 kg/m  BP Readings from Last 3 Encounters:  05/12/22 124/72  04/03/22 (!) 148/77  03/20/22 132/72   Physical Exam Constitutional:      General: She is not in acute distress.    Appearance: Normal appearance. She is normal weight. She is not toxic-appearing.  HENT:     Head: Normocephalic and atraumatic.  Cardiovascular:     Rate and Rhythm: Normal rate and regular rhythm.     Pulses: Normal pulses.     Heart sounds: Murmur heard.  Pulmonary:     Effort: Pulmonary effort is normal.     Breath sounds: Normal breath sounds. No wheezing, rhonchi or rales.  Abdominal:     General: Abdomen is flat. Bowel sounds are normal. There is no distension.      Palpations: Abdomen is soft.     Tenderness: There is no abdominal tenderness.  Musculoskeletal:        General: No swelling.     Right lower leg: No edema.     Left lower leg: No edema.  Skin:    General: Skin is warm and dry.     Coloration: Skin is not jaundiced.  Neurological:     Mental Status: She is alert.   Last CBC Lab Results  Component Value Date   WBC 3.7 05/08/2021   HGB 14.2 05/08/2021   HCT 40.3 05/08/2021   MCV 89 05/08/2021   MCH 31.4 05/08/2021   RDW 12.3 05/08/2021   PLT 243 05/08/2021   Last metabolic panel Lab Results  Component Value Date   GLUCOSE 130 (H) 09/11/2021   NA 139 09/11/2021   K 5.2 09/11/2021   CL 103 09/11/2021   CO2 24 09/11/2021   BUN 14 09/11/2021   CREATININE 0.80 09/11/2021   EGFR 83 09/11/2021   CALCIUM 9.5 09/11/2021   PROT 7.0 05/08/2021   ALBUMIN 4.7 05/08/2021   LABGLOB 2.3 05/08/2021   AGRATIO 2.0 05/08/2021   BILITOT 0.4 05/08/2021   ALKPHOS 93 05/08/2021   AST 21 05/08/2021   ALT 18 05/08/2021   Last lipids Lab Results  Component Value Date   CHOL 192 09/11/2021   HDL 57 09/11/2021   LDLCALC 107 (H) 09/11/2021   TRIG 160 (H) 09/11/2021   CHOLHDL 3.4 09/11/2021   Last hemoglobin A1c Lab Results  Component Value Date   HGBA1C 8.4 (A) 04/03/2022   Last thyroid functions Lab Results  Component Value Date   TSH 0.886 09/11/2021   Last vitamin D Lab Results  Component Value Date   VD25OH 24.5 (L) 09/11/2021    The 10-year ASCVD risk score (Arnett DK, et al., 2019) is: 12.2%    Assessment & Plan:   Problem List Items Addressed This Visit       Essential hypertension - Primary    Presents today for HTN follow-up.  Amlodipine and HCTZ were discontinued at her last appointment.  Chlorthalidone 25 mg daily was started.  She is additionally prescribed losartan 100 mg daily.  BP is well-controlled currently, 124/72.  Lower extremity edema has resolved. -No medication changes today.  Continue current  antihypertensive regimen. -Repeat labs ordered today       Controlled diabetes mellitus type 2 with complications    Metformin XR was restarted at her last appointment as her A1c had increased to 8.4.  She has not experienced any adverse side effects since restarting metformin. -No medication changes  today -Urine microalbumin/creatinine ratio ordered      Dyspepsia    She describes recent symptoms of dyspepsia.  Not currently on any antacid medication. -Start Protonix 40 mg as needed for symptom relief       Return in about 6 months (around 11/11/2022).    Billie Lade, MD

## 2022-05-13 LAB — CBC WITH DIFFERENTIAL/PLATELET
Basophils Absolute: 0.1 10*3/uL (ref 0.0–0.2)
EOS (ABSOLUTE): 0.3 10*3/uL (ref 0.0–0.4)
Eos: 5 %
Hematocrit: 43.9 % (ref 34.0–46.6)
Immature Grans (Abs): 0 10*3/uL (ref 0.0–0.1)
Lymphocytes Absolute: 2.2 10*3/uL (ref 0.7–3.1)
MCH: 30.9 pg (ref 26.6–33.0)
Monocytes: 7 %
Neutrophils: 48 %
RDW: 12 % (ref 11.7–15.4)
WBC: 5.6 10*3/uL (ref 3.4–10.8)

## 2022-05-13 LAB — CMP14+EGFR
ALT: 61 IU/L — ABNORMAL HIGH (ref 0–32)
Bilirubin Total: 0.3 mg/dL (ref 0.0–1.2)
Glucose: 122 mg/dL — ABNORMAL HIGH (ref 70–99)
Total Protein: 7.2 g/dL (ref 6.0–8.5)

## 2022-05-13 LAB — MICROALBUMIN / CREATININE URINE RATIO

## 2022-05-14 LAB — CMP14+EGFR
AST: 51 IU/L — ABNORMAL HIGH (ref 0–40)
Albumin/Globulin Ratio: 2.3 — ABNORMAL HIGH (ref 1.2–2.2)
Albumin: 5 g/dL — ABNORMAL HIGH (ref 3.9–4.9)
Alkaline Phosphatase: 88 IU/L (ref 44–121)
BUN/Creatinine Ratio: 29 — ABNORMAL HIGH (ref 12–28)
BUN: 32 mg/dL — ABNORMAL HIGH (ref 8–27)
CO2: 24 mmol/L (ref 20–29)
Calcium: 10.4 mg/dL — ABNORMAL HIGH (ref 8.7–10.3)
Chloride: 97 mmol/L (ref 96–106)
Creatinine, Ser: 1.09 mg/dL — ABNORMAL HIGH (ref 0.57–1.00)
Globulin, Total: 2.2 g/dL (ref 1.5–4.5)
Potassium: 4.1 mmol/L (ref 3.5–5.2)
Sodium: 139 mmol/L (ref 134–144)
eGFR: 57 mL/min/{1.73_m2} — ABNORMAL LOW (ref >59)

## 2022-05-14 LAB — CBC WITH DIFFERENTIAL/PLATELET
Basos: 1 %
Hemoglobin: 14.9 g/dL (ref 11.1–15.9)
Immature Granulocytes: 0 %
Lymphs: 39 %
MCHC: 33.9 g/dL (ref 31.5–35.7)
MCV: 91 fL (ref 79–97)
Monocytes Absolute: 0.4 10*3/uL (ref 0.1–0.9)
Neutrophils Absolute: 2.7 10*3/uL (ref 1.4–7.0)
Platelets: 248 10*3/uL (ref 150–450)
RBC: 4.82 x10E6/uL (ref 3.77–5.28)

## 2022-05-14 LAB — MICROALBUMIN / CREATININE URINE RATIO
Creatinine, Urine: 146.7 mg/dL
Microalb/Creat Ratio: 4 mg/g creat (ref 0–29)

## 2022-05-14 LAB — TSH+FREE T4
Free T4: 1.59 ng/dL (ref 0.82–1.77)
TSH: 4.43 u[IU]/mL (ref 0.450–4.500)

## 2022-05-14 LAB — B12 AND FOLATE PANEL
Folate: >20 ng/mL (ref >3.0)
Vitamin B-12: 484 pg/mL (ref 232–1245)

## 2022-05-14 LAB — VITAMIN D 25 HYDROXY (VIT D DEFICIENCY, FRACTURES): Vit D, 25-Hydroxy: 24.2 ng/mL — ABNORMAL LOW (ref 30.0–100.0)

## 2022-05-19 ENCOUNTER — Encounter (HOSPITAL_COMMUNITY): Payer: Federal, State, Local not specified - PPO

## 2022-05-19 DIAGNOSIS — Z1231 Encounter for screening mammogram for malignant neoplasm of breast: Secondary | ICD-10-CM

## 2022-05-22 ENCOUNTER — Encounter (HOSPITAL_COMMUNITY): Payer: Self-pay

## 2022-05-22 ENCOUNTER — Ambulatory Visit (HOSPITAL_COMMUNITY)
Admission: RE | Admit: 2022-05-22 | Discharge: 2022-05-22 | Disposition: A | Discharge status: Home / Self Care | Payer: Federal, State, Local not specified - PPO | Source: Ambulatory Visit | Attending: Internal Medicine | Admitting: Internal Medicine

## 2022-05-22 DIAGNOSIS — N6310 Unspecified lump in the right breast, unspecified quadrant: Secondary | ICD-10-CM

## 2022-05-22 DIAGNOSIS — R92323 Mammographic fibroglandular density, bilateral breasts: Secondary | ICD-10-CM | POA: Diagnosis not present

## 2022-06-12 ENCOUNTER — Other Ambulatory Visit: Payer: Self-pay | Admitting: Internal Medicine

## 2022-06-12 DIAGNOSIS — I10 Essential (primary) hypertension: Secondary | ICD-10-CM

## 2022-06-20 ENCOUNTER — Other Ambulatory Visit: Payer: Self-pay | Admitting: Internal Medicine

## 2022-06-22 ENCOUNTER — Other Ambulatory Visit: Payer: Self-pay | Admitting: Internal Medicine

## 2022-06-22 DIAGNOSIS — I10 Essential (primary) hypertension: Secondary | ICD-10-CM

## 2022-07-15 ENCOUNTER — Other Ambulatory Visit: Payer: Self-pay | Admitting: Internal Medicine

## 2022-07-15 DIAGNOSIS — E118 Type 2 diabetes mellitus with unspecified complications: Secondary | ICD-10-CM

## 2022-07-31 DIAGNOSIS — E785 Hyperlipidemia, unspecified: Secondary | ICD-10-CM | POA: Diagnosis not present

## 2022-08-02 LAB — NMR, LIPOPROFILE
Cholesterol, Total: 125 mg/dL (ref 100–199)
HDL Particle Number: 45.4 umol/L (ref >=30.5)
HDL-C: 51 mg/dL (ref >39)
LDL Particle Number: 539 nmol/L (ref <1000)
LDL Size: 19.6 nm — ABNORMAL LOW (ref >20.5)
LDL-C (NIH Calc): 43 mg/dL (ref 0–99)
LP-IR Score: 66 — ABNORMAL HIGH (ref <=45)
Small LDL Particle Number: 459 nmol/L (ref <=527)
Triglycerides: 196 mg/dL — ABNORMAL HIGH (ref 0–149)

## 2022-08-08 ENCOUNTER — Ambulatory Visit: Payer: Federal, State, Local not specified - PPO | Attending: Internal Medicine | Admitting: Internal Medicine

## 2022-08-08 ENCOUNTER — Other Ambulatory Visit: Payer: Self-pay | Admitting: Internal Medicine

## 2022-08-08 VITALS — BP 124/66 | HR 80 | Ht 63.0 in | Wt 179.6 lb

## 2022-08-08 DIAGNOSIS — E785 Hyperlipidemia, unspecified: Secondary | ICD-10-CM

## 2022-08-08 DIAGNOSIS — E119 Type 2 diabetes mellitus without complications: Secondary | ICD-10-CM | POA: Diagnosis not present

## 2022-08-08 DIAGNOSIS — Z888 Allergy status to other drugs, medicaments and biological substances status: Secondary | ICD-10-CM

## 2022-08-08 DIAGNOSIS — R1013 Epigastric pain: Secondary | ICD-10-CM

## 2022-08-08 NOTE — Progress Notes (Signed)
LIPID CLINIC CONSULT NOTE  Chief Complaint:  Manage dyslipidemia  Primary Care Physician: Billie Lade, MD  Primary Cardiologist:  None  HPI:  Sophia Robbins is a 62 y.o. female who is being seen today for the evaluation of dyslipidemia at the request of Billie Lade, MD. This is a pleasant 62 year old female with a history of type 2 diabetes that has improved with A1c now at 5.9%, dyslipidemia, asthma and allergies and obstructive sleep apnea on CPAP.  There is a history of heart disease in her father however not early onset.  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  Based on that she is really not a candidate for statin therapy.  Her target LDL was less than 70 and she remained well above that.  She was placed on ezetimibe but was not able to reach target and ultimately started and approved for Repatha.  She took this for a while and had good results with an LDL as low as 35 however her LDL then crept up to more recently total 192, triglycerides 160, HDL 57 and LDL 107.  She ultimately ran out of the medication and could not get it represcribed by her primary care providers who had switched.  She was then referred to the lipid clinic for further management recommendations.  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  She tries to avoid foods high in saturated fats.  08/08/2022  Sophia Robbins returns today for follow-up. Her cholesterol is much improved now on Repatha. She is tolerating it well. No reactions so far. Her LDL-P is now 539 with LDL-C of 43, HDL of 51 and TG 196.  Small LDL-P is 459.    PMHx:  Past Medical History:  Diagnosis Date   Allergy    Asthma    Diabetes mellitus without complication (HCC)    Hyperlipidemia    OSA on CPAP     Past Surgical History:  Procedure Laterality Date   APPENDECTOMY     BIOPSY  06/26/2020    Procedure: BIOPSY;  Surgeon: Dolores Frame, MD;  [facility];  Service: Gastroenterology;;   CESAREAN SECTION     COLONOSCOPY WITH PROPOFOL N/A 06/26/2020   Procedure: COLONOSCOPY WITH PROPOFOL;  Surgeon: Dolores Frame, MD;  [facility];  Service: Gastroenterology;  Laterality: N/A;  9:00   ESOPHAGOGASTRODUODENOSCOPY (EGD) WITH PROPOFOL N/A 06/26/2020   Procedure: ESOPHAGOGASTRODUODENOSCOPY (EGD) WITH PROPOFOL;  Surgeon: Dolores Frame, MD;  [facility];  Service: Gastroenterology;  Laterality: N/A;   EXPLORATION POST OPERATIVE OPEN HEART     KNEE ARTHROSCOPY AND ARTHROTOMY Left    TUBAL LIGATION      FAMHx:  Family History  Problem Relation Age of Onset   Cancer Mother    Lung cancer Mother    Cancer Father    Esophageal cancer Father    Diabetes Sister    Sleep apnea Sister    COPD Brother    Sleep apnea Brother    Stomach cancer Brother    Cancer - Ovarian Paternal Grandmother     SOCHx:   reports that she quit smoking about 10 years ago. Her smoking use included cigarettes. She has never used smokeless tobacco. She reports current alcohol use of about 3.0 standard drinks of alcohol per week. She reports that she does not currently  use drugs.  ALLERGIES:  Allergies  Allergen Reactions   Other Itching, Rash, Shortness Of Breath and Swelling   Codeine Hives   Shellfish Allergy Hives   Statins Hives    Blisters and peeling of skin    ROS: Pertinent items noted in HPI and remainder of comprehensive ROS otherwise negative.  HOME MEDS: Current Outpatient Medications on File Prior to Visit  Medication Sig Dispense Refill   aspirin EC 81 MG tablet Take 81 mg by mouth daily.     benzonatate (TESSALON) 100 MG capsule Take 1 capsule (100 mg total) by mouth 2 (two) times daily as needed for cough. 20 capsule 0   cetirizine (ZYRTEC) 10 MG tablet Take 10 mg by mouth daily.     chlorthalidone (HYGROTON) 25 MG  tablet TAKE 1 TABLET (25 MG TOTAL) BY MOUTH DAILY. 90 tablet 0   clobetasol ointment (TEMOVATE) 0.05 % Apply topically 2 times daily as needed (Skin Rash). 30 g 1   Evolocumab (REPATHA SURECLICK) 140 MG/ML SOAJ Inject 140 mg into the skin every 14 (fourteen) days. 6 mL 3   ezetimibe (ZETIA) 10 MG tablet TAKE 1 TABLET BY MOUTH EVERY DAY 90 tablet 2   fluticasone (FLOVENT HFA) 110 MCG/ACT inhaler Inhale 2 puffs into the lungs daily as needed (Shortness of breath).     gabapentin (NEURONTIN) 300 MG capsule TAKE 1 CAPSULE BY MOUTH TWICE A DAY 180 capsule 0   glucose blood test strip Use as instructed 100 each 12   levothyroxine (SYNTHROID) 25 MCG tablet Take 2 tablets (50 mcg total) by mouth daily before breakfast. 180 tablet 0   losartan (COZAAR) 100 MG tablet TAKE 1 TABLET BY MOUTH EVERY DAY 90 tablet 2   metFORMIN (GLUCOPHAGE-XR) 500 MG 24 hr tablet TAKE 1 TAB BY MOUTH ONCE DAILY X1 WEEK, THEN INCREASE TO 1 TAB TWICE DAILY X1 WEEK, THEN 2 TABS IN THE AM AND 1 TAB IN THE PM X1 WEEK, AND FINALLY 2 TABS TWICE DAILY. TAKE WITH FOOD. 252 tablet 1   nystatin cream (MYCOSTATIN) Apply 1 application  topically daily as needed for dry skin.     triamcinolone cream (KENALOG) 0.1 % Apply 1 application  topically 2 (two) times daily as needed (Rash).     No current facility-administered medications on file prior to visit.    LABS/IMAGING: No results found for this or any previous visit (from the past 48 hour(s)). No results found.  LIPID PANEL:    Component Value Date/Time   CHOL 192 09/11/2021 0806   TRIG 160 (H) 09/11/2021 0806   HDL 57 09/11/2021 0806   CHOLHDL 3.4 09/11/2021 0806   CHOLHDL 4.4 10/27/2019 0903   LDLCALC 107 (H) 09/11/2021 0806   LDLCALC 156 (H) 10/27/2019 0903    WEIGHTS: Wt Readings from Last 3 Encounters:  08/08/22 179 lb 9.6 oz (81.5 kg)  05/12/22 178 lb 3.2 oz (80.8 kg)  04/03/22 184 lb 6.4 oz (83.6 kg)    VITALS: BP 124/66   Pulse 80   Ht 5\' 3"  (1.6 m)   Wt 179  lb 9.6 oz (81.5 kg)   SpO2 94%   BMI 31.81 kg/m   EXAM: Deferred  EKG: Deferred  ASSESSMENT: Mixed dyslipidemia, goal LDL<70 DM2 - A1c 5.9% 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   Sophia Robbins is doing well on Repatha. Her numbers are significantly improved with LDL now at target <70. TG's were a little elevated, which may have been due to  a spike in her HbA1c, but she reports that has improved. No med changes are recommended.   Follow-up annually or sooner as necessary.  Chrystie Nose, MD, [facility], FACP  [place]  [facility]  Medical Director of the [facility]  [facility] Diplomate of the American Board of Clinical Lipidology Attending Cardiologist  Direct Dial: [phone]  Fax: [phone]  Website:  [email or web address] Nicely Sophia Robbins 08/08/2022, 8:24 AM

## 2022-08-09 ENCOUNTER — Other Ambulatory Visit: Payer: Self-pay | Admitting: Internal Medicine

## 2022-08-09 DIAGNOSIS — E039 Hypothyroidism, unspecified: Secondary | ICD-10-CM

## 2022-08-12 DIAGNOSIS — M79672 Pain in left foot: Secondary | ICD-10-CM | POA: Diagnosis not present

## 2022-08-12 DIAGNOSIS — M722 Plantar fascial fibromatosis: Secondary | ICD-10-CM | POA: Diagnosis not present

## 2022-08-15 ENCOUNTER — Telehealth: Payer: Self-pay

## 2022-08-15 ENCOUNTER — Other Ambulatory Visit (HOSPITAL_COMMUNITY): Payer: Self-pay

## 2022-08-15 NOTE — Telephone Encounter (Addendum)
Pharmacy Patient Advocate Encounter  Received notification from [employer] [facility]) that Prior Authorization for REPATHA has been  APPROVED  FROM 7.26.24 TO 7.26.25  REFERENCE # IS PTS MEMBER ID# [id number]

## 2022-08-25 IMAGING — MG MM DIGITAL SCREENING BILAT W/ TOMO AND CAD
8 series · 8 of 24 positions shown · non-contrast
Comparison: Previous exam(s).

CLINICAL DATA: Screening.

EXAM:
DIGITAL SCREENING BILATERAL MAMMOGRAM WITH TOMOSYNTHESIS AND CAD
TECHNIQUE: Bilateral screening digital craniocaudal and mediolateral oblique
mammograms were obtained. Bilateral screening digital breast
tomosynthesis was performed. The images were evaluated with
computer-aided detection.

[L CC synth-2D]
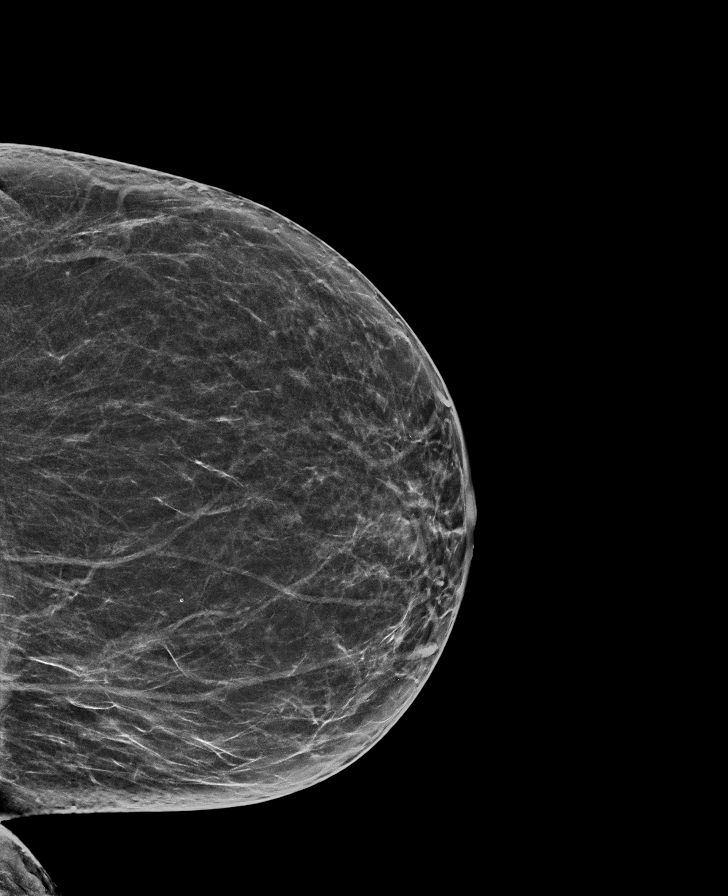

[L MLO synth-2D]
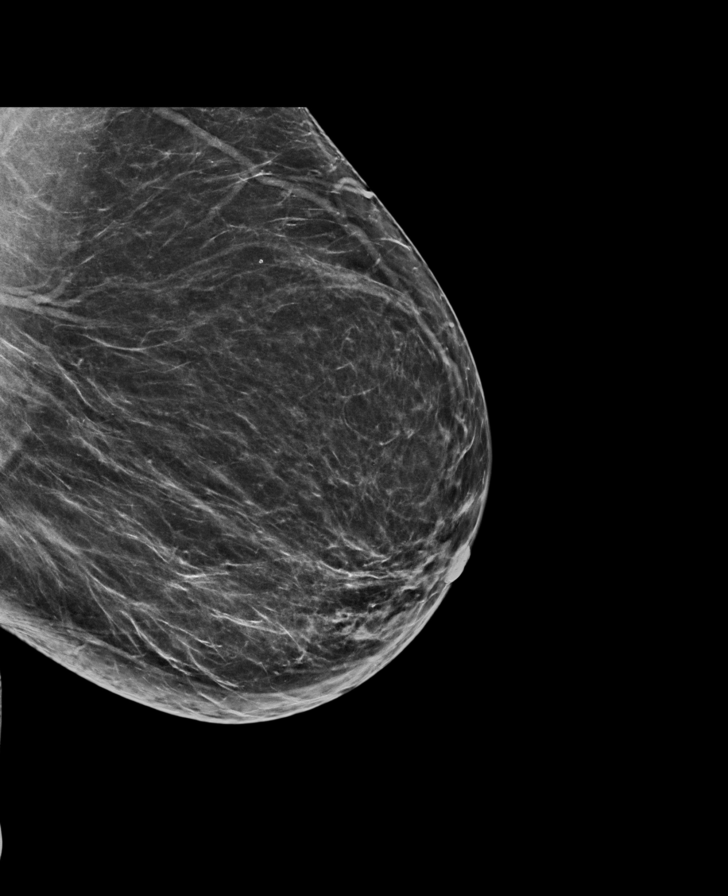

[R MLO synth-2D]
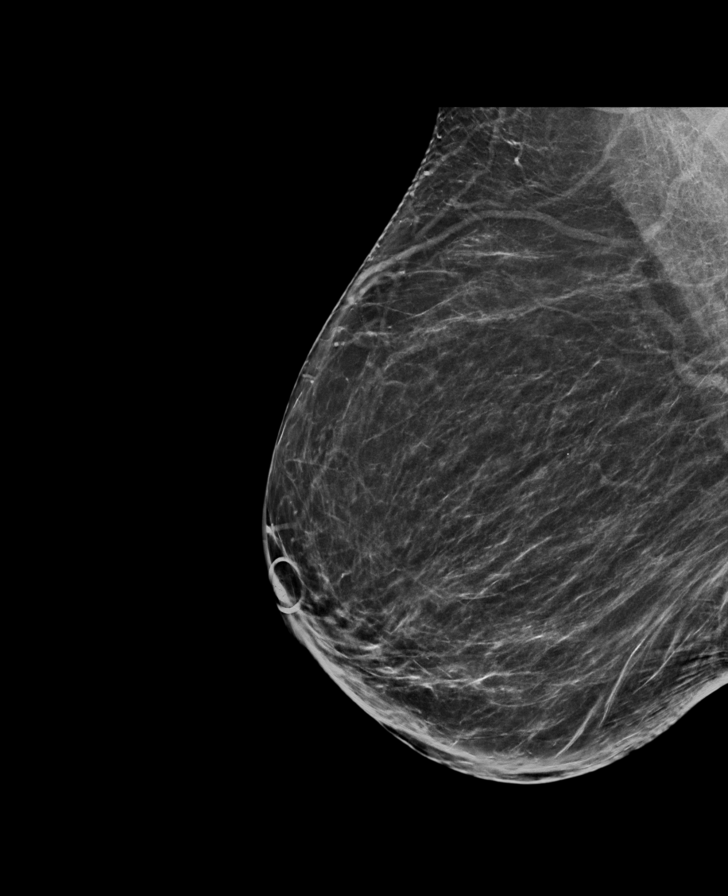

[R CC synth-2D]
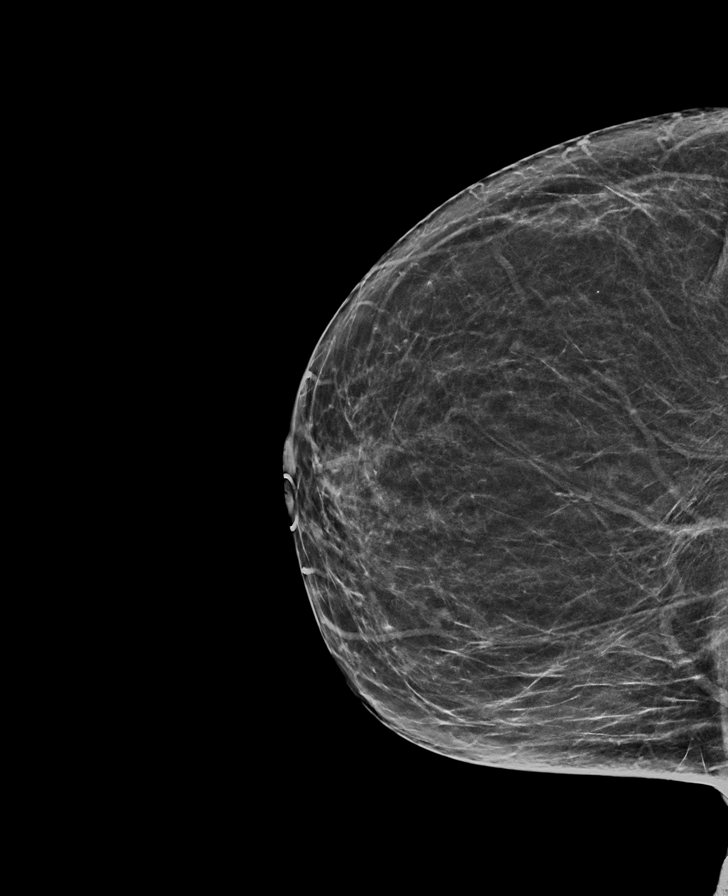

[R MLO tomo · tomo slice 36/71.0]
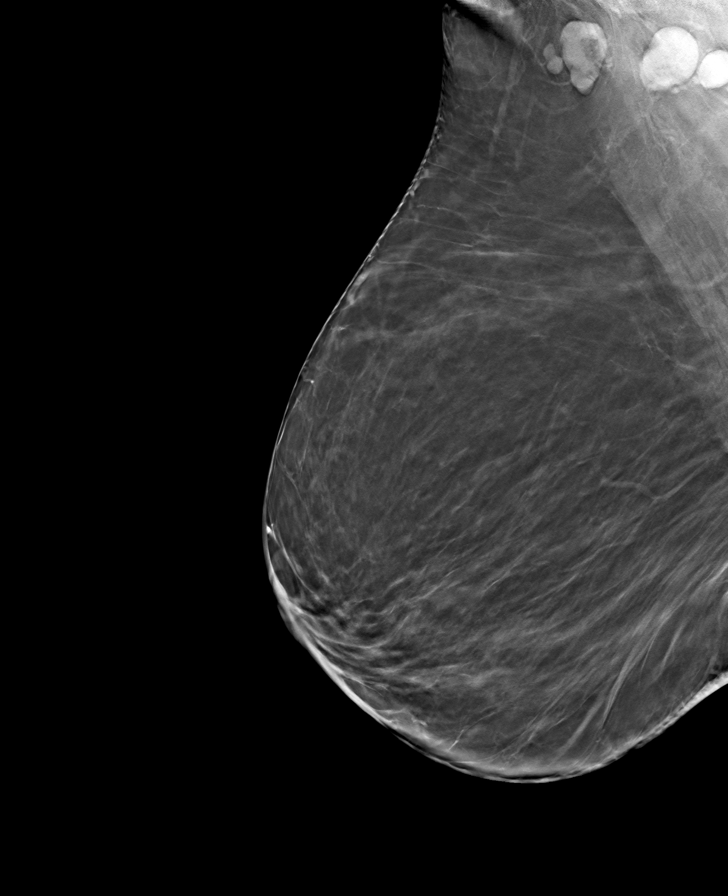

[L CC tomo · tomo slice 34/67.0]
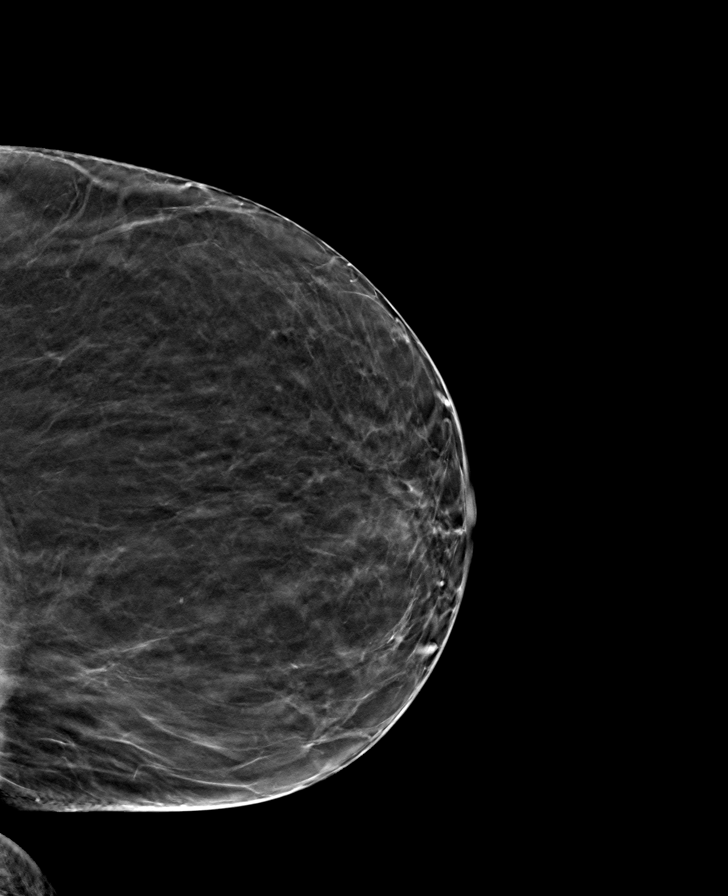

[L MLO tomo · tomo slice 37/73.0]
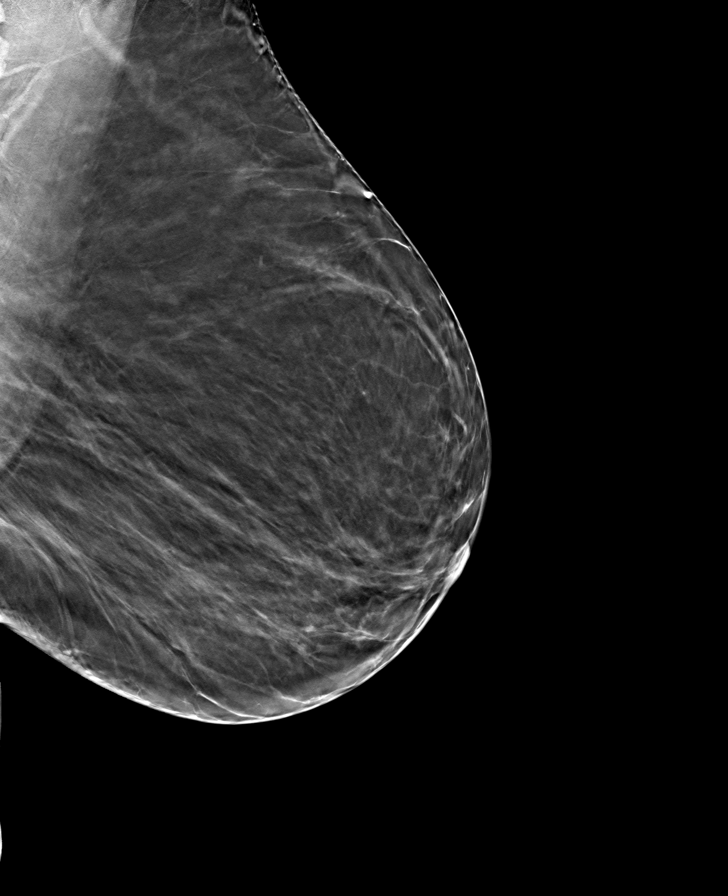

[R CC tomo · tomo slice 33/66.0]
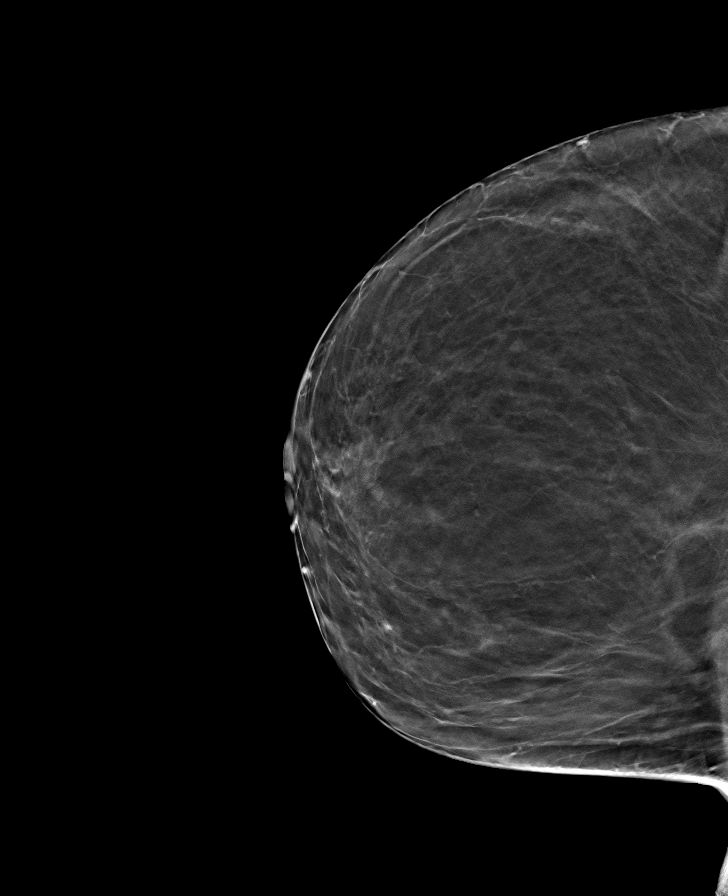

[8 of 24 positions shown; findings below may reference images not displayed]

ACR Breast Density Category b: There are scattered areas of
fibroglandular density.
FINDINGS: In the right axilla, a possible mass warrants further evaluation. In
the left breast, no findings suspicious for malignancy.
IMPRESSION: Further evaluation is suggested for possible mass in the right
axilla.

RECOMMENDATION:
Ultrasound of the right axilla. (Code:[id number])

The patient will be contacted regarding the findings, and additional
imaging will be scheduled.

BI-RADS CATEGORY  0: Incomplete. Need additional imaging evaluation
and/or prior mammograms for comparison.

## 2022-09-02 IMAGING — US US BREAST*R* LIMITED INC AXILLA
1 series · 13 of 20 positions shown · non-contrast
Comparison: Previous exams.

CLINICAL DATA: Screening recall for abnormal lymph nodes visualized
in the right axilla. Patient states she has been experiencing right
shoulder pain which may have been precipitated by use of crutches
several months prior. After reviewing the patient's chart, it is
been found that she did receive vaccinations in each upper extremity
05/03/2021 which was 3 days prior to her screening mammogram.

EXAM:
ULTRASOUND OF THE RIGHT BREAST

[Series 1: us breast*right* limited inc axilla · 0.07mm/px · 13 of 20 slices shown]
[im 1/20]
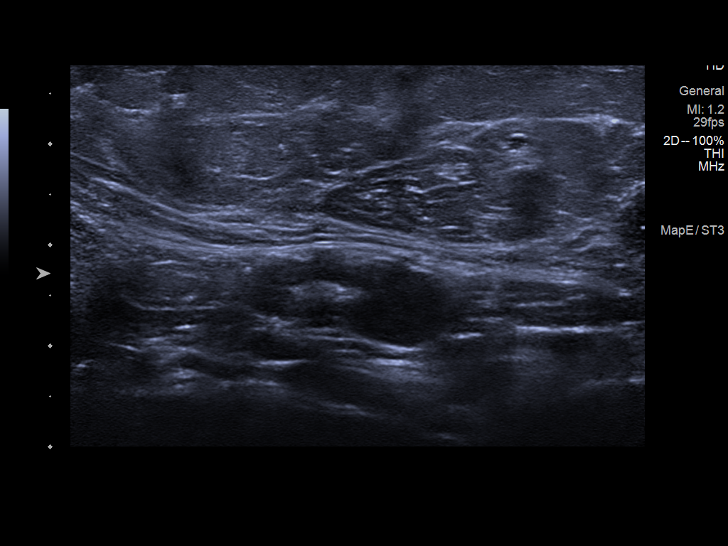
[im 3/20]
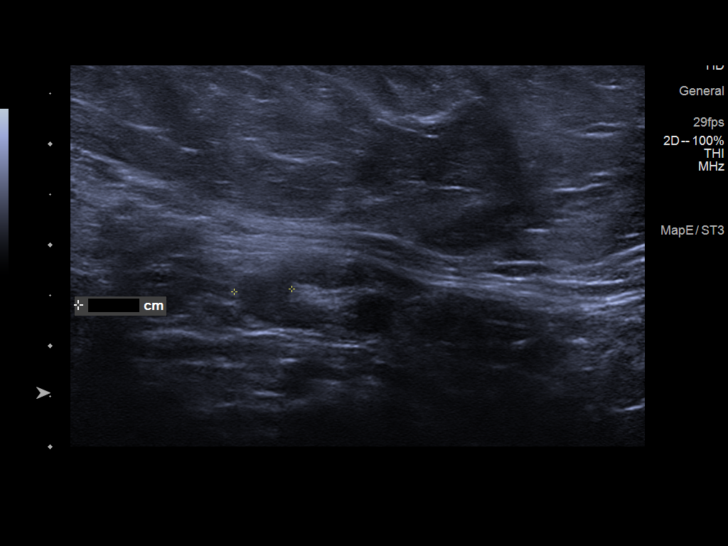
[im 4/20]
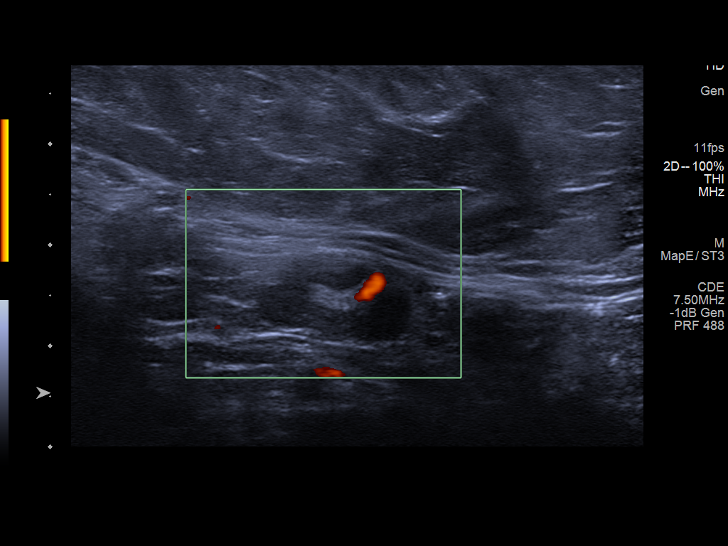
[im 6/20]
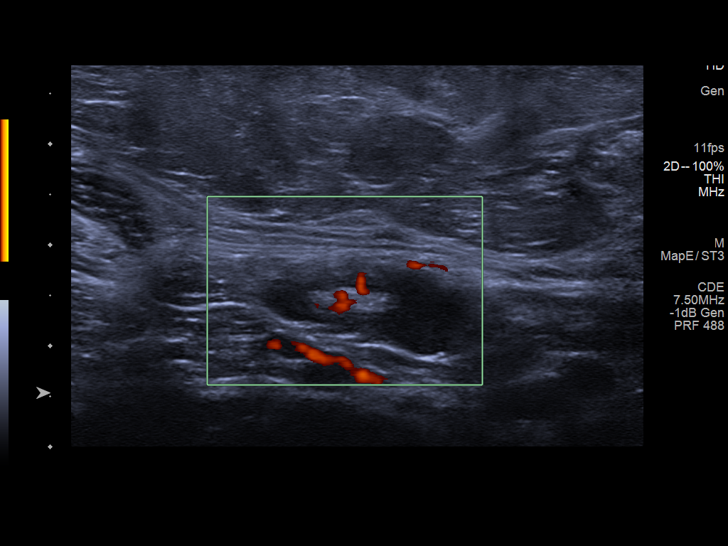
[im 7/20]
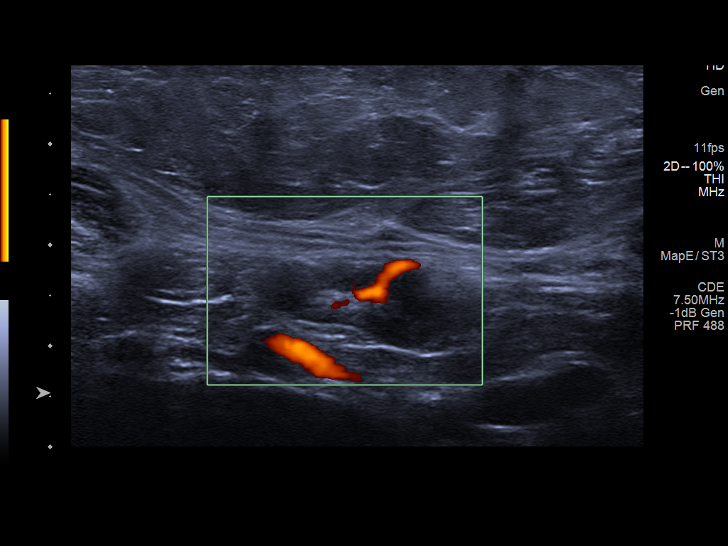
[im 9/20]
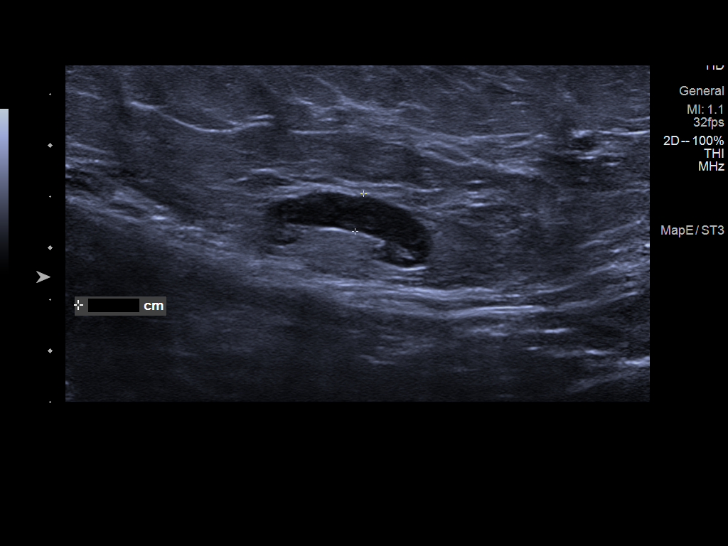
[im 11/20]
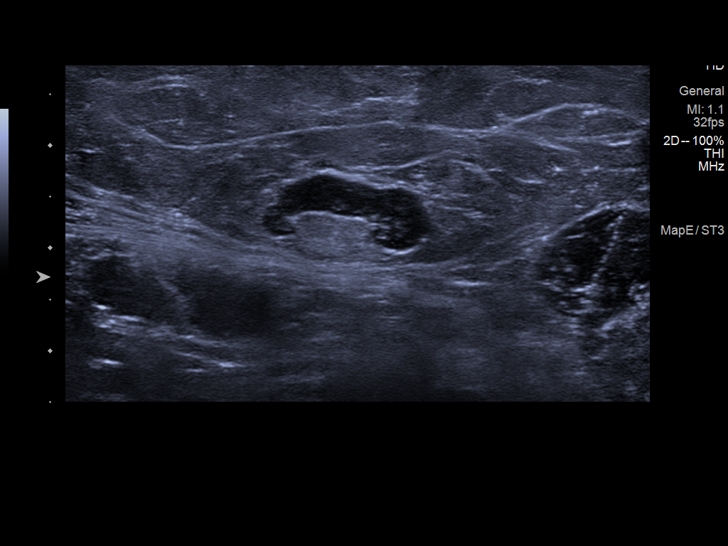
[im 12/20]
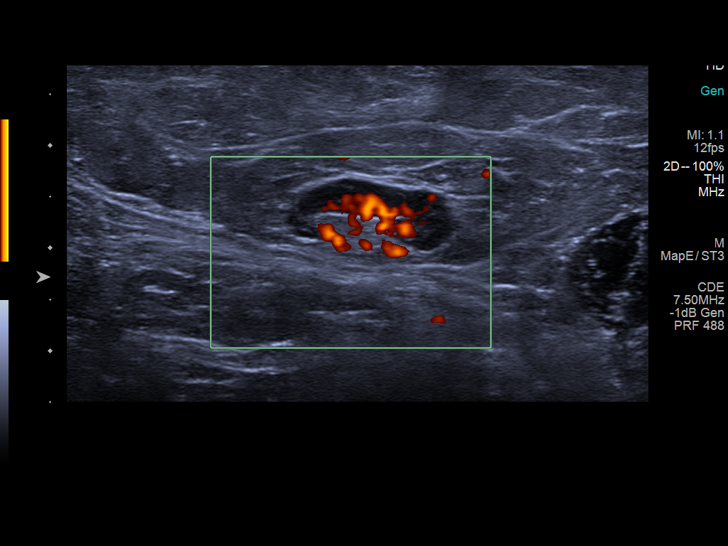
[im 14/20]
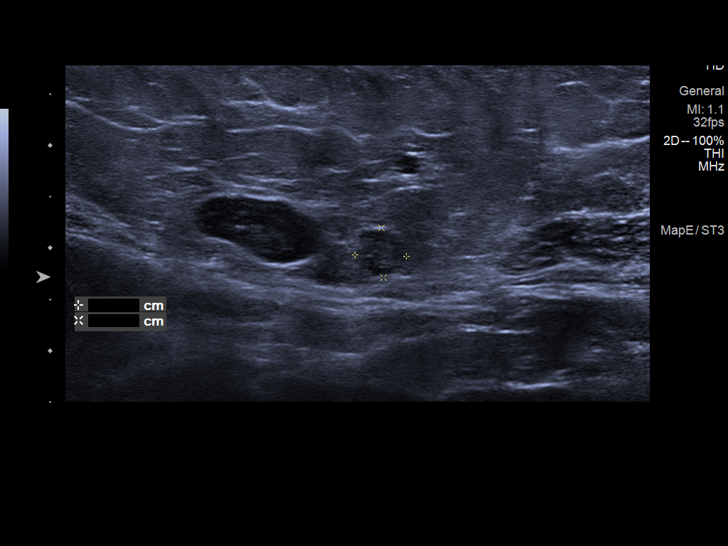
[im 15/20]
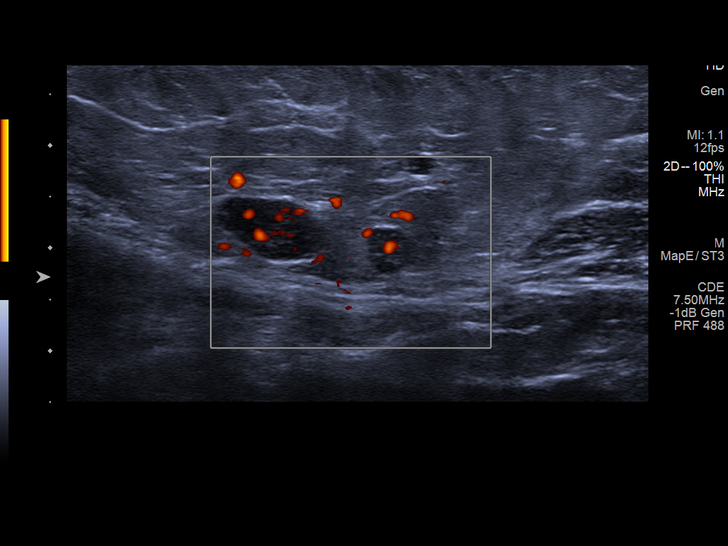
[im 17/20]
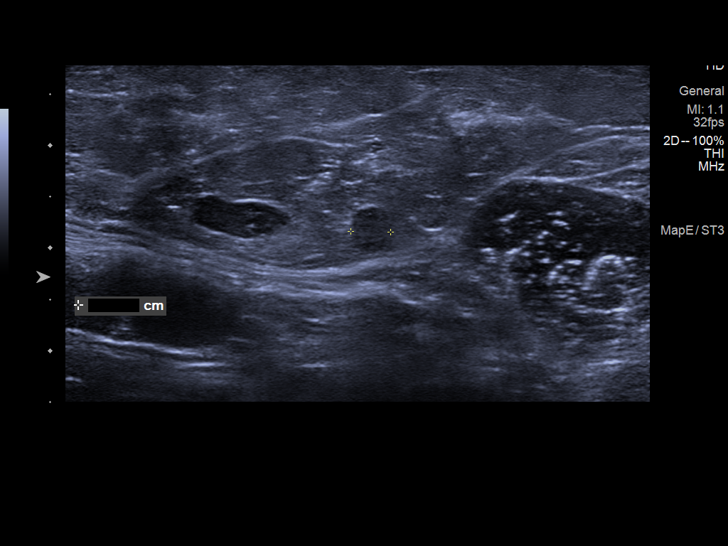
[im 18/20]
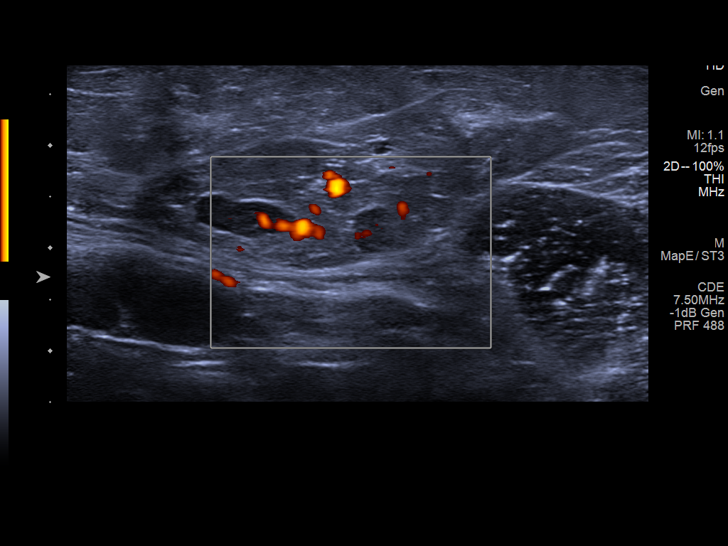
[im 20/20]
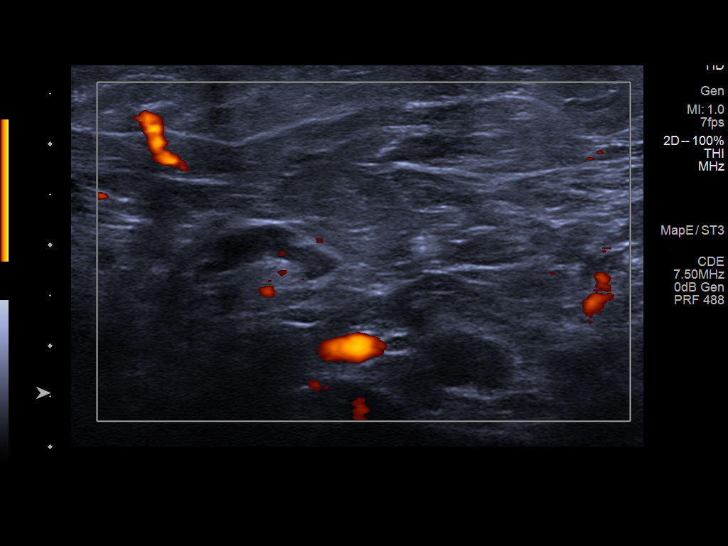

[13 of 20 positions shown; findings below may reference images not displayed]

FINDINGS: Targeted ultrasound of the right axilla was performed demonstrating
3 lymph nodes with mild to moderate cortical thickening. These lymph
nodes are felt to correspond well with the lymph nodes seen in the
right axilla at mammography. A brief scan of the left axilla
demonstrates lymph nodes of normal morphology.
IMPRESSION: Unilateral right axillary lymphadenopathy, possibly related to
vaccination 10 days prior.

RECOMMENDATION:
Recommend follow-up right axilla ultrasound in 4 weeks with possible
biopsy. If the lymph nodes in the right axilla have normalized when
the patient returns for her follow-up, then the biopsy can be
canceled.

I have discussed the findings and recommendations with the patient.
If applicable, a reminder letter will be sent to the patient
regarding the next appointment.

BI-RADS CATEGORY  3: Probably benign.

## 2022-09-21 ENCOUNTER — Other Ambulatory Visit: Payer: Self-pay | Admitting: Internal Medicine

## 2022-09-21 DIAGNOSIS — I10 Essential (primary) hypertension: Secondary | ICD-10-CM

## 2022-09-21 DIAGNOSIS — E785 Hyperlipidemia, unspecified: Secondary | ICD-10-CM

## 2022-09-21 DIAGNOSIS — E119 Type 2 diabetes mellitus without complications: Secondary | ICD-10-CM

## 2022-09-22 ENCOUNTER — Other Ambulatory Visit: Payer: Self-pay | Admitting: Internal Medicine

## 2022-09-22 DIAGNOSIS — I10 Essential (primary) hypertension: Secondary | ICD-10-CM

## 2022-09-22 DIAGNOSIS — E119 Type 2 diabetes mellitus without complications: Secondary | ICD-10-CM

## 2022-09-29 ENCOUNTER — Telehealth: Payer: Self-pay | Admitting: Adult Health

## 2022-09-29 NOTE — Telephone Encounter (Signed)
Lvm and sent mychart msg informing pt of need to reschedule 10/28/22 appt - NP out  If pt calls back to reschedule, you can offer a video visit slot on 10/11 with [facility]

## 2022-09-30 IMAGING — US US BREAST*R* LIMITED INC AXILLA
1 series · 7 of 7 positions shown · non-contrast
Comparison: Previous exams.

CLINICAL DATA: 61-year-old female presents for re-evaluation of
possible reactive right axillary lymph nodes with possible biopsy.
The patient did receive vaccinations in her right upper extremity
less than 2 weeks prior to her last diagnostic exam and given the
history of vaccination it was felt these lymph nodes may possibly be
reactive.

EXAM:
ULTRASOUND OF THE RIGHT BREAST

[Series 1: us breast*right* limited inc axilla · 0.10mm/px · 7 of 7 slices shown]
[im 1/7]
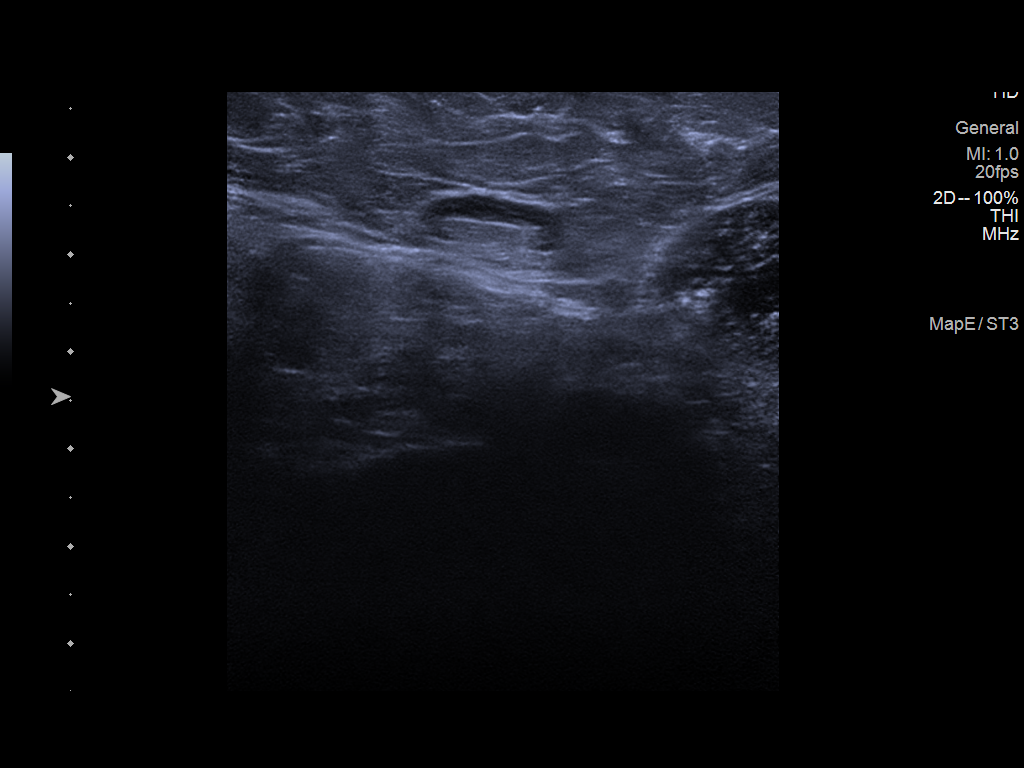
[im 2/7]
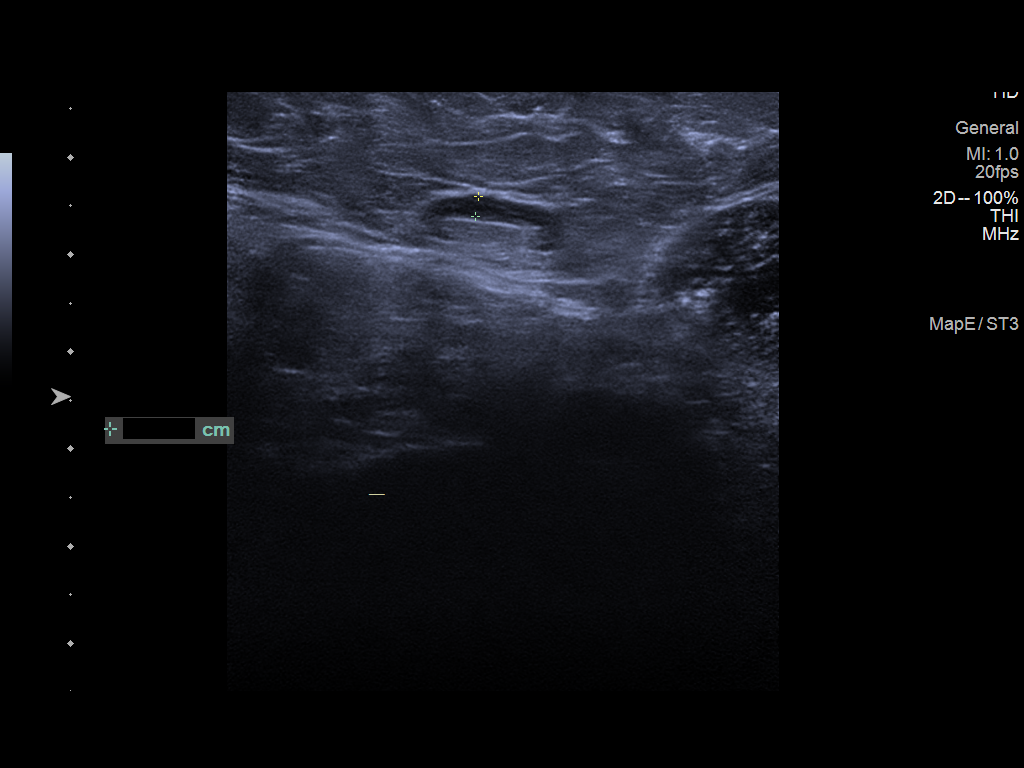
[im 3/7]
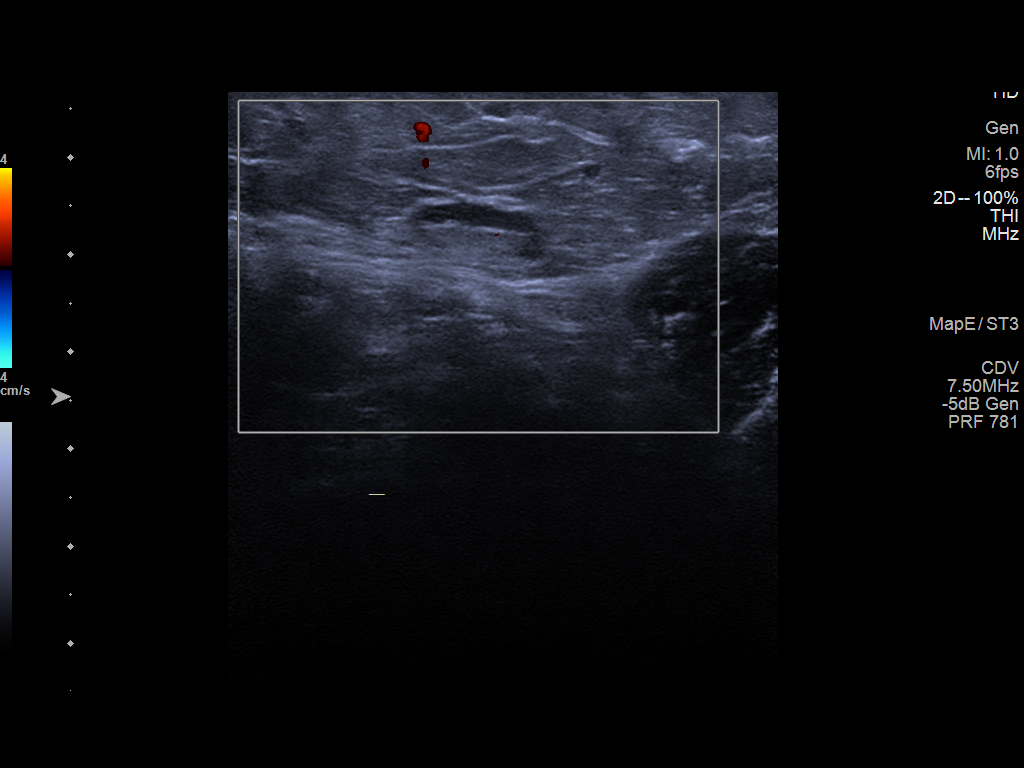
[im 4/7]
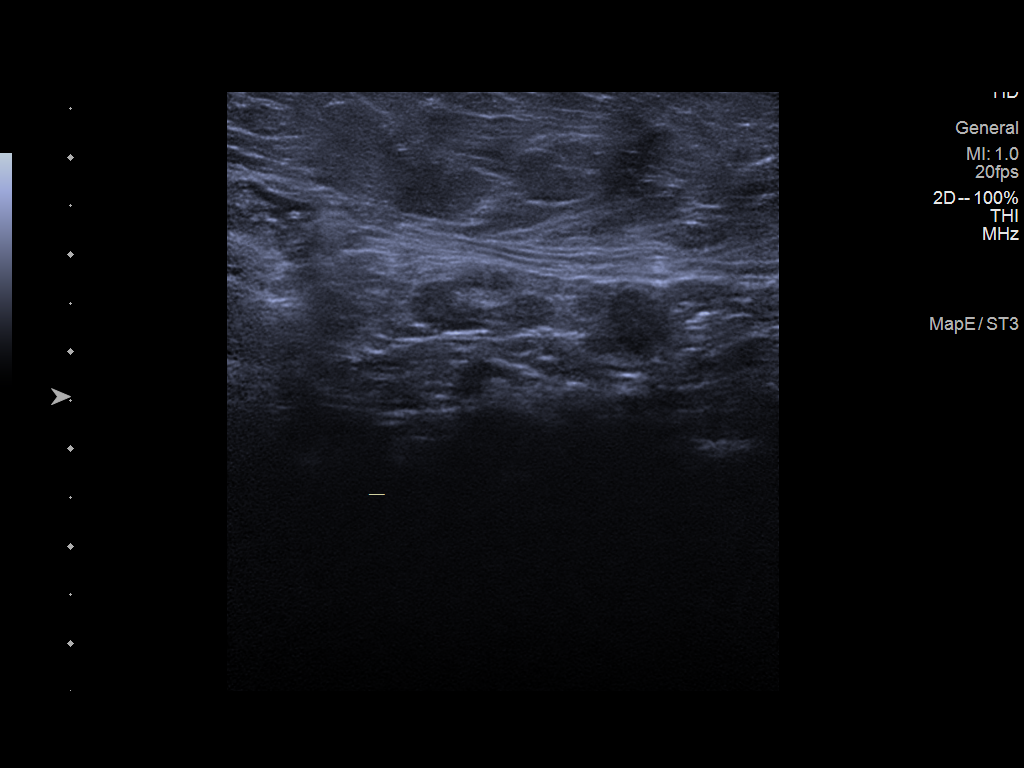
[im 5/7]
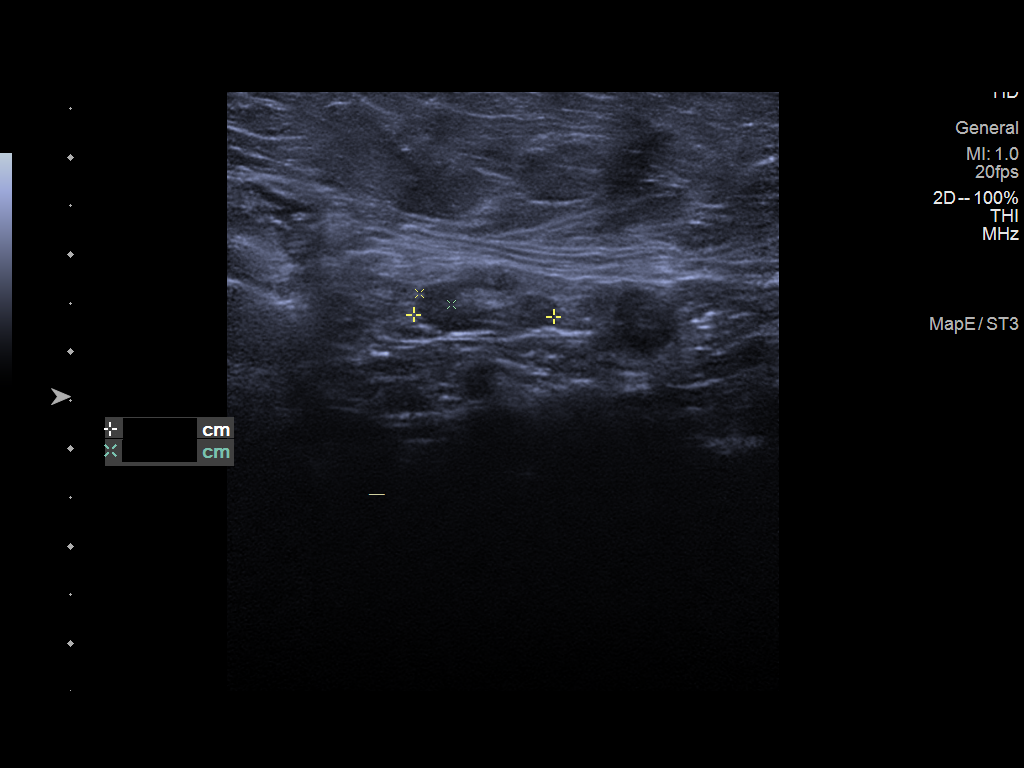
[im 6/7]
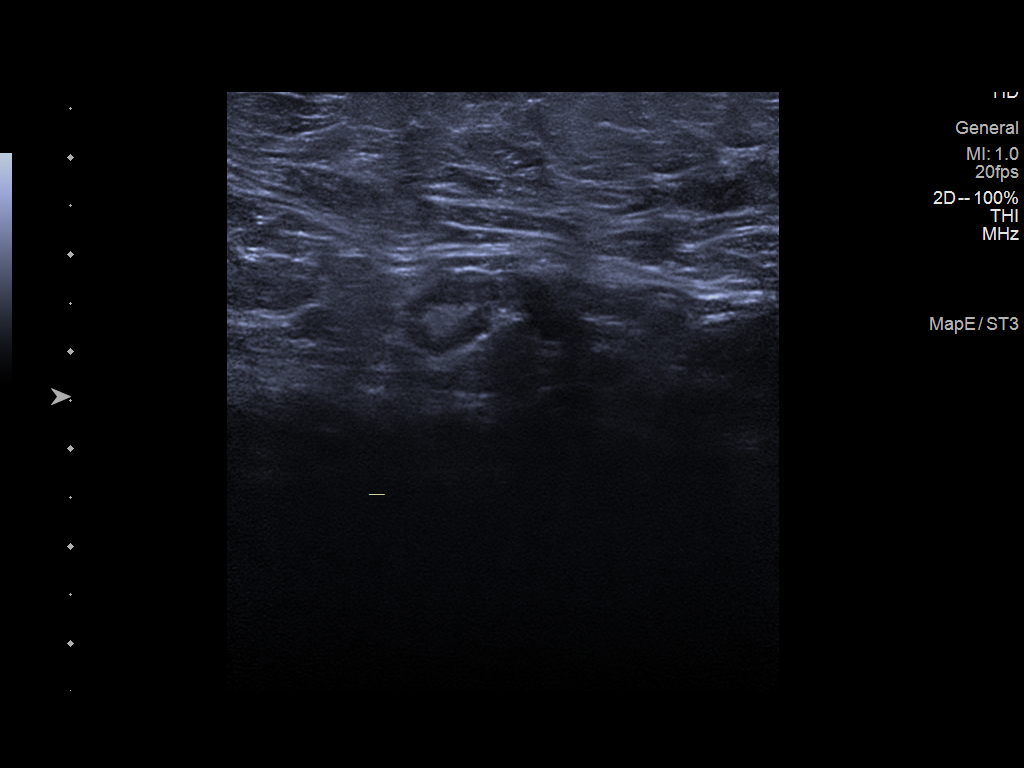
[im 7/7]
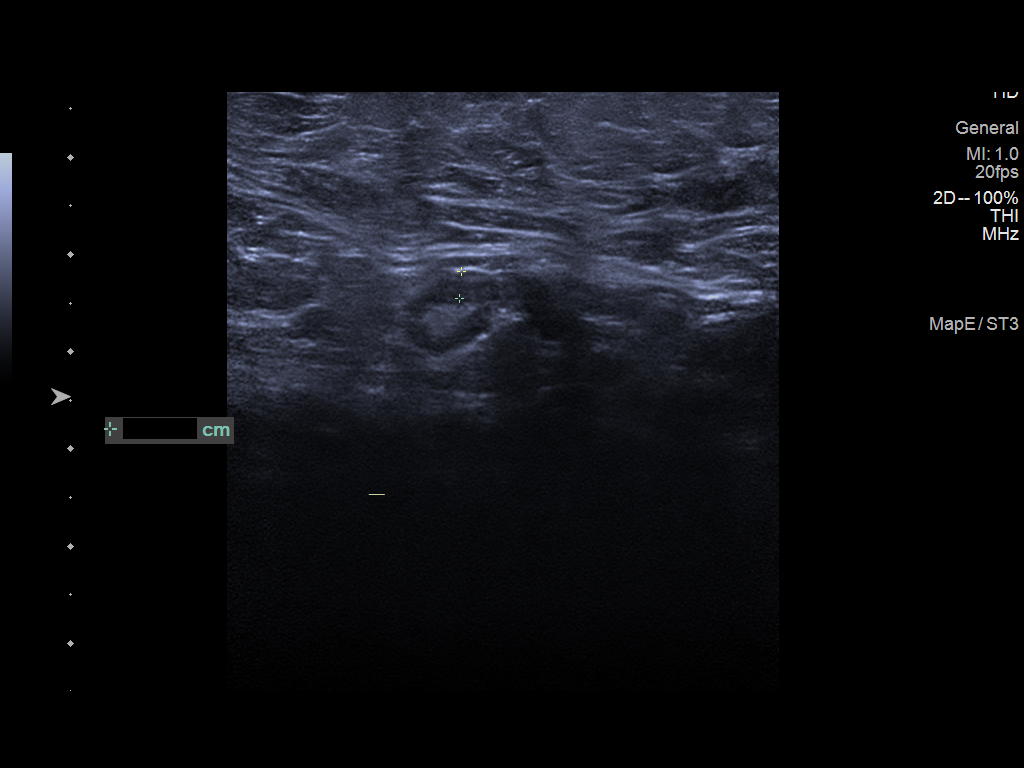

[7 of 7 positions shown; findings below may reference images not displayed]

FINDINGS: Targeted ultrasound of upper-outer right breast/right axilla was
performed. The right axillary lymph nodes have improved in
appearance when compared to the prior ultrasound, with a more
superficial lymph node in the right axilla now normal in appearance
with cortex thickness of 0.2 cm and near normalization of the
additional lymph nodes in the right axilla.
IMPRESSION: Marked improvement in appearance of the lymph nodes in the right
axilla which are felt to be reactive.

RECOMMENDATION:
Recommend six-month follow-up diagnostic mammography with possible
ultrasound of the right axilla. If the patient's lymph nodes are
normal when the patient returns for her follow-up, then return to
screening mammography would be recommended.

I have discussed the findings and recommendations with the patient.
If applicable, a reminder letter will be sent to the patient
regarding the next appointment.

BI-RADS CATEGORY  3: Probably benign.

## 2022-10-28 ENCOUNTER — Telehealth: Payer: Federal, State, Local not specified - PPO | Admitting: Adult Health

## 2022-10-30 ENCOUNTER — Encounter: Payer: Self-pay | Admitting: *Deleted

## 2022-10-30 ENCOUNTER — Telehealth: Payer: Self-pay | Admitting: Adult Health

## 2022-10-30 NOTE — Telephone Encounter (Signed)
LVM and sent mychart msg cancelling VV for 10/11, Sophia Robbins out of the office. I offered pt a VV today at 3:15 if she is available, asked for a call back.

## 2022-10-31 ENCOUNTER — Telehealth: Payer: Federal, State, Local not specified - PPO | Admitting: Adult Health

## 2022-10-31 NOTE — Progress Notes (Signed)
PATIENT: Sophia Robbins DOB: 1960/12/01  REASON FOR VISIT: follow up HISTORY FROM: patient PRIMARY NEUROLOGIST: Dr. Frances Furbish   Virtual Visit via Video Note  I connected with [name] on 10/31/22 at  9:30 AM EDT by a video enabled telemedicine application located remotely at [facility] and verified that I am speaking with the correct person using two identifiers who was located at their own home.   I discussed the limitations of evaluation and management by telemedicine and the availability of in person appointments. The patient expressed understanding and agreed to proceed.   PATIENT: Sophia Robbins DOB: November 12, 1960  REASON FOR VISIT: follow up HISTORY FROM: patient  HISTORY OF PRESENT ILLNESS: Today 10/31/22:  Zaydee Darin is a 62 y.o. female with a history of OSA on CPAP. Returns today for follow-up. Reports that CPAP is working well. Denies any new issues. DL is below.        10/29/21: Ms. Haviland is a 62 year old female with a history of obstructive sleep apnea on CPAP.  She returns today for follow-up.  Her download is below.  She states the CPAP is working well.  She has noticed more sleepiness but states that she started a new medication and she feels that this is the cause.  She returns today for an evaluation.   REVIEW OF SYSTEMS: Out of a complete 14 system review of symptoms, the patient complains only of the following symptoms, and all other reviewed systems are negative.  ALLERGIES: Allergies  Allergen Reactions   Other Itching, Rash, Shortness Of Breath and Swelling   Codeine Hives   Shellfish Allergy Hives   Statins Hives    Blisters and peeling of skin    HOME MEDICATIONS: Outpatient Medications Prior to Visit  Medication Sig Dispense Refill   aspirin EC 81 MG tablet Take 81 mg by mouth daily.     benzonatate (TESSALON) 100 MG capsule Take 1 capsule (100 mg total) by mouth 2 (two) times daily as needed for cough. 20 capsule 0    cetirizine (ZYRTEC) 10 MG tablet Take 10 mg by mouth daily.     chlorthalidone (HYGROTON) 25 MG tablet TAKE 1 TABLET (25 MG TOTAL) BY MOUTH DAILY. 90 tablet 0   clobetasol ointment (TEMOVATE) 0.05 % Apply topically 2 times daily as needed (Skin Rash). 30 g 1   Evolocumab (REPATHA SURECLICK) 140 MG/ML SOAJ Inject 140 mg into the skin every 14 (fourteen) days. 6 mL 3   ezetimibe (ZETIA) 10 MG tablet TAKE 1 TABLET BY MOUTH EVERY DAY 90 tablet 2   fluticasone (FLOVENT HFA) 110 MCG/ACT inhaler Inhale 2 puffs into the lungs daily as needed (Shortness of breath).     gabapentin (NEURONTIN) 300 MG capsule TAKE 1 CAPSULE BY MOUTH TWICE A DAY 180 capsule 0   glucose blood test strip Use as instructed 100 each 12   levothyroxine (SYNTHROID) 25 MCG tablet TAKE 2 TABLETS (50 MCG TOTAL) BY MOUTH DAILY BEFORE BREAKFAST. 180 tablet 0   losartan (COZAAR) 100 MG tablet TAKE 1 TABLET BY MOUTH EVERY DAY 90 tablet 2   metFORMIN (GLUCOPHAGE-XR) 500 MG 24 hr tablet TAKE 1 TAB BY MOUTH ONCE DAILY X1 WEEK, THEN INCREASE TO 1 TAB TWICE DAILY X1 WEEK, THEN 2 TABS IN THE AM AND 1 TAB IN THE PM X1 WEEK, AND FINALLY 2 TABS TWICE DAILY. TAKE WITH FOOD. 252 tablet 1   nystatin cream (MYCOSTATIN) Apply 1 application  topically daily as needed for dry skin.  pantoprazole (PROTONIX) 40 MG tablet TAKE 1 TABLET (40 MG TOTAL) BY MOUTH DAILY AS NEEDED (INDIGESTION). 90 tablet 0   triamcinolone cream (KENALOG) 0.1 % Apply 1 application  topically 2 (two) times daily as needed (Rash).     No facility-administered medications prior to visit.    PAST MEDICAL HISTORY: Past Medical History:  Diagnosis Date   Allergy    Asthma    Diabetes mellitus without complication (HCC)    Hyperlipidemia    OSA on CPAP     PAST SURGICAL HISTORY: Past Surgical History:  Procedure Laterality Date   APPENDECTOMY     BIOPSY  06/26/2020   Procedure: BIOPSY;  Surgeon: Dolores Frame, MD;  [facility];  Service:  Gastroenterology;;   CESAREAN SECTION     COLONOSCOPY WITH PROPOFOL N/A 06/26/2020   Procedure: COLONOSCOPY WITH PROPOFOL;  Surgeon: Dolores Frame, MD;  [facility];  Service: Gastroenterology;  Laterality: N/A;  9:00   ESOPHAGOGASTRODUODENOSCOPY (EGD) WITH PROPOFOL N/A 06/26/2020   Procedure: ESOPHAGOGASTRODUODENOSCOPY (EGD) WITH PROPOFOL;  Surgeon: Dolores Frame, MD;  [facility];  Service: Gastroenterology;  Laterality: N/A;   EXPLORATION POST OPERATIVE OPEN HEART     KNEE ARTHROSCOPY AND ARTHROTOMY Left    TUBAL LIGATION      FAMILY HISTORY: Family History  Problem Relation Age of Onset   Cancer Mother    Lung cancer Mother    Cancer Father    Esophageal cancer Father    Diabetes Sister    Sleep apnea Sister    COPD Brother    Sleep apnea Brother    Stomach cancer Brother    Cancer - Ovarian Paternal Grandmother     SOCIAL HISTORY: Social History   Socioeconomic History   Marital status: Married    Spouse name: Not on file   Number of children: 1   Years of education: Not on file   Highest education level: Some college, no degree  Occupational History   Occupation: Public relations account executive    Comment: [employer]  Tobacco Use   Smoking status: Former    Current packs/day: 0.00    Types: Cigarettes    Quit date: 06/20/2012    Years since quitting: 10.3   Smokeless tobacco: Never  Vaping Use   Vaping status: Never Used  Substance and Sexual Activity   Alcohol use: Yes    Alcohol/week: 3.0 standard drinks of alcohol    Types: 3 Glasses of wine per week    Comment: occasional   Drug use: Not Currently   Sexual activity: Yes    Birth control/protection: Surgical    Comment: tubal  Other Topics Concern   Not on file  Social History Narrative   Lives with husband of 39 years    1 daughter -granddaughter and grandson      Two cats-outside      Enjoys: crafts, Web designer, redoing home       Diet: eat all food groups, avoids milk  -little intolerance   Caffeine: 1 cup coffee, unsweet iced tea   Water: 6 cups daily       Wears seat belt   Smoke detectors at home    Does not use phone while driving    Social Determinants of Health   Financial Resource Strain: Low Risk  (05/11/2022)   Overall Financial Resource Strain (CARDIA)    Difficulty of Paying Living Expenses: Not hard at all  Food Insecurity: No Food Insecurity (05/11/2022)  Hunger Vital Sign    Worried About Running Out of Food in the Last Year: Never true    Ran Out of Food in the Last Year: Never true  Transportation Needs: No Transportation Needs (05/11/2022)   PRAPARE - Administrator, Civil Service (Medical): No    Lack of Transportation (Non-Medical): No  Physical Activity: Insufficiently Active (05/11/2022)   Exercise Vital Sign    Days of Exercise per Week: 3 days    Minutes of Exercise per Session: 20 min  Stress: No Stress Concern Present (05/11/2022)   [employer] of Occupational Health - Occupational Stress Questionnaire    Feeling of Stress : Not at all  Social Connections: Moderately Isolated (05/11/2022)   Social Connection and Isolation Panel [NHANES]    Frequency of Communication with Friends and Family: More than three times a week    Frequency of Social Gatherings with Friends and Family: Once a week    Attends Religious Services: Never    Database administrator or Organizations: No    Attends Engineer, structural: Not on file    Marital Status: Married  Catering manager Violence: Not on file      PHYSICAL EXAM Generalized: Well developed, in no acute distress   Neurological examination  Mentation: Alert oriented to time, place, history taking. Follows all commands speech and language fluent Cranial nerve II-XII: Facial symmetry noted.  DIAGNOSTIC DATA (LABS, IMAGING, TESTING) - I reviewed patient records, labs, notes, testing and imaging myself where available.  Lab Results  Component Value Date    WBC 5.6 05/12/2022   HGB 14.9 05/12/2022   HCT 43.9 05/12/2022   MCV 91 05/12/2022   PLT 248 05/12/2022      Component Value Date/Time   NA 139 05/12/2022 0835   K 4.1 05/12/2022 0835   CL 97 05/12/2022 0835   CO2 24 05/12/2022 0835   GLUCOSE 122 (H) 05/12/2022 0835   GLUCOSE 104 (H) 11/10/2019 0818   BUN 32 (H) 05/12/2022 0835   CREATININE 1.09 (H) 05/12/2022 0835   CREATININE 0.91 11/10/2019 0818   CALCIUM 10.4 (H) 05/12/2022 0835   PROT 7.2 05/12/2022 0835   ALBUMIN 5.0 (H) 05/12/2022 0835   AST 51 (H) 05/12/2022 0835   ALT 61 (H) 05/12/2022 0835   ALKPHOS 88 05/12/2022 0835   BILITOT 0.3 05/12/2022 0835   GFRNONAA 79 01/30/2020 1622   GFRNONAA 69 11/10/2019 0818   GFRAA 91 01/30/2020 1622   GFRAA 79 11/10/2019 0818   Lab Results  Component Value Date   CHOL 192 09/11/2021   HDL 57 09/11/2021   LDLCALC 107 (H) 09/11/2021   TRIG 160 (H) 09/11/2021   CHOLHDL 3.4 09/11/2021   Lab Results  Component Value Date   HGBA1C 8.4 (A) 04/03/2022   Lab Results  Component Value Date   VITAMINB12 484 05/12/2022   Lab Results  Component Value Date   TSH 4.430 05/12/2022      ASSESSMENT AND PLAN 62 y.o. year old female  has a past medical history of Allergy, Asthma, Diabetes mellitus without complication (HCC), Hyperlipidemia, and OSA on CPAP. here with:  OSA on CPAP  CPAP compliance excellent Residual AHI is good Encouraged patient to continue using CPAP nightly and > 4 hours each night F/U in 1 year or sooner if needed  Butch Penny, MSN, NP-C 10/31/2022, 10:53 AM [facility] [address], [address] [place], [place] [address] [phone]

## 2022-11-03 ENCOUNTER — Telehealth (INDEPENDENT_AMBULATORY_CARE_PROVIDER_SITE_OTHER): Payer: Federal, State, Local not specified - PPO | Admitting: Adult Health

## 2022-11-03 DIAGNOSIS — G4733 Obstructive sleep apnea (adult) (pediatric): Secondary | ICD-10-CM

## 2022-11-03 NOTE — Patient Instructions (Signed)
Continue using CPAP nightly and greater than 4 hours each night °If your symptoms worsen or you develop new symptoms please let us know.  ° °

## 2022-11-11 ENCOUNTER — Ambulatory Visit: Payer: Federal, State, Local not specified - PPO | Admitting: Internal Medicine

## 2022-11-11 ENCOUNTER — Encounter: Payer: Self-pay | Admitting: Internal Medicine

## 2022-11-11 VITALS — BP 144/68 | HR 61 | Ht 63.0 in | Wt 182.0 lb

## 2022-11-11 DIAGNOSIS — E038 Other specified hypothyroidism: Secondary | ICD-10-CM

## 2022-11-11 DIAGNOSIS — E118 Type 2 diabetes mellitus with unspecified complications: Secondary | ICD-10-CM | POA: Diagnosis not present

## 2022-11-11 DIAGNOSIS — I10 Essential (primary) hypertension: Secondary | ICD-10-CM | POA: Diagnosis not present

## 2022-11-11 DIAGNOSIS — E039 Hypothyroidism, unspecified: Secondary | ICD-10-CM

## 2022-11-11 DIAGNOSIS — Z7984 Long term (current) use of oral hypoglycemic drugs: Secondary | ICD-10-CM

## 2022-11-11 NOTE — Assessment & Plan Note (Signed)
Her acute concern today is reviewing indications for levothyroxine.  She is currently prescribed levothyroxine 50 mcg daily.  It appears that this was started in March 2021 when her TSH was mildly elevated at 4.52 and 5.9.  She was asymptomatic at that time. -Labs from March 2021 were reviewed.  Consistent with subclinical hypothyroidism.  No definite indication for thyroid hormone replacement therapy.  Patient would like to trial off levothyroxine to see if it is truly needed.  I recommended reducing her current dose of levothyroxine to 25 mcg daily.  Repeat TSH with reflex T4 ordered today.  She will return for repeat thyroid studies in 2 months.

## 2022-11-11 NOTE — Assessment & Plan Note (Signed)
A1c 8.4 on labs from March.  She has resumed metformin and is currently taking 1000 mg twice daily. -Repeat A1c ordered today

## 2022-11-11 NOTE — Patient Instructions (Signed)
It was a pleasure to see you today.  Thank you for giving [place] the opportunity to be involved in your care.  Below is a brief recap of your visit and next steps.  We will plan to see you again in 6 months.  Summary Reduce levothyroxine to 25 mcg daily until running out Repeat labs ordered Follow up with me in 6 months Nurse visit in 2 month for repeat thyroid studies and BP check

## 2022-11-11 NOTE — Assessment & Plan Note (Signed)
BP is mildly elevated today.  Her current antihypertensive regimen consists of chlorthalidone 25 mg daily and losartan 100 mg daily.  She has previously been well-controlled on this regimen.  Reports that she took her medication just before her appointment today. -No medication changes today.  Nurse visit in 2 months for BP check.

## 2022-11-11 NOTE — Progress Notes (Signed)
Established Patient Office Visit  Subjective   Patient ID: Sophia Robbins, female    DOB: 07/12/1960  Age: 62 y.o. MRN: [id number]  Chief Complaint  Patient presents with   Hypertension    Six month follow up    Sophia Robbins returns to care today for routine follow-up.  She was last evaluated by me on 4/22 for HTN follow-up.  Protonix 40 mg was added for as needed symptom relief in the setting of dyspepsia.  No additional medication changes were made and 49-month follow-up was arranged.  In the interim, she has been evaluated by cardiology and neurology for follow-up.  There have otherwise been no acute interval events.  Sophia Robbins reports feeling well today.  She is asymptomatic currently.  She would like to review indications for her to remain on levothyroxine.  She also expresses frustration over the current cost of Repatha and plans to discuss this with cardiology.  Past Medical History:  Diagnosis Date   Allergy    Asthma    Diabetes mellitus without complication (HCC)    Hyperlipidemia    OSA on CPAP    Past Surgical History:  Procedure Laterality Date   APPENDECTOMY     BIOPSY  06/26/2020   Procedure: BIOPSY;  Surgeon: Dolores Frame, MD;  [facility];  Service: Gastroenterology;;   CESAREAN SECTION     COLONOSCOPY WITH PROPOFOL N/A 06/26/2020   Procedure: COLONOSCOPY WITH PROPOFOL;  Surgeon: Dolores Frame, MD;  [facility];  Service: Gastroenterology;  Laterality: N/A;  9:00   ESOPHAGOGASTRODUODENOSCOPY (EGD) WITH PROPOFOL N/A 06/26/2020   Procedure: ESOPHAGOGASTRODUODENOSCOPY (EGD) WITH PROPOFOL;  Surgeon: Dolores Frame, MD;  [facility];  Service: Gastroenterology;  Laterality: N/A;   EXPLORATION POST OPERATIVE OPEN HEART     KNEE ARTHROSCOPY AND ARTHROTOMY Left    TUBAL LIGATION     Social History   Tobacco Use   Smoking status: Former    Current packs/day: 0.00    Types: Cigarettes    Quit date:  06/20/2012    Years since quitting: 10.4   Smokeless tobacco: Never  Vaping Use   Vaping status: Never Used  Substance Use Topics   Alcohol use: Yes    Alcohol/week: 3.0 standard drinks of alcohol    Types: 3 Glasses of wine per week    Comment: occasional   Drug use: Not Currently   Family History  Problem Relation Age of Onset   Cancer Mother    Lung cancer Mother    Cancer Father    Esophageal cancer Father    Diabetes Sister    Sleep apnea Sister    COPD Brother    Sleep apnea Brother    Stomach cancer Brother    Cancer - Ovarian Paternal Grandmother    Allergies  Allergen Reactions   Other Itching, Rash, Shortness Of Breath and Swelling   Codeine Hives   Shellfish Allergy Hives   Statins Hives    Blisters and peeling of skin   Review of Systems  Constitutional:  Negative for chills and fever.  HENT:  Negative for sore throat.   Respiratory:  Negative for cough and shortness of breath.   Cardiovascular:  Negative for chest pain, palpitations and leg swelling.  Gastrointestinal:  Negative for abdominal pain, blood in stool, constipation, diarrhea, nausea and vomiting.  Genitourinary:  Negative for dysuria and hematuria.  Musculoskeletal:  Negative for myalgias.  Skin:  Negative for itching and rash.  Neurological:  Negative for dizziness and headaches.  Psychiatric/Behavioral:  Negative for depression and suicidal ideas.      Objective:     BP (!) 144/68   Pulse 61   Ht 5\' 3"  (1.6 m)   Wt 182 lb (82.6 kg)   SpO2 98%   BMI 32.24 kg/m  BP Readings from Last 3 Encounters:  11/11/22 (!) 144/68  08/08/22 124/66  05/12/22 124/72   Physical Exam Constitutional:      General: She is not in acute distress.    Appearance: Normal appearance. She is normal weight. She is not toxic-appearing.  HENT:     Head: Normocephalic and atraumatic.  Cardiovascular:     Rate and Rhythm: Normal rate and regular rhythm.     Pulses: Normal pulses.     Heart sounds: Murmur  heard.  Pulmonary:     Effort: Pulmonary effort is normal.     Breath sounds: Normal breath sounds. No wheezing, rhonchi or rales.  Abdominal:     General: Abdomen is flat. Bowel sounds are normal. There is no distension.     Palpations: Abdomen is soft.     Tenderness: There is no abdominal tenderness.  Musculoskeletal:        General: No swelling.     Right lower leg: No edema.     Left lower leg: No edema.  Skin:    General: Skin is warm and dry.     Coloration: Skin is not jaundiced.  Neurological:     Mental Status: She is alert.   Last CBC Lab Results  Component Value Date   WBC 5.6 05/12/2022   HGB 14.9 05/12/2022   HCT 43.9 05/12/2022   MCV 91 05/12/2022   MCH 30.9 05/12/2022   RDW 12.0 05/12/2022   PLT 248 05/12/2022   Last metabolic panel Lab Results  Component Value Date   GLUCOSE 122 (H) 05/12/2022   NA 139 05/12/2022   K 4.1 05/12/2022   CL 97 05/12/2022   CO2 24 05/12/2022   BUN 32 (H) 05/12/2022   CREATININE 1.09 (H) 05/12/2022   EGFR 57 (L) 05/12/2022   CALCIUM 10.4 (H) 05/12/2022   PROT 7.2 05/12/2022   ALBUMIN 5.0 (H) 05/12/2022   LABGLOB 2.2 05/12/2022   AGRATIO 2.3 (H) 05/12/2022   BILITOT 0.3 05/12/2022   ALKPHOS 88 05/12/2022   AST 51 (H) 05/12/2022   ALT 61 (H) 05/12/2022   Last lipids Lab Results  Component Value Date   CHOL 192 09/11/2021   HDL 57 09/11/2021   LDLCALC 107 (H) 09/11/2021   TRIG 160 (H) 09/11/2021   CHOLHDL 3.4 09/11/2021   Last hemoglobin A1c Lab Results  Component Value Date   HGBA1C 8.4 (A) 04/03/2022   Last thyroid functions Lab Results  Component Value Date   TSH 4.430 05/12/2022   Last vitamin D Lab Results  Component Value Date   VD25OH 24.2 (L) 05/12/2022   Last vitamin B12 and Folate Lab Results  Component Value Date   VITAMINB12 484 05/12/2022   FOLATE >20.0 05/12/2022     Assessment & Plan:   Problem List Items Addressed This Visit       Essential hypertension - Primary    BP is  mildly elevated today.  Her current antihypertensive regimen consists of chlorthalidone 25 mg daily and losartan 100 mg daily.  She has previously been well-controlled on this regimen.  Reports that she took her medication just before her appointment today. -No medication changes  today.  Nurse visit in 2 months for BP check.      Controlled diabetes mellitus type 2 with complications (HCC)    A1c 8.4 on labs from March.  She has resumed metformin and is currently taking 1000 mg twice daily. -Repeat A1c ordered today      Subclinical hypothyroidism    Her acute concern today is reviewing indications for levothyroxine.  She is currently prescribed levothyroxine 50 mcg daily.  It appears that this was started in March 2021 when her TSH was mildly elevated at 4.52 and 5.9.  She was asymptomatic at that time. -Labs from March 2021 were reviewed.  Consistent with subclinical hypothyroidism.  No definite indication for thyroid hormone replacement therapy.  Patient would like to trial off levothyroxine to see if it is truly needed.  I recommended reducing her current dose of levothyroxine to 25 mcg daily.  Repeat TSH with reflex T4 ordered today.  She will return for repeat thyroid studies in 2 months.       Return in about 6 months (around 05/12/2023).    Billie Lade, MD

## 2022-11-12 ENCOUNTER — Other Ambulatory Visit: Payer: Self-pay | Admitting: Internal Medicine

## 2022-11-12 DIAGNOSIS — N1831 Chronic kidney disease, stage 3a: Secondary | ICD-10-CM

## 2022-11-12 DIAGNOSIS — I10 Essential (primary) hypertension: Secondary | ICD-10-CM

## 2022-11-12 DIAGNOSIS — E118 Type 2 diabetes mellitus with unspecified complications: Secondary | ICD-10-CM

## 2022-11-12 LAB — BASIC METABOLIC PANEL
BUN/Creatinine Ratio: 15 (ref 12–28)
BUN: 16 mg/dL (ref 8–27)
CO2: 26 mmol/L (ref 20–29)
Calcium: 10.1 mg/dL (ref 8.7–10.3)
Chloride: 97 mmol/L (ref 96–106)
Creatinine, Ser: 1.08 mg/dL — ABNORMAL HIGH (ref 0.57–1.00)
Glucose: 176 mg/dL — ABNORMAL HIGH (ref 70–99)
Potassium: 4.1 mmol/L (ref 3.5–5.2)
Sodium: 139 mmol/L (ref 134–144)
eGFR: 58 mL/min/{1.73_m2} — ABNORMAL LOW (ref >59)

## 2022-11-12 LAB — HEMOGLOBIN A1C
Est. average glucose Bld gHb Est-mCnc: 171 mg/dL
Hgb A1c MFr Bld: 7.6 % — ABNORMAL HIGH (ref 4.8–5.6)

## 2022-11-12 LAB — TSH+FREE T4
Free T4: 1.3 ng/dL (ref 0.82–1.77)
TSH: 5.74 u[IU]/mL — ABNORMAL HIGH (ref 0.450–4.500)

## 2022-11-12 MED ORDER — DAPAGLIFLOZIN PROPANEDIOL 10 MG PO TABS
10.0000 mg | ORAL_TABLET | Freq: Every day | ORAL | 2 refills | Status: DC
Start: 2022-11-12 — End: 2023-02-23

## 2022-11-13 ENCOUNTER — Other Ambulatory Visit: Payer: Self-pay | Admitting: Internal Medicine

## 2022-11-13 DIAGNOSIS — E039 Hypothyroidism, unspecified: Secondary | ICD-10-CM

## 2022-12-09 ENCOUNTER — Encounter: Payer: Self-pay | Admitting: Internal Medicine

## 2022-12-12 ENCOUNTER — Ambulatory Visit: Payer: Federal, State, Local not specified - PPO

## 2022-12-12 VITALS — BP 138/72

## 2022-12-12 DIAGNOSIS — I10 Essential (primary) hypertension: Secondary | ICD-10-CM

## 2022-12-12 NOTE — Progress Notes (Signed)
Manually checked bp and it was 138/72.

## 2022-12-14 ENCOUNTER — Other Ambulatory Visit: Payer: Self-pay | Admitting: Internal Medicine

## 2022-12-14 DIAGNOSIS — I10 Essential (primary) hypertension: Secondary | ICD-10-CM

## 2023-01-17 ENCOUNTER — Other Ambulatory Visit: Payer: Self-pay | Admitting: Internal Medicine

## 2023-01-17 DIAGNOSIS — E118 Type 2 diabetes mellitus with unspecified complications: Secondary | ICD-10-CM

## 2023-01-25 ENCOUNTER — Other Ambulatory Visit: Payer: Self-pay | Admitting: Internal Medicine

## 2023-01-25 DIAGNOSIS — E785 Hyperlipidemia, unspecified: Secondary | ICD-10-CM

## 2023-02-21 ENCOUNTER — Other Ambulatory Visit: Payer: Self-pay | Admitting: Internal Medicine

## 2023-02-21 DIAGNOSIS — E118 Type 2 diabetes mellitus with unspecified complications: Secondary | ICD-10-CM

## 2023-02-21 DIAGNOSIS — N1831 Chronic kidney disease, stage 3a: Secondary | ICD-10-CM

## 2023-03-15 ENCOUNTER — Other Ambulatory Visit: Payer: Self-pay | Admitting: Internal Medicine

## 2023-03-15 DIAGNOSIS — I10 Essential (primary) hypertension: Secondary | ICD-10-CM

## 2023-04-23 DIAGNOSIS — K080 Exfoliation of teeth due to systemic causes: Secondary | ICD-10-CM | POA: Diagnosis not present

## 2023-05-12 ENCOUNTER — Ambulatory Visit: Payer: Self-pay | Admitting: Internal Medicine

## 2023-05-21 DIAGNOSIS — H0279 Other degenerative disorders of eyelid and periocular area: Secondary | ICD-10-CM | POA: Diagnosis not present

## 2023-05-21 DIAGNOSIS — H02831 Dermatochalasis of right upper eyelid: Secondary | ICD-10-CM | POA: Diagnosis not present

## 2023-05-21 DIAGNOSIS — H02834 Dermatochalasis of left upper eyelid: Secondary | ICD-10-CM | POA: Diagnosis not present

## 2023-05-21 DIAGNOSIS — H02413 Mechanical ptosis of bilateral eyelids: Secondary | ICD-10-CM | POA: Diagnosis not present

## 2023-05-21 DIAGNOSIS — H53483 Generalized contraction of visual field, bilateral: Secondary | ICD-10-CM | POA: Diagnosis not present

## 2023-05-26 ENCOUNTER — Other Ambulatory Visit: Payer: Self-pay | Admitting: Internal Medicine

## 2023-05-26 DIAGNOSIS — N1831 Chronic kidney disease, stage 3a: Secondary | ICD-10-CM

## 2023-05-26 DIAGNOSIS — E118 Type 2 diabetes mellitus with unspecified complications: Secondary | ICD-10-CM

## 2023-06-08 DIAGNOSIS — H53483 Generalized contraction of visual field, bilateral: Secondary | ICD-10-CM | POA: Diagnosis not present

## 2023-06-09 ENCOUNTER — Other Ambulatory Visit: Payer: Self-pay | Admitting: Internal Medicine

## 2023-06-09 DIAGNOSIS — E785 Hyperlipidemia, unspecified: Secondary | ICD-10-CM

## 2023-06-14 ENCOUNTER — Other Ambulatory Visit: Payer: Self-pay | Admitting: Internal Medicine

## 2023-06-14 DIAGNOSIS — I10 Essential (primary) hypertension: Secondary | ICD-10-CM

## 2023-06-21 ENCOUNTER — Other Ambulatory Visit: Payer: Self-pay | Admitting: Internal Medicine

## 2023-06-21 DIAGNOSIS — E119 Type 2 diabetes mellitus without complications: Secondary | ICD-10-CM

## 2023-06-21 DIAGNOSIS — E785 Hyperlipidemia, unspecified: Secondary | ICD-10-CM

## 2023-06-22 ENCOUNTER — Other Ambulatory Visit: Payer: Self-pay | Admitting: Internal Medicine

## 2023-06-22 DIAGNOSIS — E119 Type 2 diabetes mellitus without complications: Secondary | ICD-10-CM

## 2023-06-22 DIAGNOSIS — I10 Essential (primary) hypertension: Secondary | ICD-10-CM

## 2023-07-01 ENCOUNTER — Encounter: Payer: Self-pay | Admitting: Internal Medicine

## 2023-07-01 ENCOUNTER — Ambulatory Visit: Admitting: Internal Medicine

## 2023-07-01 VITALS — BP 134/68 | HR 60 | Ht 63.0 in | Wt 176.0 lb

## 2023-07-01 DIAGNOSIS — I10 Essential (primary) hypertension: Secondary | ICD-10-CM | POA: Diagnosis not present

## 2023-07-01 DIAGNOSIS — E038 Other specified hypothyroidism: Secondary | ICD-10-CM | POA: Diagnosis not present

## 2023-07-01 DIAGNOSIS — N1831 Chronic kidney disease, stage 3a: Secondary | ICD-10-CM | POA: Diagnosis not present

## 2023-07-01 DIAGNOSIS — E785 Hyperlipidemia, unspecified: Secondary | ICD-10-CM

## 2023-07-01 DIAGNOSIS — E118 Type 2 diabetes mellitus with unspecified complications: Secondary | ICD-10-CM | POA: Diagnosis not present

## 2023-07-01 DIAGNOSIS — E559 Vitamin D deficiency, unspecified: Secondary | ICD-10-CM | POA: Diagnosis not present

## 2023-07-01 DIAGNOSIS — Z7984 Long term (current) use of oral hypoglycemic drugs: Secondary | ICD-10-CM

## 2023-07-01 DIAGNOSIS — E66811 Obesity, class 1: Secondary | ICD-10-CM

## 2023-07-01 MED ORDER — DAPAGLIFLOZIN PROPANEDIOL 10 MG PO TABS: 10.0000 mg | ORAL_TABLET | Freq: Every day | ORAL | 2 refills | Status: DC

## 2023-07-01 NOTE — Patient Instructions (Signed)
 It was a pleasure to see you today.  Thank you for giving us  the opportunity to be involved in your care.  Below is a brief recap of your visit and next steps.  We will plan to see you again in 6 months.  Summary No medication changes today. Farxiga  refilled Repeat labs ordered Follow up in 6 months

## 2023-07-01 NOTE — Progress Notes (Signed)
 Established Patient Office Visit  Subjective   Patient ID: Sophia Robbins, female    DOB: March 17, 1960  Age: 63 y.o. MRN: [id number]  Chief Complaint  Patient presents with   Hypertension    Follow up   Sophia Robbins returns to care today for routine follow-up.  Sophia Robbins was last evaluated by me in October 2024.  Levothyroxine  was reduced to 25 mcg daily at her request.  Repeat labs were ordered and 25-month follow-up was arranged for reassessment there have been no acute interval events.  Today Sophia Robbins reports feeling well and has no acute concerns to discuss.  Past Medical History:  Diagnosis Date   Allergy    Asthma    Diabetes mellitus without complication (HCC)    Hyperlipidemia    OSA on CPAP    Past Surgical History:  Procedure Laterality Date   APPENDECTOMY     BIOPSY  06/26/2020   Procedure: BIOPSY;  Surgeon: Urban Garden, MD;  [facility];  Service: Gastroenterology;;   CESAREAN SECTION     COLONOSCOPY WITH PROPOFOL  N/A 06/26/2020   Procedure: COLONOSCOPY WITH PROPOFOL ;  Surgeon: Urban Garden, MD;  [facility]: [facility];  Service: Gastroenterology;  Laterality: N/A;  9:00   ESOPHAGOGASTRODUODENOSCOPY (EGD) WITH PROPOFOL  N/A 06/26/2020   Procedure: ESOPHAGOGASTRODUODENOSCOPY (EGD) WITH PROPOFOL ;  Surgeon: Urban Garden, MD;  [facility]: [facility];  Service: Gastroenterology;  Laterality: N/A;   EXPLORATION POST OPERATIVE OPEN HEART     KNEE ARTHROSCOPY AND ARTHROTOMY Left    TUBAL LIGATION     Social History   Tobacco Use   Smoking status: Former    Current packs/day: 0.00    Types: Cigarettes    Quit date: 06/20/2012    Years since quitting: 11.0   Smokeless tobacco: Never  Vaping Use   Vaping status: Never Used  Substance Use Topics   Alcohol use: Yes    Alcohol/week: 3.0 standard drinks of alcohol    Types: 3 Glasses of wine per week    Comment: occasional   Drug use: Not Currently   Family History  Problem Relation  Age of Onset   Cancer Mother    Lung cancer Mother    Cancer Father    Esophageal cancer Father    Diabetes Sister    Sleep apnea Sister    COPD Brother    Sleep apnea Brother    Stomach cancer Brother    Cancer - Ovarian Paternal Grandmother    Allergies  Allergen Reactions   Other Itching, Rash, Shortness Of Breath and Swelling   Codeine Hives   Shellfish Allergy Hives   Statins Hives    Blisters and peeling of skin   Review of Systems  Constitutional:  Negative for chills and fever.  HENT:  Negative for sore throat.   Respiratory:  Negative for cough and shortness of breath.   Cardiovascular:  Negative for chest pain, palpitations and leg swelling.  Gastrointestinal:  Negative for abdominal pain, blood in stool, constipation, diarrhea, nausea and vomiting.  Genitourinary:  Negative for dysuria and hematuria.  Musculoskeletal:  Negative for myalgias.  Skin:  Negative for itching and rash.  Neurological:  Negative for dizziness and headaches.  Psychiatric/Behavioral:  Negative for depression and suicidal ideas.      Objective:     BP 134/68   Pulse 60   Ht 5' 3 (1.6 m)   Wt 176 lb (79.8 kg)   SpO2 97%   BMI 31.18 kg/m  BP Readings from Last 3 Encounters:  07/01/23 134/68  12/12/22 138/72  11/11/22 (!) 144/68   Physical Exam Vitals reviewed.  Constitutional:      General: Sophia Robbins is not in acute distress.    Appearance: Normal appearance. Sophia Robbins is obese. Sophia Robbins is not toxic-appearing.  HENT:     Head: Normocephalic and atraumatic.     Right Ear: External ear normal.     Left Ear: External ear normal.     Nose: Nose normal. No congestion or rhinorrhea.     Mouth/Throat:     Mouth: Mucous membranes are moist.     Pharynx: Oropharynx is clear. No oropharyngeal exudate or posterior oropharyngeal erythema.   Eyes:     General: No scleral icterus.    Extraocular Movements: Extraocular movements intact.     Conjunctiva/sclera: Conjunctivae normal.     Pupils: Pupils  are equal, round, and reactive to light.    Cardiovascular:     Rate and Rhythm: Normal rate and regular rhythm.     Pulses: Normal pulses.     Heart sounds: Murmur heard.     No friction rub. No gallop.  Pulmonary:     Effort: Pulmonary effort is normal.     Breath sounds: Normal breath sounds. No wheezing, rhonchi or rales.  Abdominal:     General: Abdomen is flat. Bowel sounds are normal. There is no distension.     Palpations: Abdomen is soft.     Tenderness: There is no abdominal tenderness.   Musculoskeletal:        General: No swelling. Normal range of motion.     Cervical back: Normal range of motion.     Right lower leg: No edema.     Left lower leg: No edema.  Lymphadenopathy:     Cervical: No cervical adenopathy.   Skin:    General: Skin is warm and dry.     Capillary Refill: Capillary refill takes less than 2 seconds.     Coloration: Skin is not jaundiced.   Neurological:     General: No focal deficit present.     Mental Status: Sophia Robbins is alert and oriented to person, place, and time.   Psychiatric:        Mood and Affect: Mood normal.        Behavior: Behavior normal.   Last CBC Lab Results  Component Value Date   WBC 6.5 07/01/2023   HGB 14.8 07/01/2023   HCT 43.8 07/01/2023   MCV 94 07/01/2023   MCH 31.8 07/01/2023   RDW 12.1 07/01/2023   PLT 244 07/01/2023   Last metabolic panel Lab Results  Component Value Date   GLUCOSE 113 (H) 07/01/2023   NA 137 07/01/2023   K 4.2 07/01/2023   CL 95 (L) 07/01/2023   CO2 25 07/01/2023   BUN 24 07/01/2023   CREATININE 0.96 07/01/2023   EGFR 66 07/01/2023   CALCIUM 10.1 07/01/2023   PROT 7.2 07/01/2023   ALBUMIN 4.9 07/01/2023   LABGLOB 2.3 07/01/2023   AGRATIO 2.3 (H) 05/12/2022   BILITOT 0.3 07/01/2023   ALKPHOS 79 07/01/2023   AST 30 07/01/2023   ALT 38 (H) 07/01/2023   Last lipids Lab Results  Component Value Date   CHOL 124 07/01/2023   HDL 56 07/01/2023   LDLCALC 42 07/01/2023   TRIG 155  (H) 07/01/2023   CHOLHDL 2.2 07/01/2023   Last hemoglobin A1c Lab Results  Component Value Date   HGBA1C 7.2 (H) 07/01/2023  Last thyroid  functions Lab Results  Component Value Date   TSH 4.330 07/01/2023   Last vitamin D  Lab Results  Component Value Date   VD25OH 41.3 07/01/2023   Last vitamin B12 and Folate Lab Results  Component Value Date   VITAMINB12 313 07/01/2023   FOLATE >20.0 07/01/2023     Assessment & Plan:   Problem List Items Addressed This Visit       Essential hypertension - Primary   Adequately controlled on current antihypertensive regimen.  No medication changes are indicated today.      Controlled diabetes mellitus type 2 with complications (HCC)   A1c 7.6 on labs from October.  Sophia Robbins is currently prescribed metformin  1000 mg twice daily and Farxiga  10 mg daily, which was added in light of lab results from October.  Repeat A1c and urine microalbumin/creatinine ratio ordered today.  Farxiga  refilled.      Subclinical hypothyroidism   Previous labs have been consistent with subclinical hypothyroidism.  Sophia Robbins requested to trial off of levothyroxine  at her previous appointment and has not been taking it for several months.  Sophia Robbins reports feeling well.  Repeat thyroid  studies ordered today.      CKD stage 3a, GFR 45-59 ml/min (HCC)   Previous labs have been consistent with chronic kidney disease stage 3a.  Sophia Robbins is currently on ARB and SGLT2i.  Repeat labs ordered today.      Hyperlipidemia LDL goal <70   Closely followed by cardiology and has started Repatha .  Repeat lipid panel ordered today.      Return in about 6 months (around 12/31/2023) for CPE.   Tobi Fortes, MD

## 2023-07-02 ENCOUNTER — Encounter: Payer: Self-pay | Admitting: Internal Medicine

## 2023-07-02 ENCOUNTER — Ambulatory Visit: Payer: Self-pay | Admitting: Internal Medicine

## 2023-07-02 DIAGNOSIS — N1831 Chronic kidney disease, stage 3a: Secondary | ICD-10-CM | POA: Insufficient documentation

## 2023-07-02 LAB — LIPID PANEL
Chol/HDL Ratio: 2.2 ratio (ref 0.0–4.4)
Cholesterol, Total: 124 mg/dL (ref 100–199)
HDL: 56 mg/dL (ref >39)
LDL Chol Calc (NIH): 42 mg/dL (ref 0–99)
Triglycerides: 155 mg/dL — ABNORMAL HIGH (ref 0–149)
VLDL Cholesterol Cal: 26 mg/dL (ref 5–40)

## 2023-07-02 LAB — CMP14+EGFR
ALT: 38 IU/L — ABNORMAL HIGH (ref 0–32)
AST: 30 IU/L (ref 0–40)
Albumin: 4.9 g/dL (ref 3.9–4.9)
Alkaline Phosphatase: 79 IU/L (ref 44–121)
BUN/Creatinine Ratio: 25 (ref 12–28)
BUN: 24 mg/dL (ref 8–27)
Bilirubin Total: 0.3 mg/dL (ref 0.0–1.2)
CO2: 25 mmol/L (ref 20–29)
Calcium: 10.1 mg/dL (ref 8.7–10.3)
Chloride: 95 mmol/L — ABNORMAL LOW (ref 96–106)
Creatinine, Ser: 0.96 mg/dL (ref 0.57–1.00)
Globulin, Total: 2.3 g/dL (ref 1.5–4.5)
Glucose: 113 mg/dL — ABNORMAL HIGH (ref 70–99)
Potassium: 4.2 mmol/L (ref 3.5–5.2)
Sodium: 137 mmol/L (ref 134–144)
Total Protein: 7.2 g/dL (ref 6.0–8.5)
eGFR: 66 mL/min/{1.73_m2} (ref >59)

## 2023-07-02 LAB — CBC WITH DIFFERENTIAL/PLATELET
Basophils Absolute: 0 10*3/uL (ref 0.0–0.2)
Basos: 1 %
EOS (ABSOLUTE): 0.2 10*3/uL (ref 0.0–0.4)
Eos: 3 %
Hematocrit: 43.8 % (ref 34.0–46.6)
Hemoglobin: 14.8 g/dL (ref 11.1–15.9)
Immature Grans (Abs): 0 10*3/uL (ref 0.0–0.1)
Immature Granulocytes: 0 %
Lymphocytes Absolute: 2.4 10*3/uL (ref 0.7–3.1)
Lymphs: 37 %
MCH: 31.8 pg (ref 26.6–33.0)
MCHC: 33.8 g/dL (ref 31.5–35.7)
MCV: 94 fL (ref 79–97)
Monocytes Absolute: 0.4 10*3/uL (ref 0.1–0.9)
Monocytes: 7 %
Neutrophils Absolute: 3.5 10*3/uL (ref 1.4–7.0)
Neutrophils: 52 %
Platelets: 244 10*3/uL (ref 150–450)
RBC: 4.65 x10E6/uL (ref 3.77–5.28)
RDW: 12.1 % (ref 11.7–15.4)
WBC: 6.5 10*3/uL (ref 3.4–10.8)

## 2023-07-02 LAB — HEMOGLOBIN A1C
Est. average glucose Bld gHb Est-mCnc: 160 mg/dL
Hgb A1c MFr Bld: 7.2 % — ABNORMAL HIGH (ref 4.8–5.6)

## 2023-07-02 LAB — TSH+FREE T4
Free T4: 1.26 ng/dL (ref 0.82–1.77)
TSH: 4.33 u[IU]/mL (ref 0.450–4.500)

## 2023-07-02 LAB — B12 AND FOLATE PANEL
Folate: >20 ng/mL (ref >3.0)
Vitamin B-12: 313 pg/mL (ref 232–1245)

## 2023-07-02 LAB — MICROALBUMIN / CREATININE URINE RATIO

## 2023-07-02 LAB — VITAMIN D 25 HYDROXY (VIT D DEFICIENCY, FRACTURES): Vit D, 25-Hydroxy: 41.3 ng/mL (ref 30.0–100.0)

## 2023-07-02 NOTE — Assessment & Plan Note (Signed)
 Previous labs have been consistent with chronic kidney disease stage 3a.  She is currently on ARB and SGLT2i.  Repeat labs ordered today.

## 2023-07-02 NOTE — Assessment & Plan Note (Signed)
 Previous labs have been consistent with subclinical hypothyroidism.  She requested to trial off of levothyroxine  at her previous appointment and has not been taking it for several months.  She reports feeling well.  Repeat thyroid  studies ordered today.

## 2023-07-02 NOTE — Assessment & Plan Note (Signed)
 A1c 7.6 on labs from October.  She is currently prescribed metformin  1000 mg twice daily and Farxiga  10 mg daily, which was added in light of lab results from October.  Repeat A1c and urine microalbumin/creatinine ratio ordered today.  Farxiga  refilled.

## 2023-07-02 NOTE — Assessment & Plan Note (Signed)
 Closely followed by cardiology and has started Repatha .  Repeat lipid panel ordered today.

## 2023-07-02 NOTE — Assessment & Plan Note (Signed)
 Adequately controlled on current antihypertensive regimen.  No medication changes are indicated today.

## 2023-07-07 ENCOUNTER — Other Ambulatory Visit: Payer: Self-pay | Admitting: Internal Medicine

## 2023-07-29 ENCOUNTER — Telehealth: Payer: Self-pay | Admitting: Pharmacy Technician

## 2023-07-29 ENCOUNTER — Other Ambulatory Visit (HOSPITAL_COMMUNITY): Payer: Self-pay

## 2023-07-29 NOTE — Telephone Encounter (Signed)
 Pharmacy Patient Advocate Encounter   Received notification from CoverMyMeds that prior authorization for REPATHA  is required/requested.   Insurance verification completed.   The patient is insured through [facility] .   Per test claim: PA required; PA submitted to above mentioned insurance via LATENT Key/confirmation #/EOC [id number] Status is pending    [employer]

## 2023-07-29 NOTE — Telephone Encounter (Signed)
 Pharmacy Patient Advocate Encounter  Received notification from [facility] that Prior Authorization for repatha  has been APPROVED from 07/29/23 to 07/28/24   PA #/Case ID/Reference #: [id number]

## 2023-08-02 ENCOUNTER — Other Ambulatory Visit: Payer: Self-pay | Admitting: Internal Medicine

## 2023-08-13 ENCOUNTER — Other Ambulatory Visit: Payer: Self-pay

## 2023-08-13 DIAGNOSIS — Z1231 Encounter for screening mammogram for malignant neoplasm of breast: Secondary | ICD-10-CM

## 2023-08-14 ENCOUNTER — Other Ambulatory Visit (HOSPITAL_COMMUNITY): Payer: Self-pay

## 2023-08-14 DIAGNOSIS — Z1231 Encounter for screening mammogram for malignant neoplasm of breast: Secondary | ICD-10-CM

## 2023-08-15 ENCOUNTER — Other Ambulatory Visit: Payer: Self-pay | Admitting: Internal Medicine

## 2023-08-15 DIAGNOSIS — R1013 Epigastric pain: Secondary | ICD-10-CM

## 2023-08-17 ENCOUNTER — Other Ambulatory Visit: Payer: Self-pay

## 2023-08-17 DIAGNOSIS — R1013 Epigastric pain: Secondary | ICD-10-CM

## 2023-08-17 MED ORDER — OMEPRAZOLE 40 MG PO CPDR: 40.0000 mg | DELAYED_RELEASE_CAPSULE | Freq: Every day | ORAL | 3 refills | Status: AC

## 2023-08-21 ENCOUNTER — Ambulatory Visit (HOSPITAL_COMMUNITY)
Admission: RE | Admit: 2023-08-21 | Discharge: 2023-08-21 | Disposition: A | Discharge status: Home / Self Care | Source: Ambulatory Visit

## 2023-08-21 DIAGNOSIS — Z1231 Encounter for screening mammogram for malignant neoplasm of breast: Secondary | ICD-10-CM | POA: Insufficient documentation

## 2023-09-04 ENCOUNTER — Ambulatory Visit: Payer: Self-pay

## 2023-09-04 ENCOUNTER — Ambulatory Visit
Admission: RE | Admit: 2023-09-04 | Discharge: 2023-09-04 | Disposition: A | Discharge status: Home / Self Care | Source: Ambulatory Visit | Attending: Nurse Practitioner | Admitting: Nurse Practitioner

## 2023-09-04 VITALS — BP 179/69 | HR 60 | Temp 98.3°F | Resp 18

## 2023-09-04 DIAGNOSIS — R21 Rash and other nonspecific skin eruption: Secondary | ICD-10-CM | POA: Diagnosis not present

## 2023-09-04 MED ORDER — PREDNISONE 20 MG PO TABS: 20.0000 mg | ORAL_TABLET | Freq: Every day | ORAL | 0 refills | Status: AC

## 2023-09-04 MED ORDER — TRIAMCINOLONE ACETONIDE 0.1 % EX CREA
1.0000 | TOPICAL_CREAM | Freq: Two times a day (BID) | CUTANEOUS | 0 refills | Status: AC
Start: 2023-09-04 — End: ?

## 2023-09-04 MED ORDER — DEXAMETHASONE SODIUM PHOSPHATE 10 MG/ML IJ SOLN
10.0000 mg | INTRAMUSCULAR | Status: AC
  Administered 2023-09-04: 10 mg via INTRAMUSCULAR

## 2023-09-04 NOTE — Discharge Instructions (Addendum)
 You were given an injection of Decadron  10mg  today. Monitor your blood sugar daily as the medication will cause your blood sugar to rise.  If your blood sugar exceeds 400, stop the prednisone  immediately. Take the medication as prescribed.  You will start the prednisone  on 09/05/2023. Continue your current allergy regimen. Recommend use of Aveeno colloidal oatmeal bath to help with itching and drying of the rash. Apply cool compresses to the rash to help with itching of the rash. Try to avoid scratching or manipulating the areas while symptoms persist. If symptoms fail to improve with this treatment, you may follow-up in this clinic or with your primary care physician for further evaluation. Follow-up as needed.

## 2023-09-04 NOTE — ED Triage Notes (Signed)
 Pt reports rash on the back, left shoulder, across the chest, and the right shoulder is painful, feels like needles onset Sunday. Has tried allergy pills it helps with the itching but not with the pain.

## 2023-09-04 NOTE — ED Provider Notes (Signed)
 [facility]    CSN: [id number] Arrival date & time: 09/04/23  1538      History   Chief Complaint Chief Complaint  Patient presents with   Rash    Entered by patient    HPI Augusta Mirkin is a 63 y.o. female.   The history is provided by the patient.   Patient presents with a 5-day history of rash to her back, bilateral shoulders, chest, and neck.  Patient states that the rash is itching and burning.  She denies fever, chills, or exposure to new soaps, medications, lotions, foods, or detergents.  States she has been taking cetirizine and using clobetasol  ointment with minimal relief of her symptoms.  Past Medical History:  Diagnosis Date   Allergy    Asthma    Diabetes mellitus without complication (HCC)    Hyperlipidemia    OSA on CPAP     Patient Active Problem List   Diagnosis Date Noted   CKD stage 3a, GFR 45-59 ml/min (HCC) 07/02/2023   Dyspepsia 05/12/2022   Lower extremity edema 04/03/2022   Annual physical exam 09/05/2021   Need for varicella vaccine 05/03/2021   Need for Tdap vaccination 05/03/2021   Numbness and tingling of right arm 12/26/2020   Left shoulder pain 08/23/2020   Eye exam, routine 10/27/2019   Vitamin D  deficiency 10/27/2019   Subclinical hypothyroidism 05/25/2019   OSA (obstructive sleep apnea) 04/14/2019   Essential hypertension 04/14/2019   Hyperlipidemia LDL goal <70 04/14/2019   Controlled diabetes mellitus type 2 with complications (HCC) 04/14/2019   Annual visit for general adult medical examination with abnormal findings 04/14/2019    Past Surgical History:  Procedure Laterality Date   APPENDECTOMY     BIOPSY  06/26/2020   Procedure: BIOPSY;  Surgeon: Eartha Angelia Sieving, MD;  Location: [facility];  Service: Gastroenterology;;   CESAREAN SECTION     COLONOSCOPY WITH PROPOFOL  N/A 06/26/2020   Procedure: COLONOSCOPY WITH PROPOFOL ;  Surgeon: Eartha Angelia Sieving, MD;  [facility]: [facility];  Service:  Gastroenterology;  Laterality: N/A;  9:00   ESOPHAGOGASTRODUODENOSCOPY (EGD) WITH PROPOFOL  N/A 06/26/2020   Procedure: ESOPHAGOGASTRODUODENOSCOPY (EGD) WITH PROPOFOL ;  Surgeon: Eartha Angelia Sieving, MD;  Location: [facility];  Service: Gastroenterology;  Laterality: N/A;   EXPLORATION POST OPERATIVE OPEN HEART     KNEE ARTHROSCOPY AND ARTHROTOMY Left    TUBAL LIGATION      OB History     Gravida  1   Para  1   Term  1   Preterm      AB      Living  1      SAB      IAB      Ectopic      Multiple      Live Births  1            Home Medications    Prior to Admission medications   Medication Sig Start Date End Date Taking? Authorizing Provider  aspirin EC 81 MG tablet Take 81 mg by mouth daily.    [provider]  benzonatate  (TESSALON ) 100 MG capsule Take 1 capsule (100 mg total) by mouth 2 (two) times daily as needed for cough. 09/04/21   Tobie Suzzane POUR, MD  cetirizine (ZYRTEC) 10 MG tablet Take 10 mg by mouth daily.    [provider]  chlorthalidone  (HYGROTON ) 25 MG tablet TAKE 1 TABLET (25 MG TOTAL) BY MOUTH DAILY. 06/16/23 09/14/23  Melvenia Pastor  E, MD  clobetasol  ointment (TEMOVATE ) 0.05 % Apply topically 2 times daily as needed (Skin Rash). 05/20/21   Paseda, Folashade R, FNP  dapagliflozin  propanediol (FARXIGA ) 10 MG TABS tablet Take 1 tablet (10 mg total) by mouth daily before breakfast. 07/01/23   Melvenia Manus BRAVO, MD  Evolocumab  (REPATHA  SURECLICK) 140 MG/ML SOAJ INJECT 140 MG INTO THE SKIN EVERY 14 (FOURTEEN) DAYS 01/26/23   Hilty, Vinie BROCKS, MD  ezetimibe  (ZETIA ) 10 MG tablet TAKE 1 TABLET BY MOUTH EVERY DAY 06/22/23   Dixon, Phillip E, MD  fluticasone (FLOVENT HFA) 110 MCG/ACT inhaler Inhale 2 puffs into the lungs daily as needed (Shortness of breath).    [provider]  gabapentin  (NEURONTIN ) 300 MG capsule TAKE 1 CAPSULE BY MOUTH TWICE A DAY 08/03/23   Bevely Doffing, FNP  glucose blood test strip Use as instructed 04/11/20    Elnor Fairy HERO, NP  levothyroxine  (SYNTHROID ) 25 MCG tablet TAKE 2 TABLETS (50 MCG TOTAL) BY MOUTH DAILY BEFORE BREAKFAST. 11/13/22   Melvenia Manus BRAVO, MD  losartan  (COZAAR ) 100 MG tablet TAKE 1 TABLET BY MOUTH EVERY DAY 06/22/23   Melvenia Manus BRAVO, MD  metFORMIN  (GLUCOPHAGE -XR) 500 MG 24 hr tablet TAKE 1 TAB BY MOUTH ONCE DAILY X1 WEEK, THEN INCREASE TO 1 TAB TWICE DAILY X1 WEEK, THEN 2 TABS IN THE AM AND 1 TAB IN THE PM X1 WEEK, AND FINALLY 2 TABS TWICE DAILY. TAKE WITH FOOD. 02/23/23   Melvenia Manus BRAVO, MD  nystatin cream (MYCOSTATIN) Apply 1 application  topically daily as needed for dry skin.    [provider]  omeprazole  (PRILOSEC) 40 MG capsule Take 1 capsule (40 mg total) by mouth daily. 08/17/23   Bevely Doffing, FNP  triamcinolone  cream (KENALOG ) 0.1 % Apply 1 application  topically 2 (two) times daily as needed (Rash).    [provider]    Family History Family History  Problem Relation Age of Onset   Cancer Mother    Lung cancer Mother    Cancer Father    Esophageal cancer Father    Diabetes Sister    Sleep apnea Sister    COPD Brother    Sleep apnea Brother    Stomach cancer Brother    Cancer - Ovarian Paternal Grandmother     Social History Social History   Tobacco Use   Smoking status: Former    Current packs/day: 0.00    Types: Cigarettes    Quit date: 06/20/2012    Years since quitting: 11.2   Smokeless tobacco: Never  Vaping Use   Vaping status: Never Used  Substance Use Topics   Alcohol use: Yes    Alcohol/week: 3.0 standard drinks of alcohol    Types: 3 Glasses of wine per week    Comment: occasional   Drug use: Not Currently     Allergies   Other, Codeine, Shellfish allergy, and Statins   Review of Systems Review of Systems  Skin:  Positive for rash.     Physical Exam Triage Vital Signs ED Triage Vitals  Encounter Vitals Group     BP 09/04/23 1604 (!) 179/69     Girls Systolic BP Percentile --      Girls Diastolic BP  Percentile --      Boys Systolic BP Percentile --      Boys Diastolic BP Percentile --      Pulse Rate 09/04/23 1604 60     Resp 09/04/23 1604 18     Temp  09/04/23 1604 98.3 F (36.8 C)     Temp Source 09/04/23 1604 Oral     SpO2 09/04/23 1604 97 %     Weight --      Height --      Head Circumference --      Peak Flow --      Pain Score 09/04/23 1608 7     Pain Loc --      Pain Education --      Exclude from Growth Chart --    No data found.  Updated Vital Signs BP (!) 179/69 (BP Location: Right Arm)   Pulse 60   Temp 98.3 F (36.8 C) (Oral)   Resp 18   SpO2 97%   Visual Acuity Right Eye Distance:   Left Eye Distance:   Bilateral Distance:    Right Eye Near:   Left Eye Near:    Bilateral Near:     Physical Exam Vitals and nursing note reviewed.  Constitutional:      General: She is not in acute distress.    Appearance: Normal appearance.  HENT:     Mouth/Throat:     Mouth: Mucous membranes are moist.  Eyes:     Extraocular Movements: Extraocular movements intact.     Pupils: Pupils are equal, round, and reactive to light.  Pulmonary:     Effort: Pulmonary effort is normal.  Musculoskeletal:     Cervical back: Normal range of motion.  Skin:    General: Skin is warm and dry.     Findings: Rash present. Rash is macular and papular.     Comments: Erythematous maculopapular rash to the chest, bilateral arms/shoulders and back. There is no oozing, fluctuance or drainage present.  Neurological:     General: No focal deficit present.     Mental Status: She is alert and oriented to person, place, and time.  Psychiatric:        Mood and Affect: Mood normal.        Behavior: Behavior normal.      UC Treatments / Results  Labs (all labs ordered are listed, but only abnormal results are displayed) Labs Reviewed - No data to display  EKG   Radiology No results found.  Procedures Procedures (including critical care time)  Medications Ordered in  UC Medications - No data to display  Initial Impression / Assessment and Plan / UC Course  I have reviewed the triage vital signs and the nursing notes.  Pertinent labs & imaging results that were available during my care of the patient were reviewed by me and considered in my medical decision making (see chart for details).  Decadron  10 mg IM administered for rash and inflammation.  Will start patient on prednisone  20 mg for the next 5 days.  Patient reports she is diabetic, last A1c was 7.2 in June.  Patient advised to monitor her blood glucose levels closely as the steroid can increase her readings.  Patient was given strict indications regarding discontinuation of the corticosteroids.  Will also provide symptomatic treatment with triamcinolone  cream 0.1% for patient to apply topically.  Supportive care recommendations were provided and discussed with the patient to include continuing over-the-counter antihistamines, use of Aveeno colloidal oatmeal bath, and cool compresses to the area to help with itching.  Discussed indications with patient regarding follow-up.  Patient was in agreement with this plan of care and verbalizes understanding.  All questions were answered.  Patient stable for discharge.  Final Clinical Impressions(s) /  UC Diagnoses   Final diagnoses:  None   Discharge Instructions   None    ED Prescriptions   None    PDMP not reviewed this encounter.   Gilmer Etta PARAS, NP 09/04/23 1906

## 2023-09-04 NOTE — Telephone Encounter (Addendum)
 FYI Only or Action Required?: FYI only for provider.  Patient was last seen in primary care on 07/01/2023 by Sophia Manus BRAVO, MD.  Called Nurse Triage reporting Herpes Zoster and Rash.  Symptoms began several days ago.  Interventions attempted: OTC medications: allergy meds.  Symptoms are: gradually worsening.  Triage Disposition: See Physician Within 24 Hours  Patient/caregiver understands and will follow disposition?: Yes     Copied from CRM #[id number]. Topic: Clinical - Red Word Triage >> Sep 04, 2023  8:19 AM Sophia Robbins wrote: Red Word that prompted transfer to Nurse Triage: Patient thinks she may have shingles, on her back, chest, head, and face. Says that are likes hives that are painful, blistering, itchy, and spreading. Reason for Disposition  SEVERE pain (e.g., excruciating)  Answer Assessment - Initial Assessment Questions 1. APPEARANCE of RASH: What does the rash look like?      Shingle-like -- looks more like hives Endorses and environmental/pharmaceutical changes 2. LOCATION: Where is the rash located?      Back, chest, head, face 3. ONSET: When did the rash start?      Sunday 4. ITCHING: Does the rash itch? If Yes, ask: How bad is the itch?  (Scale 1-10; or mild, moderate, severe)     Really bad Endorses taking allergy medication Monday with minimal relief 5. PAIN: Does the rash hurt? If Yes, ask: How bad is the pain?  (Scale 0-10; or none, mild, moderate, severe)     Yes, 7/10 Endorses prickly pain 6. OTHER SYMPTOMS: Do you have any other symptoms? (e.g., fever)     denies 7. PREGNANCY: Is there any chance you are pregnant? When was your last menstrual period?     N/a    Triager attempted to schedule with PCP, but no access. Triager scheduled with [facility] instead.  Protocols used: Shingles (Zoster)-A-AH

## 2023-09-11 ENCOUNTER — Encounter: Payer: Self-pay | Admitting: Radiology

## 2023-09-13 ENCOUNTER — Other Ambulatory Visit: Payer: Self-pay | Admitting: Internal Medicine

## 2023-09-13 ENCOUNTER — Ambulatory Visit: Payer: Self-pay

## 2023-09-13 DIAGNOSIS — I10 Essential (primary) hypertension: Secondary | ICD-10-CM

## 2023-09-14 ENCOUNTER — Other Ambulatory Visit: Payer: Self-pay | Admitting: Internal Medicine

## 2023-09-14 DIAGNOSIS — E118 Type 2 diabetes mellitus with unspecified complications: Secondary | ICD-10-CM

## 2023-09-30 ENCOUNTER — Encounter: Payer: Self-pay | Admitting: Internal Medicine

## 2023-09-30 NOTE — Telephone Encounter (Signed)
 error

## 2023-10-02 ENCOUNTER — Telehealth: Payer: Self-pay

## 2023-10-02 NOTE — Telephone Encounter (Unsigned)
 Copied from CRM #[id number]. Topic: Clinical - Medical Advice >> Oct 02, 2023 12:56 PM Tiffini S wrote: Reason for CRM: Sophia Robbins with [facility] [phone] ext [phone] called to request a medical clearance for surgery/ procedure- upper eye lid on 10/07/23 at 2:15pm Will fax a request today for medical clearance- please call to confirm fax is received.

## 2023-10-02 NOTE — Telephone Encounter (Signed)
 Left message for pt to call our office and schedule IN OFFICE Preop appt.  Will update the surgeons office

## 2023-10-02 NOTE — Telephone Encounter (Signed)
 [facility] medical clearance for bilateral eyelid _ Surgery scheduled 10/07/2023  Noted Copied Sleeved  Original placed in provider box Copy placed front desk folder

## 2023-10-02 NOTE — Telephone Encounter (Signed)
 [facility] calling to follow up about clearance. Please advise.

## 2023-10-02 NOTE — Telephone Encounter (Signed)
   Name: Sophia Robbins  DOB: 11/20/1960  MRN: [id number]  Primary Cardiologist: None  Chart reviewed as part of pre-operative protocol coverage. Because of [name]'s past medical history and time since last visit, she will require a follow-up in-office visit in order to better assess preoperative cardiovascular risk.  Last seen by Dr. Mona 08/08/2022.  Procedure on 10/07/2023.  If she cannot be seen in the office before her surgery, it will have to be postponed and surgeon notified.   Pre-op covering staff: - Please schedule appointment and call patient to inform them. If patient already had an upcoming appointment within acceptable timeframe, please add pre-op clearance to the appointment notes so provider is aware. - Please contact requesting surgeon's office via preferred method (i.e, phone, fax) to inform them of need for appointment prior to surgery.   [name], NP  10/02/2023, 12:17 PM

## 2023-10-02 NOTE — Telephone Encounter (Signed)
   Pre-operative Risk Assessment    Patient Name: Sophia Robbins  DOB: Dec 30, 1960 MRN: [id number]   Date of last office visit: 08/08/22 VINIE MARTINET, MD Date of next office visit: NONE   Request for Surgical Clearance    Procedure:  BILATERAL UPPER EYELID BLEPHAROPLASTY  Date of Surgery:  Clearance 10/07/23                                Surgeon:  DR REFUGIO NANNY Surgeon's Group or Practice Name:  [facility] Phone number:  [phone] Fax number:  [phone]   Type of Clearance Requested:   - Medical  - Pharmacy:  Hold Aspirin     Type of Anesthesia:  Not Indicated   Additional requests/questions:    SignedLucie DELENA Ku   10/02/2023, 12:15 PM

## 2023-10-05 ENCOUNTER — Telehealth: Payer: Self-pay | Admitting: Internal Medicine

## 2023-10-05 ENCOUNTER — Encounter: Payer: Self-pay | Admitting: *Deleted

## 2023-10-05 NOTE — Telephone Encounter (Signed)
 Pt scheduled for tomorrow 09/16 at 1:55PM with Lum Louis, NP for preop clearance.

## 2023-10-05 NOTE — Telephone Encounter (Signed)
 Pt returning call from Friday to schedule preop appt. Please advise.

## 2023-10-05 NOTE — Telephone Encounter (Signed)
 Returned patients call this morning and no answer. Had to leave a voicemail for the patient to call our office to get scheduled for IN OFFICE appt for preop clearance to be addressed.  2nd attempt  Will update the surgeons office

## 2023-10-05 NOTE — Telephone Encounter (Signed)
 Pt IN OFFICE appt 10/06/23 with Lum Louis, NP  Will update the surgeons office

## 2023-10-05 NOTE — Telephone Encounter (Signed)
 error

## 2023-10-06 ENCOUNTER — Encounter: Payer: Self-pay | Admitting: Emergency Medicine

## 2023-10-06 ENCOUNTER — Ambulatory Visit: Attending: Emergency Medicine | Admitting: Emergency Medicine

## 2023-10-06 VITALS — BP 130/68 | HR 57 | Ht 63.0 in | Wt 172.0 lb

## 2023-10-06 DIAGNOSIS — Z01810 Encounter for preprocedural cardiovascular examination: Secondary | ICD-10-CM

## 2023-10-06 DIAGNOSIS — E119 Type 2 diabetes mellitus without complications: Secondary | ICD-10-CM

## 2023-10-06 DIAGNOSIS — I10 Essential (primary) hypertension: Secondary | ICD-10-CM | POA: Diagnosis not present

## 2023-10-06 DIAGNOSIS — E785 Hyperlipidemia, unspecified: Secondary | ICD-10-CM | POA: Diagnosis not present

## 2023-10-06 DIAGNOSIS — R011 Cardiac murmur, unspecified: Secondary | ICD-10-CM | POA: Diagnosis not present

## 2023-10-06 DIAGNOSIS — G4733 Obstructive sleep apnea (adult) (pediatric): Secondary | ICD-10-CM

## 2023-10-06 NOTE — Telephone Encounter (Signed)
 Preop clearance OV notes now re-routed to surgeons office.

## 2023-10-06 NOTE — Patient Instructions (Signed)
 Medication Instructions:  NO CHANGES   Lab Work: NONE   Testing/Procedures: Your physician has requested that you have an ECHOCARDIOGRAM. Echocardiography is a painless test that uses sound waves to create images of your heart. It provides your doctor with information about the size and shape of your heart and how well your heart's chambers and valves are working. This procedure takes approximately one hour. There are no restrictions for this procedure. Please do NOT wear cologne, perfume, aftershave, or lotions (deodorant is allowed). Please arrive 15 minutes prior to your appointment time.  Please note: We ask at that you not bring children with you during ultrasound (echo/ vascular) testing. Due to room size and safety concerns, children are not allowed in the ultrasound rooms during exams. Our front office staff cannot provide observation of children in our lobby area while testing is being conducted. An adult accompanying a patient to their appointment will only be allowed in the ultrasound room at the discretion of the ultrasound technician under special circumstances. We apologize for any inconvenience.   Follow-Up: At [facility], you and your health needs are our priority.  As part of our continuing mission to provide you with exceptional heart care, our providers are all part of one team.  This team includes your primary Cardiologist (physician) and Advanced Practice Providers or APPs (Physician Assistants and Nurse Practitioners) who all work together to provide you with the care you need, when you need it.  Your next appointment:   1 YEAR  Provider:   MADISON FOUNTAIN, NP

## 2023-10-06 NOTE — Progress Notes (Signed)
 Cardiology Office Note:    Date:  10/06/2023  ID:  Sophia Robbins, DOB Jul 10, 1960, MRN [id number] PCP: Bevely Doffing, FNP  [facility] Providers Cardiologist:  Vinie JAYSON Maxcy, MD Cardiology APP:  Rana Lum CROME, NP       Patient Profile:       Chief Complaint: Preoperative cardiovascular clearance History of Present Illness:  Sophia Robbins is a 63 y.o. female with visit-pertinent history of hyperlipidemia, T2DM, asthma and allergies, obstructive sleep apnea on CPAP, subclinical hypothyroidism, hypertension  Patient established with cardiology service on 03/20/2022 for evaluation of dyslipidemia.  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  Her hyperlipidemia was well-controlled on combination of Repatha  and ezetimibe  however her prior authorization for Repatha  had run out was not renewed by her PCP.  She remained on ezetimibe .  Her LDL was elevated at 107.  She was resumed on Repatha   She was last seen in clinic on 08/08/2022.  Her cholesterol was much improved now on Repatha .  LDL-C has improved to 43.  No med changes were made she was to follow-up in 1 year.  She is now pending bilateral upper eyelid blepharoplasty on 10/07/2023 with [employer] [facility].   Discussed the use of AI scribe software for clinical note transcription with the patient, who gave verbal consent to proceed.  History of Present Illness Sophia Robbins is a 63 year old female who presents for preoperative clearance for an upcoming procedure.   She is scheduled for a procedure due to loss of peripheral vision, which is concerning to her.  She is originally from [place] .  Moved to [place]  5 years ago to be closer to family.  She works for the [employer] in [employer].  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 A heart murmur was identified over ten years  ago, with an echocardiogram performed at that time while living in [place] .  Today she is without any acute cardiovascular concerns or complaints.  She denies any chest pains, dyspnea, or any exertional symptoms. She is very active, walking over 10,000 steps a day at work and engaging in weekend walks and gardening. She does not smoke and drinks red wine with dinner. She uses a CPAP machine nightly for sleep apnea.  She denies any orthopnea, PND, LEE, syncope, presyncope, lightheadedness, dizziness, palpitations, hematochezia, melena.   Review of systems:  Please see the history of present illness. All other systems are reviewed and otherwise negative.      Studies Reviewed:    EKG Interpretation Date/Time:  Tuesday October 06 2023 13:58:39 EDT Ventricular Rate:  57 PR Interval:  156 QRS Duration:  110 QT Interval:  414 QTC Calculation: 402 R Axis:   -44  Text Interpretation: Sinus bradycardia Left axis deviation ST & T wave abnormality No previous ECGs available Confirmed by Rana Lum [phone]) on 10/06/2023 2:45:09 PM     Risk Assessment/Calculations:              Physical Exam:   VS:  BP 130/68 (BP Location: Left Arm, Patient Position: Sitting, Cuff Size: Normal)   Pulse (!) 57   Ht 5' 3 (1.6 m)   Wt 172 lb (78 kg)   BMI 30.47 kg/m    Wt Readings from Last 3 Encounters:  10/06/23 172 lb (78 kg)  07/01/23 176 lb (79.8 kg)  11/11/22 182 lb (82.6 kg)  GEN: Well nourished, well developed in no acute distress NECK: No JVD; No carotid bruits CARDIAC: RRR.  3/6 murmur best heard RUSB.  No rubs, gallops RESPIRATORY:  Clear to auscultation without rales, wheezing or rhonchi  ABDOMEN: Soft, non-tender, non-distended EXTREMITIES:  No edema; No acute deformity      Assessment and Plan:  Hyperlipidemia, LDL goal <70 LDL 42 on 06/2023 and well-controlled No history of CAD.  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  Now managed on Repatha  - Continue Repatha  140 mg q. 14 days and ezetimibe  10 mg daily  Obstructive sleep apnea - Remains adherent to CPAP therapy  Hypertension Blood pressure today is 130/68 well-controlled - Continue chlorthalidone  25 mg daily and losartan  100 mg daily  T2DM A1c 7.2% on 06/2023 - Managed on Farxiga  and metformin  - Management per PCP  Murmur She does have 3/6 murmur noted to RUSB She reports prior echocardiogram at least 20 years ago while living in [place] , no records available at this time - She remains entirely asymptomatic and very active with no dyspnea, syncope, presyncope, or chest pains - Will plan for echocardiogram for routine monitoring  Preoperative cardiovascular clearance She remains active without any exertional symptoms  According to the Revised Cardiac Risk Index (RCRI), her Perioperative Risk of Major Cardiac Event is (%): 0.4. Her Functional Capacity in METs is: 7.59 according to the Duke Activity Status Index (DASI). Therefore, based on ACC/AHA guidelines, patient would be at acceptable risk for the planned procedure without further cardiovascular testing. I will route this recommendation to the requesting party via Epic fax function.  She may hold aspirin for 5-7 days prior to procedure. Please resume aspirin as soon as possible postprocedure, at the discretion of the surgeon.        Dispo:  Return in about 1 year (around 10/05/2024).  Signed, Lum LITTIE Louis, NP

## 2023-10-06 NOTE — Telephone Encounter (Signed)
Calling for update on clearance. Please advise

## 2023-10-07 DIAGNOSIS — H53483 Generalized contraction of visual field, bilateral: Secondary | ICD-10-CM | POA: Diagnosis not present

## 2023-10-07 DIAGNOSIS — H02834 Dermatochalasis of left upper eyelid: Secondary | ICD-10-CM | POA: Diagnosis not present

## 2023-10-07 DIAGNOSIS — H02831 Dermatochalasis of right upper eyelid: Secondary | ICD-10-CM | POA: Diagnosis not present

## 2023-11-03 ENCOUNTER — Telehealth: Payer: Federal, State, Local not specified - PPO | Admitting: Adult Health

## 2023-11-13 ENCOUNTER — Ambulatory Visit (HOSPITAL_COMMUNITY)
Admission: RE | Admit: 2023-11-13 | Discharge: 2023-11-13 | Disposition: A | Discharge status: Home / Self Care | Source: Ambulatory Visit | Attending: Emergency Medicine | Admitting: Emergency Medicine

## 2023-11-13 DIAGNOSIS — I10 Essential (primary) hypertension: Secondary | ICD-10-CM | POA: Insufficient documentation

## 2023-11-13 DIAGNOSIS — R011 Cardiac murmur, unspecified: Secondary | ICD-10-CM | POA: Insufficient documentation

## 2023-11-13 DIAGNOSIS — E785 Hyperlipidemia, unspecified: Secondary | ICD-10-CM | POA: Insufficient documentation

## 2023-11-13 LAB — ECHOCARDIOGRAM COMPLETE
Area-P 1/2: 3.78 cm2
S' Lateral: 3 cm

## 2023-11-16 ENCOUNTER — Ambulatory Visit: Payer: Self-pay | Admitting: Emergency Medicine

## 2023-11-16 NOTE — Progress Notes (Unsigned)
 SABRA

## 2023-11-17 ENCOUNTER — Telehealth (INDEPENDENT_AMBULATORY_CARE_PROVIDER_SITE_OTHER): Admitting: Adult Health

## 2023-11-17 DIAGNOSIS — G4733 Obstructive sleep apnea (adult) (pediatric): Secondary | ICD-10-CM | POA: Diagnosis not present

## 2023-11-17 NOTE — Progress Notes (Signed)
 PATIENT: Sophia Robbins DOB: 1960-11-13  REASON FOR VISIT: follow up HISTORY FROM: patient PRIMARY NEUROLOGIST: Dr. Buck   Virtual Visit via Video Note  I connected with Sophia Robbins on 11/17/23 at  2:45 PM EDT by a video enabled telemedicine application located remotely at [facility] and verified that I am speaking with the correct person using two identifiers who was located at their own home.   I discussed the limitations of evaluation and management by telemedicine and the availability of in person appointments. The patient expressed understanding and agreed to proceed.   PATIENT: Sophia Robbins DOB: Nov 11, 1960  REASON FOR VISIT: follow up HISTORY FROM: patient  HISTORY OF PRESENT ILLNESS: Today 11/17/23:  Sophia Robbins is a 63 y.o. female with a history of OSA on CPAP. Returns today for follow-up.  Reports that CPAP is working well.  Denies any new issues.  Currently has the nasal pillows.  Reports that she will be retiring in March.  Download is below      11/03/22: Sophia Robbins is a 63 y.o. female with a history of OSA on CPAP. Returns today for follow-up. Reports that CPAP is working well. Denies any new issues. DL is below.        10/29/21: Sophia Robbins is a 63 year old female with a history of obstructive sleep apnea on CPAP.  She returns today for follow-up.  Her download is below.  She states the CPAP is working well.  She has noticed more sleepiness but states that she started a new medication and she feels that this is the cause.  She returns today for an evaluation.   REVIEW OF SYSTEMS: Out of a complete 14 system review of symptoms, the patient complains only of the following symptoms, and all other reviewed systems are negative.  ALLERGIES: Allergies  Allergen Reactions   Other Itching, Rash, Shortness Of Breath and Swelling   Codeine Hives   Shellfish Allergy Hives   Statins Hives    Blisters and peeling of skin    HOME  MEDICATIONS: Outpatient Medications Prior to Visit  Medication Sig Dispense Refill   aspirin EC 81 MG tablet Take 81 mg by mouth daily.     cetirizine (ZYRTEC) 10 MG tablet Take 10 mg by mouth daily.     chlorthalidone  (HYGROTON ) 25 MG tablet TAKE 1 TABLET (25 MG TOTAL) BY MOUTH DAILY. 90 tablet 0   clobetasol  ointment (TEMOVATE ) 0.05 % Apply topically 2 times daily as needed (Skin Rash). 30 g 1   dapagliflozin  propanediol (FARXIGA ) 10 MG TABS tablet Take 1 tablet (10 mg total) by mouth daily before breakfast. 30 tablet 2   Evolocumab  (REPATHA  SURECLICK) 140 MG/ML SOAJ INJECT 140 MG INTO THE SKIN EVERY 14 (FOURTEEN) DAYS 6 mL 3   ezetimibe  (ZETIA ) 10 MG tablet TAKE 1 TABLET BY MOUTH EVERY DAY 90 tablet 2   fluticasone (FLOVENT HFA) 110 MCG/ACT inhaler Inhale 2 puffs into the lungs daily as needed (Shortness of breath).     gabapentin  (NEURONTIN ) 300 MG capsule TAKE 1 CAPSULE BY MOUTH TWICE A DAY 180 capsule 0   glucose blood test strip Use as instructed 100 each 12   levothyroxine  (SYNTHROID ) 25 MCG tablet TAKE 2 TABLETS (50 MCG TOTAL) BY MOUTH DAILY BEFORE BREAKFAST. 180 tablet 0   losartan  (COZAAR ) 100 MG tablet TAKE 1 TABLET BY MOUTH EVERY DAY 90 tablet 2   metFORMIN  (GLUCOPHAGE -XR) 500 MG 24 hr tablet TAKE 1 TAB BY MOUTH ONCE DAILY X1 WEEK,  THEN INCREASE TO 1 TAB TWICE DAILY X1 WEEK, THEN 2 TABS IN THE AM AND 1 TAB IN THE PM X1 WEEK, AND FINALLY 2 TABS TWICE DAILY. TAKE WITH FOOD. 360 tablet 1   nystatin cream (MYCOSTATIN) Apply 1 application  topically daily as needed for dry skin.     omeprazole  (PRILOSEC) 40 MG capsule Take 1 capsule (40 mg total) by mouth daily. 90 capsule 3   triamcinolone  cream (KENALOG ) 0.1 % Apply 1 Application topically 2 (two) times daily. 80 g 0   No facility-administered medications prior to visit.    PAST MEDICAL HISTORY: Past Medical History:  Diagnosis Date   Allergy    Asthma    Diabetes mellitus without complication (HCC)    Hyperlipidemia    OSA on  CPAP     PAST SURGICAL HISTORY: Past Surgical History:  Procedure Laterality Date   APPENDECTOMY     BIOPSY  06/26/2020   Procedure: BIOPSY;  Surgeon: Eartha Angelia Sieving, MD;  [facility];  Service: Gastroenterology;;   CESAREAN SECTION     COLONOSCOPY WITH PROPOFOL  N/A 06/26/2020   Procedure: COLONOSCOPY WITH PROPOFOL ;  Surgeon: Eartha Angelia Sieving, MD;  Location: [facility];  Service: Gastroenterology;  Laterality: N/A;  9:00   ESOPHAGOGASTRODUODENOSCOPY (EGD) WITH PROPOFOL  N/A 06/26/2020   Procedure: ESOPHAGOGASTRODUODENOSCOPY (EGD) WITH PROPOFOL ;  Surgeon: Eartha Angelia Sieving, MD;  [facility];  Service: Gastroenterology;  Laterality: N/A;   EXPLORATION POST OPERATIVE OPEN HEART     KNEE ARTHROSCOPY AND ARTHROTOMY Left    TUBAL LIGATION      FAMILY HISTORY: Family History  Problem Relation Age of Onset   Cancer Mother    Lung cancer Mother    Cancer Father    Esophageal cancer Father    Diabetes Sister    Sleep apnea Sister    COPD Brother    Sleep apnea Brother    Stomach cancer Brother    Cancer - Ovarian Paternal Grandmother     SOCIAL HISTORY: Social History   Socioeconomic History   Marital status: Married    Spouse name: Not on file   Number of children: 1   Years of education: Not on file   Highest education level: Associate degree: occupational, scientist, product/process development, or vocational program  Occupational History   Occupation: public relations account executive    Comment: [employer]  Tobacco Use   Smoking status: Former    Current packs/day: 0.00    Types: Cigarettes    Quit date: 06/20/2012    Years since quitting: 11.4   Smokeless tobacco: Never  Vaping Use   Vaping status: Never Used  Substance and Sexual Activity   Alcohol use: Yes    Alcohol/week: 3.0 standard drinks of alcohol    Types: 3 Glasses of wine per week    Comment: occasional   Drug use: Not Currently   Sexual activity: Yes    Birth control/protection: Surgical     Comment: tubal  Other Topics Concern   Not on file  Social History Narrative   Lives with husband of 39 years    1 daughter -granddaughter and grandson      Two cats-outside      Enjoys: crafts, WEB DESIGNER, redoing home       Diet: eat all food groups, avoids milk -little intolerance   Caffeine: 1 cup coffee, unsweet iced tea   Water: 6 cups daily       Wears seat belt   Smoke detectors at home  Does not use phone while driving    Social Drivers of Health   Financial Resource Strain: Low Risk  (11/10/2022)   Overall Financial Resource Strain (CARDIA)    Difficulty of Paying Living Expenses: Not very hard  Food Insecurity: No Food Insecurity (11/10/2022)   Hunger Vital Sign    Worried About Running Out of Food in the Last Year: Never true    Ran Out of Food in the Last Year: Never true  Transportation Needs: No Transportation Needs (11/10/2022)   PRAPARE - Administrator, Civil Service (Medical): No    Lack of Transportation (Non-Medical): No  Physical Activity: Insufficiently Active (11/10/2022)   Exercise Vital Sign    Days of Exercise per Week: 3 days    Minutes of Exercise per Session: 20 min  Stress: No Stress Concern Present (11/10/2022)   [employer] of Occupational Health - Occupational Stress Questionnaire    Feeling of Stress : Not at all  Social Connections: Unknown (11/10/2022)   Social Connection and Isolation Panel    Frequency of Communication with Friends and Family: More than three times a week    Frequency of Social Gatherings with Friends and Family: Once a week    Attends Religious Services: Patient declined    Database Administrator or Organizations: No    Attends Engineer, Structural: Not on file    Marital Status: Married  Catering Manager Violence: Not on file      PHYSICAL EXAM Generalized: Well developed, in no acute distress   Neurological examination  Mentation: Alert oriented to time, place, history taking.  Follows all commands speech and language fluent Cranial nerve II-XII: Facial symmetry noted.  DIAGNOSTIC DATA (LABS, IMAGING, TESTING) - I reviewed patient records, labs, notes, testing and imaging myself where available.  Lab Results  Component Value Date   WBC 6.5 07/01/2023   HGB 14.8 07/01/2023   HCT 43.8 07/01/2023   MCV 94 07/01/2023   PLT 244 07/01/2023      Component Value Date/Time   NA 137 07/01/2023 1650   K 4.2 07/01/2023 1650   CL 95 (L) 07/01/2023 1650   CO2 25 07/01/2023 1650   GLUCOSE 113 (H) 07/01/2023 1650   GLUCOSE 104 (H) 11/10/2019 0818   BUN 24 07/01/2023 1650   CREATININE 0.96 07/01/2023 1650   CREATININE 0.91 11/10/2019 0818   CALCIUM 10.1 07/01/2023 1650   PROT 7.2 07/01/2023 1650   ALBUMIN 4.9 07/01/2023 1650   AST 30 07/01/2023 1650   ALT 38 (H) 07/01/2023 1650   ALKPHOS 79 07/01/2023 1650   BILITOT 0.3 07/01/2023 1650   GFRNONAA 79 01/30/2020 1622   GFRNONAA 69 11/10/2019 0818   GFRAA 91 01/30/2020 1622   GFRAA 79 11/10/2019 0818   Lab Results  Component Value Date   CHOL 124 07/01/2023   HDL 56 07/01/2023   LDLCALC 42 07/01/2023   TRIG 155 (H) 07/01/2023   CHOLHDL 2.2 07/01/2023   Lab Results  Component Value Date   HGBA1C 7.2 (H) 07/01/2023   Lab Results  Component Value Date   VITAMINB12 313 07/01/2023   Lab Results  Component Value Date   TSH 4.330 07/01/2023      ASSESSMENT AND PLAN 63 y.o. year old female  has a past medical history of Allergy, Asthma, Diabetes mellitus without complication (HCC), Hyperlipidemia, and OSA on CPAP. here with:  OSA on CPAP  CPAP compliance excellent Residual AHI is good Encouraged patient to continue using  CPAP nightly and > 4 hours each night F/U in 1 year or sooner if needed  Duwaine Russell, MSN, NP-C 11/17/2023, 2:42 PM [facility] [address], [address] [place], [place] [address] [phone]

## 2023-11-23 ENCOUNTER — Encounter: Payer: Self-pay | Admitting: Radiology

## 2023-11-23 NOTE — Telephone Encounter (Signed)
 Pt returning call for echo results. She asked if someone can call at 4 or after 4.

## 2023-12-11 ENCOUNTER — Telehealth: Payer: Self-pay | Admitting: Adult Health

## 2023-12-11 NOTE — Telephone Encounter (Signed)
 Pt called  stating that she was waiting on [facility] to call about CPAP supplies . Pt stated that they called this morning  and informed her that they did not have any record for Pt. Pt is requesting for new Order  to be sent over to [facility] for CPAP Supplies

## 2023-12-11 NOTE — Telephone Encounter (Signed)
Faxed CPAP supplies order to [facility] at [phone]. Received fax confirmation.

## 2023-12-13 ENCOUNTER — Other Ambulatory Visit: Payer: Self-pay

## 2023-12-13 DIAGNOSIS — I10 Essential (primary) hypertension: Secondary | ICD-10-CM

## 2023-12-14 DIAGNOSIS — G4733 Obstructive sleep apnea (adult) (pediatric): Secondary | ICD-10-CM | POA: Diagnosis not present

## 2023-12-27 ENCOUNTER — Other Ambulatory Visit: Payer: Self-pay | Admitting: Internal Medicine

## 2023-12-27 DIAGNOSIS — N1831 Chronic kidney disease, stage 3a: Secondary | ICD-10-CM

## 2023-12-27 DIAGNOSIS — E118 Type 2 diabetes mellitus with unspecified complications: Secondary | ICD-10-CM

## 2023-12-31 ENCOUNTER — Other Ambulatory Visit: Payer: Self-pay | Admitting: Nurse Practitioner

## 2024-01-08 ENCOUNTER — Ambulatory Visit (INDEPENDENT_AMBULATORY_CARE_PROVIDER_SITE_OTHER)

## 2024-01-08 VITALS — BP 146/66 | HR 67 | Ht 63.0 in | Wt 175.0 lb

## 2024-01-08 DIAGNOSIS — N1831 Chronic kidney disease, stage 3a: Secondary | ICD-10-CM | POA: Diagnosis not present

## 2024-01-08 DIAGNOSIS — E038 Other specified hypothyroidism: Secondary | ICD-10-CM

## 2024-01-08 DIAGNOSIS — E118 Type 2 diabetes mellitus with unspecified complications: Secondary | ICD-10-CM | POA: Diagnosis not present

## 2024-01-08 DIAGNOSIS — E559 Vitamin D deficiency, unspecified: Secondary | ICD-10-CM

## 2024-01-08 DIAGNOSIS — Z7984 Long term (current) use of oral hypoglycemic drugs: Secondary | ICD-10-CM | POA: Diagnosis not present

## 2024-01-08 DIAGNOSIS — I10 Essential (primary) hypertension: Secondary | ICD-10-CM

## 2024-01-08 MED ORDER — TIRZEPATIDE 2.5 MG/0.5ML ~~LOC~~ SOAJ: 2.5000 mg | SUBCUTANEOUS | 1 refills | Status: DC

## 2024-01-08 NOTE — Progress Notes (Unsigned)
" ° °  Established Patient Office Visit  Subjective   Patient ID: Sophia Robbins, female    DOB: 01-12-1961  Age: 63 y.o. MRN: [id number]  Chief Complaint  Patient presents with   Medical Management of Chronic Issues    6 month follow up    HPI  Patient Active Problem List   Diagnosis Date Noted   CKD stage 3a, GFR 45-59 ml/min (HCC) 07/02/2023   Dyspepsia 05/12/2022   Lower extremity edema 04/03/2022   Annual physical exam 09/05/2021   Need for varicella vaccine 05/03/2021   Need for Tdap vaccination 05/03/2021   Numbness and tingling of right arm 12/26/2020   Left shoulder pain 08/23/2020   Eye exam, routine 10/27/2019   Vitamin D  deficiency 10/27/2019   Subclinical hypothyroidism 05/25/2019   OSA (obstructive sleep apnea) 04/14/2019   Essential hypertension 04/14/2019   Hyperlipidemia LDL goal <70 04/14/2019   Controlled diabetes mellitus type 2 with complications (HCC) 04/14/2019   Annual visit for general adult medical examination with abnormal findings 04/14/2019    ROS    Objective:     BP (!) 146/66   Pulse 67   Ht 5' 3 (1.6 m)   Wt 175 lb 0.6 oz (79.4 kg)   SpO2 96%   BMI 31.01 kg/m  BP Readings from Last 3 Encounters:  01/08/24 (!) 146/66  10/06/23 130/68  09/04/23 (!) 179/69   Wt Readings from Last 3 Encounters:  01/08/24 175 lb 0.6 oz (79.4 kg)  10/06/23 172 lb (78 kg)  07/01/23 176 lb (79.8 kg)     Physical Exam Vitals and nursing note reviewed.  Constitutional:      Appearance: Normal appearance.  HENT:     Head: Normocephalic.  Eyes:     Extraocular Movements: Extraocular movements intact.     Pupils: Pupils are equal, round, and reactive to light.  Cardiovascular:     Rate and Rhythm: Normal rate and regular rhythm.  Pulmonary:     Effort: Pulmonary effort is normal.     Breath sounds: Normal breath sounds.  Musculoskeletal:     Cervical back: Normal range of motion and neck supple.  Neurological:     Mental Status: She is alert and  oriented to person, place, and time.  Psychiatric:        Mood and Affect: Mood normal.        Thought Content: Thought content normal.    {Perform Simple Foot Exam  Perform Detailed exam:1} Diabetic foot exam was performed with the following findings:   No deformities, ulcerations, or other skin breakdown Normal sensation of 10g monofilament Intact posterior tibialis and dorsalis pedis pulses      No results found for any visits on 01/08/24.  {Labs (Optional):23779}  The ASCVD Risk score (Arnett DK, et al., 2019) failed to calculate for the following reasons:   The valid total cholesterol range is 130 to 320 mg/dL    Assessment & Plan:   Problem List Items Addressed This Visit   None   No follow-ups on file.    Leita Longs, FNP  "

## 2024-01-10 NOTE — Assessment & Plan Note (Signed)
 Farxiga  no longer covered by insurance. Ozempic  previously effective. Mounjaro  considered for efficacy and cardiovascular benefits. - Prescribed Mounjaro . - Ordered lab tests for diabetes management.

## 2024-01-10 NOTE — Assessment & Plan Note (Signed)
 Recheck vitamin D  level

## 2024-01-10 NOTE — Assessment & Plan Note (Signed)
 Previous labs have been consistent with chronic kidney disease stage 3a.  She is currently on ARB and SGLT2i.  Repeat labs ordered today.

## 2024-01-10 NOTE — Assessment & Plan Note (Signed)
 Adequately controlled on current antihypertensive regimen.  No medication changes are indicated today.

## 2024-01-10 NOTE — Assessment & Plan Note (Signed)
 Repeat thyroid  labs.  Will adjust dose of levothyroxine  if indicated.

## 2024-01-11 ENCOUNTER — Telehealth: Payer: Self-pay | Admitting: Pharmacy Technician

## 2024-01-11 ENCOUNTER — Other Ambulatory Visit (HOSPITAL_COMMUNITY): Payer: Self-pay

## 2024-01-11 NOTE — Telephone Encounter (Signed)
 Pharmacy Patient Advocate Encounter   Received notification from Onbase that prior authorization for Mounjaro  2.5MG /0.5ML auto-injectors is required/requested.   Insurance verification completed.   The patient is insured through [employer].   Per test claim: PA required; PA submitted to above mentioned insurance via Latent Key/confirmation #/EOC [id number] Status is pending

## 2024-01-11 NOTE — Telephone Encounter (Signed)
 Pharmacy Patient Advocate Encounter  Received notification from [facility] that Prior Authorization for Mounjaro  2.5MG /0.5ML auto-injectors has been APPROVED from 01/11/2024 to 01/10/2025. Unable to obtain price due to refill too soon rejection, last fill date 01/11/2024 next available fill date01/12/2024.   PA #/Case ID/Reference #: [id number]

## 2024-01-13 ENCOUNTER — Other Ambulatory Visit: Payer: Self-pay

## 2024-01-15 DIAGNOSIS — I10 Essential (primary) hypertension: Secondary | ICD-10-CM | POA: Diagnosis not present

## 2024-01-15 DIAGNOSIS — E038 Other specified hypothyroidism: Secondary | ICD-10-CM | POA: Diagnosis not present

## 2024-01-15 DIAGNOSIS — E559 Vitamin D deficiency, unspecified: Secondary | ICD-10-CM | POA: Diagnosis not present

## 2024-01-15 DIAGNOSIS — E118 Type 2 diabetes mellitus with unspecified complications: Secondary | ICD-10-CM | POA: Diagnosis not present

## 2024-01-15 DIAGNOSIS — N1831 Chronic kidney disease, stage 3a: Secondary | ICD-10-CM | POA: Diagnosis not present

## 2024-01-17 LAB — CMP14+EGFR
ALT: 55 IU/L — ABNORMAL HIGH (ref 0–32)
AST: 44 IU/L — ABNORMAL HIGH (ref 0–40)
Albumin: 4.6 g/dL (ref 3.9–4.9)
Alkaline Phosphatase: 82 IU/L (ref 49–135)
BUN/Creatinine Ratio: 21 (ref 12–28)
BUN: 21 mg/dL (ref 8–27)
Bilirubin Total: 0.4 mg/dL (ref 0.0–1.2)
CO2: 27 mmol/L (ref 20–29)
Calcium: 9.9 mg/dL (ref 8.7–10.3)
Chloride: 96 mmol/L (ref 96–106)
Creatinine, Ser: 1.02 mg/dL — ABNORMAL HIGH (ref 0.57–1.00)
Globulin, Total: 2.2 g/dL (ref 1.5–4.5)
Glucose: 131 mg/dL — ABNORMAL HIGH (ref 70–99)
Potassium: 3.7 mmol/L (ref 3.5–5.2)
Sodium: 139 mmol/L (ref 134–144)
Total Protein: 6.8 g/dL (ref 6.0–8.5)
eGFR: 61 mL/min/1.73

## 2024-01-17 LAB — LIPID PANEL
Chol/HDL Ratio: 2.4 ratio (ref 0.0–4.4)
Cholesterol, Total: 139 mg/dL (ref 100–199)
HDL: 59 mg/dL
LDL Chol Calc (NIH): 54 mg/dL (ref 0–99)
Triglycerides: 157 mg/dL — ABNORMAL HIGH (ref 0–149)
VLDL Cholesterol Cal: 26 mg/dL (ref 5–40)

## 2024-01-17 LAB — HEMOGLOBIN A1C
Est. average glucose Bld gHb Est-mCnc: 169 mg/dL
Hgb A1c MFr Bld: 7.5 % — ABNORMAL HIGH (ref 4.8–5.6)

## 2024-01-17 LAB — MICROALBUMIN / CREATININE URINE RATIO
Creatinine, Urine: 137.1 mg/dL
Microalb/Creat Ratio: 13 mg/g{creat} (ref 0–29)
Microalbumin, Urine: 17.9 ug/mL

## 2024-01-17 LAB — TSH+FREE T4
Free T4: 1.22 ng/dL (ref 0.82–1.77)
TSH: 3.03 u[IU]/mL (ref 0.450–4.500)

## 2024-01-17 LAB — VITAMIN D 25 HYDROXY (VIT D DEFICIENCY, FRACTURES): Vit D, 25-Hydroxy: 35.2 ng/mL (ref 30.0–100.0)

## 2024-02-01 ENCOUNTER — Other Ambulatory Visit: Payer: Self-pay

## 2024-02-01 DIAGNOSIS — E118 Type 2 diabetes mellitus with unspecified complications: Secondary | ICD-10-CM

## 2024-02-01 MED ORDER — TIRZEPATIDE 2.5 MG/0.5ML ~~LOC~~ SOAJ: 2.5000 mg | SUBCUTANEOUS | 1 refills | Status: AC

## 2024-02-16 ENCOUNTER — Other Ambulatory Visit: Payer: Self-pay | Admitting: Internal Medicine

## 2024-02-16 DIAGNOSIS — E785 Hyperlipidemia, unspecified: Secondary | ICD-10-CM

## 2024-07-08 ENCOUNTER — Ambulatory Visit: Payer: Self-pay

## 2024-11-15 ENCOUNTER — Telehealth: Admitting: Adult Health
